# Patient Record
Sex: Female | Born: 1965 | Race: Black or African American | Hispanic: No | Marital: Married | Smoking: Never smoker
Health system: Southern US, Community
[De-identification: ages and names within clinical notes are randomized; demographics above are authoritative.]

## PROBLEM LIST (undated history)

## (undated) DIAGNOSIS — E0590 Thyrotoxicosis, unspecified without thyrotoxic crisis or storm: Secondary | ICD-10-CM

## (undated) DIAGNOSIS — I639 Cerebral infarction, unspecified: Secondary | ICD-10-CM

## (undated) DIAGNOSIS — K638219 Small intestinal bacterial overgrowth, unspecified: Secondary | ICD-10-CM

## (undated) DIAGNOSIS — Z9641 Presence of insulin pump (external) (internal): Secondary | ICD-10-CM

## (undated) DIAGNOSIS — N184 Chronic kidney disease, stage 4 (severe): Secondary | ICD-10-CM

## (undated) DIAGNOSIS — I10 Essential (primary) hypertension: Secondary | ICD-10-CM

## (undated) DIAGNOSIS — N189 Chronic kidney disease, unspecified: Secondary | ICD-10-CM

## (undated) DIAGNOSIS — I5189 Other ill-defined heart diseases: Secondary | ICD-10-CM

## (undated) DIAGNOSIS — R112 Nausea with vomiting, unspecified: Secondary | ICD-10-CM

## (undated) DIAGNOSIS — K219 Gastro-esophageal reflux disease without esophagitis: Secondary | ICD-10-CM

## (undated) DIAGNOSIS — E669 Obesity, unspecified: Secondary | ICD-10-CM

## (undated) DIAGNOSIS — E1140 Type 2 diabetes mellitus with diabetic neuropathy, unspecified: Secondary | ICD-10-CM

## (undated) DIAGNOSIS — E785 Hyperlipidemia, unspecified: Secondary | ICD-10-CM

## (undated) DIAGNOSIS — J329 Chronic sinusitis, unspecified: Secondary | ICD-10-CM

## (undated) DIAGNOSIS — R251 Tremor, unspecified: Secondary | ICD-10-CM

## (undated) DIAGNOSIS — E119 Type 2 diabetes mellitus without complications: Secondary | ICD-10-CM

## (undated) DIAGNOSIS — M810 Age-related osteoporosis without current pathological fracture: Secondary | ICD-10-CM

## (undated) HISTORY — DX: Type 2 diabetes mellitus without complications: E11.9

## (undated) HISTORY — PX: CATARACT EXTRACTION: SUR2

## (undated) HISTORY — DX: Cerebral infarction, unspecified: I63.9

## (undated) HISTORY — DX: Essential (primary) hypertension: I10

## (undated) HISTORY — DX: Small intestinal bacterial overgrowth, unspecified: K63.8219

## (undated) HISTORY — PX: FOOT SURGERY: SHX648

## (undated) HISTORY — DX: Other ill-defined heart diseases: I51.89

## (undated) HISTORY — DX: Chronic kidney disease, stage 4 (severe): N18.4

## (undated) HISTORY — PX: EYE SURGERY: SHX253

---

## 2004-08-06 ENCOUNTER — Ambulatory Visit: Payer: Self-pay | Admitting: Internal Medicine

## 2004-09-06 ENCOUNTER — Ambulatory Visit: Payer: Self-pay | Admitting: Internal Medicine

## 2004-10-06 ENCOUNTER — Ambulatory Visit: Payer: Self-pay | Admitting: Internal Medicine

## 2004-11-06 ENCOUNTER — Ambulatory Visit: Payer: Self-pay | Admitting: Internal Medicine

## 2004-12-07 ENCOUNTER — Ambulatory Visit: Payer: Self-pay | Admitting: Internal Medicine

## 2005-01-04 ENCOUNTER — Ambulatory Visit: Payer: Self-pay | Admitting: Internal Medicine

## 2005-02-04 ENCOUNTER — Ambulatory Visit: Payer: Self-pay | Admitting: Internal Medicine

## 2005-03-06 ENCOUNTER — Ambulatory Visit: Payer: Self-pay | Admitting: Internal Medicine

## 2005-04-06 ENCOUNTER — Ambulatory Visit: Payer: Self-pay | Admitting: Internal Medicine

## 2005-05-06 ENCOUNTER — Ambulatory Visit: Payer: Self-pay | Admitting: Internal Medicine

## 2005-06-06 ENCOUNTER — Ambulatory Visit: Payer: Self-pay | Admitting: Internal Medicine

## 2005-07-07 ENCOUNTER — Ambulatory Visit: Payer: Self-pay | Admitting: Internal Medicine

## 2005-08-06 ENCOUNTER — Ambulatory Visit: Payer: Self-pay | Admitting: Internal Medicine

## 2005-09-06 ENCOUNTER — Ambulatory Visit: Payer: Self-pay | Admitting: Internal Medicine

## 2005-10-06 ENCOUNTER — Ambulatory Visit: Payer: Self-pay | Admitting: Internal Medicine

## 2005-11-06 ENCOUNTER — Ambulatory Visit: Payer: Self-pay | Admitting: Internal Medicine

## 2005-11-06 HISTORY — PX: KIDNEY TRANSPLANT: SHX239

## 2005-11-28 ENCOUNTER — Ambulatory Visit: Payer: Self-pay

## 2005-12-07 ENCOUNTER — Ambulatory Visit: Payer: Self-pay | Admitting: Internal Medicine

## 2006-01-04 ENCOUNTER — Ambulatory Visit: Payer: Self-pay | Admitting: Internal Medicine

## 2006-02-04 ENCOUNTER — Ambulatory Visit: Payer: Self-pay | Admitting: Internal Medicine

## 2006-03-06 ENCOUNTER — Ambulatory Visit: Payer: Self-pay | Admitting: Internal Medicine

## 2006-04-06 ENCOUNTER — Ambulatory Visit: Payer: Self-pay | Admitting: Internal Medicine

## 2006-05-06 ENCOUNTER — Ambulatory Visit: Payer: Self-pay | Admitting: Internal Medicine

## 2006-06-06 ENCOUNTER — Ambulatory Visit: Payer: Self-pay | Admitting: Internal Medicine

## 2006-07-07 ENCOUNTER — Ambulatory Visit: Payer: Self-pay | Admitting: Internal Medicine

## 2006-08-06 ENCOUNTER — Ambulatory Visit: Payer: Self-pay | Admitting: Internal Medicine

## 2006-09-06 ENCOUNTER — Ambulatory Visit: Payer: Self-pay | Admitting: Internal Medicine

## 2006-10-06 ENCOUNTER — Ambulatory Visit: Payer: Self-pay | Admitting: Internal Medicine

## 2006-11-06 ENCOUNTER — Ambulatory Visit: Payer: Self-pay | Admitting: Internal Medicine

## 2007-03-28 ENCOUNTER — Ambulatory Visit: Payer: Self-pay

## 2007-04-29 ENCOUNTER — Emergency Department: Payer: Self-pay | Admitting: Emergency Medicine

## 2008-03-31 ENCOUNTER — Ambulatory Visit: Payer: Self-pay

## 2008-10-30 ENCOUNTER — Emergency Department: Payer: Self-pay

## 2009-04-01 ENCOUNTER — Ambulatory Visit: Payer: Self-pay

## 2009-05-02 ENCOUNTER — Ambulatory Visit: Payer: Self-pay

## 2009-07-22 ENCOUNTER — Ambulatory Visit: Payer: Self-pay | Admitting: Ophthalmology

## 2009-07-28 ENCOUNTER — Ambulatory Visit: Payer: Self-pay | Admitting: Ophthalmology

## 2010-02-03 ENCOUNTER — Other Ambulatory Visit: Payer: Self-pay

## 2010-02-04 ENCOUNTER — Other Ambulatory Visit: Payer: Self-pay

## 2010-07-15 ENCOUNTER — Other Ambulatory Visit: Payer: Self-pay

## 2010-11-12 ENCOUNTER — Other Ambulatory Visit: Payer: Self-pay

## 2010-11-26 ENCOUNTER — Other Ambulatory Visit: Payer: Self-pay

## 2010-12-07 ENCOUNTER — Other Ambulatory Visit: Payer: Self-pay

## 2011-01-05 ENCOUNTER — Other Ambulatory Visit: Payer: Self-pay

## 2011-04-06 ENCOUNTER — Ambulatory Visit: Payer: Self-pay

## 2011-05-13 ENCOUNTER — Other Ambulatory Visit: Payer: Self-pay

## 2011-06-07 ENCOUNTER — Other Ambulatory Visit: Payer: Self-pay

## 2011-08-19 ENCOUNTER — Other Ambulatory Visit: Payer: Self-pay

## 2011-09-07 ENCOUNTER — Other Ambulatory Visit: Payer: Self-pay

## 2011-11-06 ENCOUNTER — Other Ambulatory Visit: Payer: Self-pay

## 2011-11-07 ENCOUNTER — Other Ambulatory Visit: Payer: Self-pay

## 2011-12-30 ENCOUNTER — Other Ambulatory Visit: Payer: Self-pay

## 2011-12-30 LAB — CBC WITH DIFFERENTIAL/PLATELET
Basophil #: 0 10*3/uL (ref 0.0–0.1)
Eosinophil #: 0.2 10*3/uL (ref 0.0–0.7)
Lymphocyte %: 29.4 %
MCHC: 32.7 g/dL (ref 32.0–36.0)
Neutrophil %: 58.9 %
Platelet: 182 10*3/uL (ref 150–440)
RDW: 14.5 % (ref 11.5–14.5)

## 2011-12-30 LAB — BASIC METABOLIC PANEL
Anion Gap: 9 (ref 7–16)
BUN: 33 mg/dL — ABNORMAL HIGH (ref 7–18)
Co2: 25 mmol/L (ref 21–32)
Creatinine: 1.36 mg/dL — ABNORMAL HIGH (ref 0.60–1.30)
EGFR (African American): 54 — ABNORMAL LOW
EGFR (Non-African Amer.): 44 — ABNORMAL LOW
Glucose: 111 mg/dL — ABNORMAL HIGH (ref 65–99)
Osmolality: 285 (ref 275–301)
Sodium: 139 mmol/L (ref 136–145)

## 2011-12-30 LAB — MAGNESIUM: Magnesium: 1.5 mg/dL — ABNORMAL LOW

## 2012-01-05 ENCOUNTER — Other Ambulatory Visit: Payer: Self-pay

## 2012-02-19 ENCOUNTER — Ambulatory Visit: Payer: Self-pay | Admitting: Unknown Physician Specialty

## 2012-02-24 ENCOUNTER — Other Ambulatory Visit: Payer: Self-pay

## 2012-02-24 LAB — CBC WITH DIFFERENTIAL/PLATELET
Basophil %: 0.4 %
Eosinophil #: 0.2 10*3/uL (ref 0.0–0.7)
Eosinophil %: 4 %
Lymphocyte #: 1.2 10*3/uL (ref 1.0–3.6)
MCH: 27.7 pg (ref 26.0–34.0)
MCHC: 31.3 g/dL — ABNORMAL LOW (ref 32.0–36.0)
MCV: 88 fL (ref 80–100)
Monocyte #: 0.3 x10 3/mm (ref 0.2–0.9)
Neutrophil %: 68 %
Platelet: 164 10*3/uL (ref 150–440)
RBC: 3.95 10*6/uL (ref 3.80–5.20)
RDW: 13.6 % (ref 11.5–14.5)
WBC: 5.5 10*3/uL (ref 3.6–11.0)

## 2012-02-24 LAB — BASIC METABOLIC PANEL
Anion Gap: 9 (ref 7–16)
Chloride: 101 mmol/L (ref 98–107)
EGFR (African American): 53 — ABNORMAL LOW
EGFR (Non-African Amer.): 46 — ABNORMAL LOW
Glucose: 158 mg/dL — ABNORMAL HIGH (ref 65–99)
Potassium: 4.6 mmol/L (ref 3.5–5.1)

## 2012-02-24 LAB — PHOSPHORUS: Phosphorus: 3.3 mg/dL (ref 2.5–4.9)

## 2012-02-24 LAB — MAGNESIUM: Magnesium: 1.7 mg/dL — ABNORMAL LOW

## 2012-03-06 ENCOUNTER — Ambulatory Visit: Payer: Self-pay | Admitting: Unknown Physician Specialty

## 2012-03-06 ENCOUNTER — Other Ambulatory Visit: Payer: Self-pay

## 2012-04-06 ENCOUNTER — Ambulatory Visit: Payer: Self-pay | Admitting: Unknown Physician Specialty

## 2012-04-27 LAB — BASIC METABOLIC PANEL
BUN: 27 mg/dL — ABNORMAL HIGH (ref 7–18)
Calcium, Total: 9 mg/dL (ref 8.5–10.1)
Chloride: 105 mmol/L (ref 98–107)
Co2: 24 mmol/L (ref 21–32)
Creatinine: 1.36 mg/dL — ABNORMAL HIGH (ref 0.60–1.30)
EGFR (African American): 54 — ABNORMAL LOW
EGFR (Non-African Amer.): 47 — ABNORMAL LOW
Glucose: 102 mg/dL — ABNORMAL HIGH (ref 65–99)
Osmolality: 285 (ref 275–301)
Potassium: 4.6 mmol/L (ref 3.5–5.1)

## 2012-04-27 LAB — CBC WITH DIFFERENTIAL/PLATELET
Basophil #: 0 10*3/uL (ref 0.0–0.1)
Basophil %: 0.5 %
Eosinophil %: 3.7 %
HGB: 11.4 g/dL — ABNORMAL LOW (ref 12.0–16.0)
Lymphocyte #: 1.5 10*3/uL (ref 1.0–3.6)
Lymphocyte %: 18.4 %
MCH: 28.3 pg (ref 26.0–34.0)
Monocyte #: 0.5 x10 3/mm (ref 0.2–0.9)
Monocyte %: 6.3 %
Neutrophil %: 71.1 %
Platelet: 182 10*3/uL (ref 150–440)
RBC: 4.03 10*6/uL (ref 3.80–5.20)
WBC: 8.2 10*3/uL (ref 3.6–11.0)

## 2012-04-27 LAB — PHOSPHORUS: Phosphorus: 3.3 mg/dL (ref 2.5–4.9)

## 2012-04-27 LAB — MAGNESIUM: Magnesium: 1.4 mg/dL — ABNORMAL LOW

## 2012-05-06 ENCOUNTER — Ambulatory Visit: Payer: Self-pay | Admitting: Unknown Physician Specialty

## 2012-06-05 ENCOUNTER — Ambulatory Visit: Payer: Self-pay

## 2012-06-18 ENCOUNTER — Ambulatory Visit: Payer: Self-pay

## 2012-08-10 ENCOUNTER — Other Ambulatory Visit: Payer: Self-pay

## 2012-08-10 LAB — BASIC METABOLIC PANEL
Co2: 25 mmol/L (ref 21–32)
Creatinine: 1.65 mg/dL — ABNORMAL HIGH (ref 0.60–1.30)
EGFR (African American): 43 — ABNORMAL LOW
EGFR (Non-African Amer.): 37 — ABNORMAL LOW
Glucose: 129 mg/dL — ABNORMAL HIGH (ref 65–99)
Potassium: 4.5 mmol/L (ref 3.5–5.1)
Sodium: 142 mmol/L (ref 136–145)

## 2012-08-10 LAB — CBC WITH DIFFERENTIAL/PLATELET
Basophil #: 0.1 10*3/uL (ref 0.0–0.1)
Basophil %: 1 %
Eosinophil #: 0.2 10*3/uL (ref 0.0–0.7)
HGB: 11.3 g/dL — ABNORMAL LOW (ref 12.0–16.0)
Lymphocyte %: 26.9 %
MCHC: 32.9 g/dL (ref 32.0–36.0)
Neutrophil %: 62 %
RDW: 13.4 % (ref 11.5–14.5)

## 2012-08-10 LAB — PHOSPHORUS: Phosphorus: 3.2 mg/dL (ref 2.5–4.9)

## 2012-08-10 LAB — MAGNESIUM: Magnesium: 1.5 mg/dL — ABNORMAL LOW

## 2012-08-17 ENCOUNTER — Other Ambulatory Visit: Payer: Self-pay

## 2012-08-17 LAB — BASIC METABOLIC PANEL
Calcium, Total: 9.1 mg/dL (ref 8.5–10.1)
Chloride: 103 mmol/L (ref 98–107)
Co2: 25 mmol/L (ref 21–32)
Osmolality: 275 (ref 275–301)
Potassium: 4.5 mmol/L (ref 3.5–5.1)
Sodium: 136 mmol/L (ref 136–145)

## 2012-09-06 ENCOUNTER — Other Ambulatory Visit: Payer: Self-pay

## 2012-10-17 ENCOUNTER — Other Ambulatory Visit: Payer: Self-pay | Admitting: Nephrology

## 2012-10-17 LAB — CBC WITH DIFFERENTIAL/PLATELET
Basophil #: 0 10*3/uL (ref 0.0–0.1)
Eosinophil %: 2.3 %
HCT: 35.7 % (ref 35.0–47.0)
Lymphocyte #: 1.3 10*3/uL (ref 1.0–3.6)
MCH: 28.7 pg (ref 26.0–34.0)
MCHC: 33.1 g/dL (ref 32.0–36.0)
MCV: 87 fL (ref 80–100)
Monocyte #: 0.3 x10 3/mm (ref 0.2–0.9)
Monocyte %: 6.4 %
Neutrophil %: 66.6 %
Platelet: 190 10*3/uL (ref 150–440)
RDW: 14.2 % (ref 11.5–14.5)

## 2012-10-17 LAB — COMPREHENSIVE METABOLIC PANEL
Bilirubin,Total: 0.8 mg/dL (ref 0.2–1.0)
Calcium, Total: 9.2 mg/dL (ref 8.5–10.1)
Chloride: 103 mmol/L (ref 98–107)
Co2: 23 mmol/L (ref 21–32)
Creatinine: 1.51 mg/dL — ABNORMAL HIGH (ref 0.60–1.30)
EGFR (African American): 48 — ABNORMAL LOW
EGFR (Non-African Amer.): 41 — ABNORMAL LOW
Osmolality: 280 (ref 275–301)
SGPT (ALT): 20 U/L (ref 12–78)
Sodium: 134 mmol/L — ABNORMAL LOW (ref 136–145)

## 2012-10-17 LAB — GAMMA GT: GGT: 41 U/L (ref 5–85)

## 2012-10-17 LAB — LIPID PANEL: Triglycerides: 78 mg/dL (ref 0–200)

## 2012-11-06 ENCOUNTER — Other Ambulatory Visit: Payer: Self-pay | Admitting: Nephrology

## 2012-12-03 LAB — CBC WITH DIFFERENTIAL/PLATELET
Basophil #: 0.1 10*3/uL (ref 0.0–0.1)
Eosinophil %: 1.8 %
HGB: 11.9 g/dL — ABNORMAL LOW (ref 12.0–16.0)
Lymphocyte %: 20.6 %
MCV: 87 fL (ref 80–100)
Monocyte #: 0.5 x10 3/mm (ref 0.2–0.9)
Monocyte %: 5.8 %
Neutrophil #: 5.5 10*3/uL (ref 1.4–6.5)
Neutrophil %: 70.7 %
Platelet: 205 10*3/uL (ref 150–440)
RBC: 4.23 10*6/uL (ref 3.80–5.20)

## 2012-12-03 LAB — BASIC METABOLIC PANEL
Anion Gap: 9 (ref 7–16)
Calcium, Total: 8.7 mg/dL (ref 8.5–10.1)
Chloride: 103 mmol/L (ref 98–107)
Creatinine: 1.47 mg/dL — ABNORMAL HIGH (ref 0.60–1.30)
EGFR (African American): 49 — ABNORMAL LOW
EGFR (Non-African Amer.): 42 — ABNORMAL LOW
Potassium: 4.3 mmol/L (ref 3.5–5.1)
Sodium: 135 mmol/L — ABNORMAL LOW (ref 136–145)

## 2012-12-03 LAB — MAGNESIUM: Magnesium: 1.6 mg/dL — ABNORMAL LOW

## 2012-12-07 ENCOUNTER — Other Ambulatory Visit: Payer: Self-pay | Admitting: Nephrology

## 2013-03-01 ENCOUNTER — Other Ambulatory Visit: Payer: Self-pay | Admitting: Nephrology

## 2013-03-01 LAB — CBC WITH DIFFERENTIAL/PLATELET
Basophil #: 0 10*3/uL (ref 0.0–0.1)
Eosinophil #: 0.1 10*3/uL (ref 0.0–0.7)
Eosinophil %: 2.4 %
HCT: 35.8 % (ref 35.0–47.0)
HGB: 11.6 g/dL — ABNORMAL LOW (ref 12.0–16.0)
Lymphocyte #: 1.3 10*3/uL (ref 1.0–3.6)
Lymphocyte %: 21.1 %
MCH: 27.9 pg (ref 26.0–34.0)
MCV: 86 fL (ref 80–100)
Monocyte #: 0.4 x10 3/mm (ref 0.2–0.9)
Monocyte %: 6.1 %
Neutrophil #: 4.3 10*3/uL (ref 1.4–6.5)
Neutrophil %: 69.9 %
RBC: 4.15 10*6/uL (ref 3.80–5.20)
RDW: 14 % (ref 11.5–14.5)
WBC: 6.2 10*3/uL (ref 3.6–11.0)

## 2013-03-01 LAB — COMPREHENSIVE METABOLIC PANEL
Alkaline Phosphatase: 97 U/L (ref 50–136)
Anion Gap: 6 — ABNORMAL LOW (ref 7–16)
BUN: 40 mg/dL — ABNORMAL HIGH (ref 7–18)
Calcium, Total: 9 mg/dL (ref 8.5–10.1)
Co2: 26 mmol/L (ref 21–32)
Creatinine: 1.7 mg/dL — ABNORMAL HIGH (ref 0.60–1.30)
EGFR (Non-African Amer.): 35 — ABNORMAL LOW
Glucose: 107 mg/dL — ABNORMAL HIGH (ref 65–99)
Osmolality: 282 (ref 275–301)
SGPT (ALT): 19 U/L (ref 12–78)

## 2013-03-01 LAB — GAMMA GT: GGT: 41 U/L (ref 5–85)

## 2013-03-01 LAB — LIPID PANEL: Ldl Cholesterol, Calc: 121 mg/dL — ABNORMAL HIGH (ref 0–100)

## 2013-03-22 ENCOUNTER — Other Ambulatory Visit: Payer: Self-pay | Admitting: Nephrology

## 2013-03-22 LAB — BASIC METABOLIC PANEL
Anion Gap: 6 — ABNORMAL LOW (ref 7–16)
BUN: 31 mg/dL — ABNORMAL HIGH (ref 7–18)
Co2: 26 mmol/L (ref 21–32)
EGFR (Non-African Amer.): 36 — ABNORMAL LOW
Glucose: 104 mg/dL — ABNORMAL HIGH (ref 65–99)
Potassium: 4.6 mmol/L (ref 3.5–5.1)
Sodium: 136 mmol/L (ref 136–145)

## 2013-03-22 LAB — CBC WITH DIFFERENTIAL/PLATELET
Basophil #: 0.1 10*3/uL (ref 0.0–0.1)
Basophil %: 1.1 %
Eosinophil #: 0.1 10*3/uL (ref 0.0–0.7)
Eosinophil %: 2.4 %
HCT: 33.2 % — ABNORMAL LOW (ref 35.0–47.0)
HGB: 10.9 g/dL — ABNORMAL LOW (ref 12.0–16.0)
Lymphocyte #: 1.2 10*3/uL (ref 1.0–3.6)
Lymphocyte %: 19.4 %
MCHC: 32.8 g/dL (ref 32.0–36.0)
MCV: 85 fL (ref 80–100)
Neutrophil #: 4.4 10*3/uL (ref 1.4–6.5)
Neutrophil %: 70.3 %
Platelet: 169 10*3/uL (ref 150–440)
RBC: 3.9 10*6/uL (ref 3.80–5.20)
RDW: 13.6 % (ref 11.5–14.5)

## 2013-03-22 LAB — PHOSPHORUS: Phosphorus: 3.1 mg/dL (ref 2.5–4.9)

## 2013-04-06 ENCOUNTER — Other Ambulatory Visit: Payer: Self-pay | Admitting: Nephrology

## 2013-04-12 ENCOUNTER — Other Ambulatory Visit: Payer: Self-pay | Admitting: Nephrology

## 2013-04-12 LAB — CBC WITH DIFFERENTIAL/PLATELET
Basophil %: 1.2 %
Eosinophil %: 2.2 %
Lymphocyte #: 1.3 10*3/uL (ref 1.0–3.6)
MCV: 85 fL (ref 80–100)
Monocyte #: 0.4 x10 3/mm (ref 0.2–0.9)
Neutrophil %: 66.5 %
RBC: 4.19 10*6/uL (ref 3.80–5.20)
RDW: 13.7 % (ref 11.5–14.5)
WBC: 5.4 10*3/uL (ref 3.6–11.0)

## 2013-04-12 LAB — LIPID PANEL
Ldl Cholesterol, Calc: 127 mg/dL — ABNORMAL HIGH (ref 0–100)
Triglycerides: 67 mg/dL (ref 0–200)

## 2013-04-12 LAB — BASIC METABOLIC PANEL
BUN: 34 mg/dL — ABNORMAL HIGH (ref 7–18)
Chloride: 106 mmol/L (ref 98–107)
Co2: 25 mmol/L (ref 21–32)
EGFR (Non-African Amer.): 40 — ABNORMAL LOW
Sodium: 136 mmol/L (ref 136–145)

## 2013-04-12 LAB — MAGNESIUM: Magnesium: 1.5 mg/dL — ABNORMAL LOW

## 2013-04-12 LAB — HEPATIC FUNCTION PANEL A (ARMC)
Bilirubin,Total: 0.7 mg/dL (ref 0.2–1.0)
SGOT(AST): 13 U/L — ABNORMAL LOW (ref 15–37)
Total Protein: 8.1 g/dL (ref 6.4–8.2)

## 2013-05-31 ENCOUNTER — Other Ambulatory Visit: Payer: Self-pay | Admitting: Nephrology

## 2013-05-31 LAB — CBC WITH DIFFERENTIAL/PLATELET
Basophil %: 0.7 %
Eosinophil #: 0.2 10*3/uL (ref 0.0–0.7)
Eosinophil %: 3.3 %
Lymphocyte #: 1.5 10*3/uL (ref 1.0–3.6)
Lymphocyte %: 28.5 %
MCH: 26.1 pg (ref 26.0–34.0)
Monocyte %: 7.9 %
Neutrophil #: 3 10*3/uL (ref 1.4–6.5)
Neutrophil %: 59.6 %
RBC: 3.91 10*6/uL (ref 3.80–5.20)
RDW: 12.8 % (ref 11.5–14.5)
WBC: 5.1 10*3/uL (ref 3.6–11.0)

## 2013-05-31 LAB — BASIC METABOLIC PANEL
Anion Gap: 6 — ABNORMAL LOW (ref 7–16)
Chloride: 102 mmol/L (ref 98–107)
EGFR (African American): 50 — ABNORMAL LOW
Osmolality: 272 (ref 275–301)
Sodium: 134 mmol/L — ABNORMAL LOW (ref 136–145)

## 2013-05-31 LAB — PHOSPHORUS: Phosphorus: 3.5 mg/dL (ref 2.5–4.9)

## 2013-05-31 LAB — MAGNESIUM: Magnesium: 1.5 mg/dL — ABNORMAL LOW

## 2013-06-06 ENCOUNTER — Other Ambulatory Visit: Payer: Self-pay | Admitting: Nephrology

## 2013-07-07 ENCOUNTER — Other Ambulatory Visit: Payer: Self-pay | Admitting: Nephrology

## 2013-07-07 LAB — HEPATIC FUNCTION PANEL A (ARMC)
Alkaline Phosphatase: 120 U/L (ref 50–136)
SGOT(AST): 19 U/L (ref 15–37)
SGPT (ALT): 19 U/L (ref 12–78)
Total Protein: 7.9 g/dL (ref 6.4–8.2)

## 2013-07-07 LAB — CBC WITH DIFFERENTIAL/PLATELET
Basophil #: 0 10*3/uL (ref 0.0–0.1)
Basophil %: 0.6 %
Eosinophil #: 0.1 10*3/uL (ref 0.0–0.7)
Lymphocyte #: 1.4 10*3/uL (ref 1.0–3.6)
Lymphocyte %: 27.5 %
MCV: 85 fL (ref 80–100)
Monocyte #: 0.4 x10 3/mm (ref 0.2–0.9)
Monocyte %: 7.8 %
Platelet: 189 10*3/uL (ref 150–440)
RBC: 3.98 10*6/uL (ref 3.80–5.20)
RDW: 13.9 % (ref 11.5–14.5)

## 2013-07-07 LAB — LIPID PANEL
Cholesterol: 201 mg/dL — ABNORMAL HIGH (ref 0–200)
Ldl Cholesterol, Calc: 116 mg/dL — ABNORMAL HIGH (ref 0–100)
Triglycerides: 103 mg/dL (ref 0–200)

## 2013-07-07 LAB — BASIC METABOLIC PANEL
Anion Gap: 4 — ABNORMAL LOW (ref 7–16)
Calcium, Total: 9 mg/dL (ref 8.5–10.1)
Creatinine: 1.52 mg/dL — ABNORMAL HIGH (ref 0.60–1.30)
EGFR (African American): 47 — ABNORMAL LOW
EGFR (Non-African Amer.): 40 — ABNORMAL LOW
Osmolality: 271 (ref 275–301)
Potassium: 4.5 mmol/L (ref 3.5–5.1)
Sodium: 132 mmol/L — ABNORMAL LOW (ref 136–145)

## 2013-07-07 LAB — MAGNESIUM: Magnesium: 1.5 mg/dL — ABNORMAL LOW

## 2013-07-07 LAB — PHOSPHORUS: Phosphorus: 3.2 mg/dL (ref 2.5–4.9)

## 2013-07-07 LAB — GAMMA GT: GGT: 38 U/L (ref 5–85)

## 2013-07-09 ENCOUNTER — Ambulatory Visit: Payer: Self-pay

## 2013-08-06 ENCOUNTER — Other Ambulatory Visit: Payer: Self-pay | Admitting: Nephrology

## 2013-08-30 ENCOUNTER — Other Ambulatory Visit: Payer: Self-pay | Admitting: Nephrology

## 2013-08-30 LAB — CBC WITH DIFFERENTIAL/PLATELET
Basophil #: 0.1 10*3/uL (ref 0.0–0.1)
Basophil %: 1.2 %
HGB: 11.4 g/dL — ABNORMAL LOW (ref 12.0–16.0)
Lymphocyte %: 25.1 %
MCH: 27.9 pg (ref 26.0–34.0)
Monocyte %: 7.3 %
Platelet: 213 10*3/uL (ref 150–440)
RBC: 4.09 10*6/uL (ref 3.80–5.20)

## 2013-08-30 LAB — BASIC METABOLIC PANEL
Anion Gap: 4 — ABNORMAL LOW (ref 7–16)
Calcium, Total: 9 mg/dL (ref 8.5–10.1)
Chloride: 103 mmol/L (ref 98–107)
Co2: 26 mmol/L (ref 21–32)
Creatinine: 1.63 mg/dL — ABNORMAL HIGH (ref 0.60–1.30)
EGFR (African American): 43 — ABNORMAL LOW
EGFR (Non-African Amer.): 37 — ABNORMAL LOW
Osmolality: 276 (ref 275–301)
Potassium: 4.2 mmol/L (ref 3.5–5.1)
Sodium: 133 mmol/L — ABNORMAL LOW (ref 136–145)

## 2013-08-30 LAB — PHOSPHORUS: Phosphorus: 3.8 mg/dL (ref 2.5–4.9)

## 2013-09-27 ENCOUNTER — Other Ambulatory Visit: Payer: Self-pay | Admitting: Nephrology

## 2013-09-27 LAB — CBC WITH DIFFERENTIAL/PLATELET
Basophil %: 0.6 %
Eosinophil #: 0.2 10*3/uL (ref 0.0–0.7)
Eosinophil %: 2.4 %
HGB: 11.1 g/dL — ABNORMAL LOW (ref 12.0–16.0)
Lymphocyte #: 1.5 10*3/uL (ref 1.0–3.6)
Lymphocyte %: 21.6 %
Monocyte %: 7.4 %
Neutrophil %: 68 %

## 2013-09-27 LAB — BASIC METABOLIC PANEL
Anion Gap: 10 (ref 7–16)
Chloride: 103 mmol/L (ref 98–107)
Co2: 21 mmol/L (ref 21–32)
EGFR (African American): 46 — ABNORMAL LOW
Osmolality: 278 (ref 275–301)
Sodium: 134 mmol/L — ABNORMAL LOW (ref 136–145)

## 2013-10-06 ENCOUNTER — Other Ambulatory Visit: Payer: Self-pay | Admitting: Nephrology

## 2013-10-25 ENCOUNTER — Other Ambulatory Visit: Payer: Self-pay | Admitting: Nephrology

## 2013-10-25 LAB — CBC WITH DIFFERENTIAL/PLATELET
Basophil #: 0 10*3/uL (ref 0.0–0.1)
Eosinophil %: 1.7 %
HCT: 33.7 % — ABNORMAL LOW (ref 35.0–47.0)
HGB: 11.1 g/dL — ABNORMAL LOW (ref 12.0–16.0)
Lymphocyte %: 20 %
MCH: 27.9 pg (ref 26.0–34.0)
MCV: 85 fL (ref 80–100)
Monocyte #: 0.3 x10 3/mm (ref 0.2–0.9)
Monocyte %: 5.9 %
Neutrophil #: 4.3 10*3/uL (ref 1.4–6.5)
Neutrophil %: 71.9 %
Platelet: 166 10*3/uL (ref 150–440)
RDW: 13.7 % (ref 11.5–14.5)
WBC: 6 10*3/uL (ref 3.6–11.0)

## 2013-10-25 LAB — BASIC METABOLIC PANEL
Chloride: 101 mmol/L (ref 98–107)
Creatinine: 1.63 mg/dL — ABNORMAL HIGH (ref 0.60–1.30)
EGFR (African American): 43 — ABNORMAL LOW
EGFR (Non-African Amer.): 37 — ABNORMAL LOW
Glucose: 218 mg/dL — ABNORMAL HIGH (ref 65–99)
Osmolality: 281 (ref 275–301)

## 2013-10-25 LAB — HEPATIC FUNCTION PANEL A (ARMC)
Albumin: 4.1 g/dL (ref 3.4–5.0)
Bilirubin,Total: 0.7 mg/dL (ref 0.2–1.0)
SGOT(AST): 14 U/L — ABNORMAL LOW (ref 15–37)
SGPT (ALT): 17 U/L (ref 12–78)
Total Protein: 7.8 g/dL (ref 6.4–8.2)

## 2013-10-25 LAB — LIPID PANEL: HDL Cholesterol: 65 mg/dL — ABNORMAL HIGH (ref 40–60)

## 2013-10-25 LAB — MAGNESIUM: Magnesium: 1.4 mg/dL — ABNORMAL LOW

## 2013-11-15 ENCOUNTER — Other Ambulatory Visit: Payer: Self-pay | Admitting: Nephrology

## 2013-11-15 LAB — CBC WITH DIFFERENTIAL/PLATELET
BASOS ABS: 0 10*3/uL (ref 0.0–0.1)
Basophil %: 0.6 %
EOS PCT: 2 %
Eosinophil #: 0.1 10*3/uL (ref 0.0–0.7)
HCT: 34.6 % — AB (ref 35.0–47.0)
HGB: 11.5 g/dL — ABNORMAL LOW (ref 12.0–16.0)
LYMPHS ABS: 1.4 10*3/uL (ref 1.0–3.6)
Lymphocyte %: 24.9 %
MCH: 28.3 pg (ref 26.0–34.0)
MCHC: 33.2 g/dL (ref 32.0–36.0)
MCV: 85 fL (ref 80–100)
Monocyte #: 0.5 x10 3/mm (ref 0.2–0.9)
Monocyte %: 8.7 %
Neutrophil #: 3.6 10*3/uL (ref 1.4–6.5)
Neutrophil %: 63.8 %
Platelet: 201 10*3/uL (ref 150–440)
RBC: 4.06 10*6/uL (ref 3.80–5.20)
RDW: 14.1 % (ref 11.5–14.5)
WBC: 5.7 10*3/uL (ref 3.6–11.0)

## 2013-11-15 LAB — BASIC METABOLIC PANEL
Anion Gap: 6 — ABNORMAL LOW (ref 7–16)
BUN: 31 mg/dL — AB (ref 7–18)
CHLORIDE: 105 mmol/L (ref 98–107)
CREATININE: 1.71 mg/dL — AB (ref 0.60–1.30)
Calcium, Total: 9.2 mg/dL (ref 8.5–10.1)
Co2: 24 mmol/L (ref 21–32)
EGFR (African American): 41 — ABNORMAL LOW
EGFR (Non-African Amer.): 35 — ABNORMAL LOW
GLUCOSE: 129 mg/dL — AB (ref 65–99)
Osmolality: 278 (ref 275–301)
POTASSIUM: 4.6 mmol/L (ref 3.5–5.1)
Sodium: 135 mmol/L — ABNORMAL LOW (ref 136–145)

## 2013-11-29 LAB — BASIC METABOLIC PANEL
Anion Gap: 4 — ABNORMAL LOW (ref 7–16)
BUN: 33 mg/dL — ABNORMAL HIGH (ref 7–18)
CHLORIDE: 103 mmol/L (ref 98–107)
CREATININE: 1.67 mg/dL — AB (ref 0.60–1.30)
Calcium, Total: 8.9 mg/dL (ref 8.5–10.1)
Co2: 27 mmol/L (ref 21–32)
EGFR (Non-African Amer.): 36 — ABNORMAL LOW
GFR CALC AF AMER: 42 — AB
Glucose: 77 mg/dL (ref 65–99)
OSMOLALITY: 274 (ref 275–301)
Potassium: 4.4 mmol/L (ref 3.5–5.1)
SODIUM: 134 mmol/L — AB (ref 136–145)

## 2013-11-29 LAB — CBC WITH DIFFERENTIAL/PLATELET
BASOS PCT: 0.9 %
Basophil #: 0.1 10*3/uL (ref 0.0–0.1)
EOS ABS: 0.2 10*3/uL (ref 0.0–0.7)
Eosinophil %: 3.2 %
HCT: 34.1 % — AB (ref 35.0–47.0)
HGB: 11.1 g/dL — AB (ref 12.0–16.0)
LYMPHS PCT: 28.5 %
Lymphocyte #: 1.6 10*3/uL (ref 1.0–3.6)
MCH: 27.7 pg (ref 26.0–34.0)
MCHC: 32.4 g/dL (ref 32.0–36.0)
MCV: 86 fL (ref 80–100)
Monocyte #: 0.4 x10 3/mm (ref 0.2–0.9)
Monocyte %: 8 %
NEUTROS ABS: 3.2 10*3/uL (ref 1.4–6.5)
NEUTROS PCT: 59.4 %
Platelet: 192 10*3/uL (ref 150–440)
RBC: 3.99 10*6/uL (ref 3.80–5.20)
RDW: 14 % (ref 11.5–14.5)
WBC: 5.5 10*3/uL (ref 3.6–11.0)

## 2013-11-29 LAB — PHOSPHORUS: Phosphorus: 3.5 mg/dL (ref 2.5–4.9)

## 2013-11-29 LAB — MAGNESIUM: Magnesium: 1.4 mg/dL — ABNORMAL LOW

## 2013-12-07 ENCOUNTER — Other Ambulatory Visit: Payer: Self-pay | Admitting: Nephrology

## 2013-12-20 LAB — CBC WITH DIFFERENTIAL/PLATELET
BASOS PCT: 0.8 %
Basophil #: 0 10*3/uL (ref 0.0–0.1)
EOS PCT: 2.6 %
Eosinophil #: 0.2 10*3/uL (ref 0.0–0.7)
HCT: 33.4 % — ABNORMAL LOW (ref 35.0–47.0)
HGB: 10.9 g/dL — ABNORMAL LOW (ref 12.0–16.0)
LYMPHS PCT: 23.5 %
Lymphocyte #: 1.5 10*3/uL (ref 1.0–3.6)
MCH: 28.1 pg (ref 26.0–34.0)
MCHC: 32.7 g/dL (ref 32.0–36.0)
MCV: 86 fL (ref 80–100)
Monocyte #: 0.5 x10 3/mm (ref 0.2–0.9)
Monocyte %: 8.7 %
NEUTROS ABS: 4 10*3/uL (ref 1.4–6.5)
Neutrophil %: 64.4 %
Platelet: 172 10*3/uL (ref 150–440)
RBC: 3.9 10*6/uL (ref 3.80–5.20)
RDW: 14.2 % (ref 11.5–14.5)
WBC: 6.2 10*3/uL (ref 3.6–11.0)

## 2013-12-20 LAB — BASIC METABOLIC PANEL
Anion Gap: 5 — ABNORMAL LOW (ref 7–16)
BUN: 37 mg/dL — ABNORMAL HIGH (ref 7–18)
Calcium, Total: 9 mg/dL (ref 8.5–10.1)
Chloride: 103 mmol/L (ref 98–107)
Co2: 25 mmol/L (ref 21–32)
Creatinine: 1.77 mg/dL — ABNORMAL HIGH (ref 0.60–1.30)
EGFR (African American): 39 — ABNORMAL LOW
EGFR (Non-African Amer.): 33 — ABNORMAL LOW
Glucose: 101 mg/dL — ABNORMAL HIGH (ref 65–99)
Osmolality: 275 (ref 275–301)
Potassium: 4.6 mmol/L (ref 3.5–5.1)
Sodium: 133 mmol/L — ABNORMAL LOW (ref 136–145)

## 2013-12-20 LAB — MAGNESIUM: MAGNESIUM: 1.5 mg/dL — AB

## 2013-12-20 LAB — PHOSPHORUS: Phosphorus: 3.8 mg/dL (ref 2.5–4.9)

## 2014-01-04 ENCOUNTER — Other Ambulatory Visit: Payer: Self-pay | Admitting: Nephrology

## 2014-02-21 ENCOUNTER — Other Ambulatory Visit: Payer: Self-pay | Admitting: Nephrology

## 2014-02-21 LAB — CBC WITH DIFFERENTIAL/PLATELET
BASOS ABS: 0 10*3/uL (ref 0.0–0.1)
BASOS PCT: 0.4 %
EOS ABS: 0.1 10*3/uL (ref 0.0–0.7)
Eosinophil %: 1.8 %
HCT: 36.6 % (ref 35.0–47.0)
HGB: 11.4 g/dL — ABNORMAL LOW (ref 12.0–16.0)
Lymphocyte #: 1.2 10*3/uL (ref 1.0–3.6)
Lymphocyte %: 16.7 %
MCH: 26.9 pg (ref 26.0–34.0)
MCHC: 31.1 g/dL — ABNORMAL LOW (ref 32.0–36.0)
MCV: 86 fL (ref 80–100)
MONO ABS: 0.5 x10 3/mm (ref 0.2–0.9)
Monocyte %: 6.3 %
NEUTROS PCT: 74.8 %
Neutrophil #: 5.3 10*3/uL (ref 1.4–6.5)
PLATELETS: 172 10*3/uL (ref 150–440)
RBC: 4.24 10*6/uL (ref 3.80–5.20)
RDW: 13.7 % (ref 11.5–14.5)
WBC: 7.2 10*3/uL (ref 3.6–11.0)

## 2014-02-21 LAB — BASIC METABOLIC PANEL
Anion Gap: 8 (ref 7–16)
BUN: 24 mg/dL — ABNORMAL HIGH (ref 7–18)
CALCIUM: 9.2 mg/dL (ref 8.5–10.1)
CHLORIDE: 103 mmol/L (ref 98–107)
Co2: 26 mmol/L (ref 21–32)
Creatinine: 1.54 mg/dL — ABNORMAL HIGH (ref 0.60–1.30)
EGFR (Non-African Amer.): 39 — ABNORMAL LOW
GFR CALC AF AMER: 46 — AB
Glucose: 110 mg/dL — ABNORMAL HIGH (ref 65–99)
OSMOLALITY: 279 (ref 275–301)
Potassium: 4.2 mmol/L (ref 3.5–5.1)
Sodium: 137 mmol/L (ref 136–145)

## 2014-02-21 LAB — PHOSPHORUS: Phosphorus: 3.7 mg/dL (ref 2.5–4.9)

## 2014-02-21 LAB — MAGNESIUM: Magnesium: 1.3 mg/dL — ABNORMAL LOW

## 2014-03-06 ENCOUNTER — Other Ambulatory Visit: Payer: Self-pay | Admitting: Nephrology

## 2014-03-28 LAB — BASIC METABOLIC PANEL
ANION GAP: 4 — AB (ref 7–16)
BUN: 30 mg/dL — AB (ref 7–18)
CREATININE: 1.55 mg/dL — AB (ref 0.60–1.30)
Calcium, Total: 8.9 mg/dL (ref 8.5–10.1)
Chloride: 104 mmol/L (ref 98–107)
Co2: 27 mmol/L (ref 21–32)
EGFR (African American): 45 — ABNORMAL LOW
EGFR (Non-African Amer.): 39 — ABNORMAL LOW
GLUCOSE: 115 mg/dL — AB (ref 65–99)
OSMOLALITY: 277 (ref 275–301)
Potassium: 4.4 mmol/L (ref 3.5–5.1)
Sodium: 135 mmol/L — ABNORMAL LOW (ref 136–145)

## 2014-03-28 LAB — MAGNESIUM: Magnesium: 1.8 mg/dL

## 2014-03-28 LAB — CBC WITH DIFFERENTIAL/PLATELET
Basophil #: 0 10*3/uL (ref 0.0–0.1)
Basophil %: 0.4 %
EOS PCT: 3.1 %
Eosinophil #: 0.2 10*3/uL (ref 0.0–0.7)
HCT: 32.6 % — ABNORMAL LOW (ref 35.0–47.0)
HGB: 10.4 g/dL — AB (ref 12.0–16.0)
Lymphocyte #: 1.3 10*3/uL (ref 1.0–3.6)
Lymphocyte %: 24.3 %
MCH: 27.6 pg (ref 26.0–34.0)
MCHC: 32 g/dL (ref 32.0–36.0)
MCV: 86 fL (ref 80–100)
MONOS PCT: 8.5 %
Monocyte #: 0.4 x10 3/mm (ref 0.2–0.9)
Neutrophil #: 3.3 10*3/uL (ref 1.4–6.5)
Neutrophil %: 63.7 %
Platelet: 160 10*3/uL (ref 150–440)
RBC: 3.78 10*6/uL — ABNORMAL LOW (ref 3.80–5.20)
RDW: 13.9 % (ref 11.5–14.5)
WBC: 5.2 10*3/uL (ref 3.6–11.0)

## 2014-03-28 LAB — LIPID PANEL
CHOLESTEROL: 127 mg/dL (ref 0–200)
HDL Cholesterol: 63 mg/dL — ABNORMAL HIGH (ref 40–60)
Ldl Cholesterol, Calc: 53 mg/dL (ref 0–100)
Triglycerides: 56 mg/dL (ref 0–200)
VLDL Cholesterol, Calc: 11 mg/dL (ref 5–40)

## 2014-03-28 LAB — GAMMA GT: GGT: 32 U/L (ref 5–85)

## 2014-03-28 LAB — PHOSPHORUS: Phosphorus: 3.4 mg/dL (ref 2.5–4.9)

## 2014-03-28 LAB — BILIRUBIN, DIRECT: BILIRUBIN DIRECT: 0.2 mg/dL (ref 0.00–0.20)

## 2014-04-06 ENCOUNTER — Other Ambulatory Visit: Payer: Self-pay | Admitting: Nephrology

## 2014-04-25 ENCOUNTER — Other Ambulatory Visit: Payer: Self-pay | Admitting: Nephrology

## 2014-04-25 LAB — BASIC METABOLIC PANEL
Anion Gap: 6 — ABNORMAL LOW (ref 7–16)
BUN: 28 mg/dL — ABNORMAL HIGH (ref 7–18)
CHLORIDE: 104 mmol/L (ref 98–107)
CO2: 25 mmol/L (ref 21–32)
Calcium, Total: 9.1 mg/dL (ref 8.5–10.1)
Creatinine: 1.37 mg/dL — ABNORMAL HIGH (ref 0.60–1.30)
EGFR (African American): 53 — ABNORMAL LOW
EGFR (Non-African Amer.): 46 — ABNORMAL LOW
Glucose: 132 mg/dL — ABNORMAL HIGH (ref 65–99)
Osmolality: 277 (ref 275–301)
POTASSIUM: 4.5 mmol/L (ref 3.5–5.1)
Sodium: 135 mmol/L — ABNORMAL LOW (ref 136–145)

## 2014-04-25 LAB — CBC WITH DIFFERENTIAL/PLATELET
BASOS ABS: 0.1 10*3/uL (ref 0.0–0.1)
Basophil %: 1 %
EOS ABS: 0.2 10*3/uL (ref 0.0–0.7)
Eosinophil %: 3.6 %
HCT: 34.2 % — AB (ref 35.0–47.0)
HGB: 11.1 g/dL — ABNORMAL LOW (ref 12.0–16.0)
Lymphocyte #: 1.3 10*3/uL (ref 1.0–3.6)
Lymphocyte %: 23.9 %
MCH: 28 pg (ref 26.0–34.0)
MCHC: 32.4 g/dL (ref 32.0–36.0)
MCV: 86 fL (ref 80–100)
MONO ABS: 0.5 x10 3/mm (ref 0.2–0.9)
Monocyte %: 8.7 %
NEUTROS ABS: 3.4 10*3/uL (ref 1.4–6.5)
Neutrophil %: 62.8 %
Platelet: 173 10*3/uL (ref 150–440)
RBC: 3.96 10*6/uL (ref 3.80–5.20)
RDW: 13.9 % (ref 11.5–14.5)
WBC: 5.5 10*3/uL (ref 3.6–11.0)

## 2014-04-25 LAB — PHOSPHORUS: PHOSPHORUS: 3.1 mg/dL (ref 2.5–4.9)

## 2014-04-25 LAB — MAGNESIUM: Magnesium: 1.7 mg/dL — ABNORMAL LOW

## 2014-05-22 ENCOUNTER — Other Ambulatory Visit: Payer: Self-pay | Admitting: Nephrology

## 2014-05-22 LAB — CBC WITH DIFFERENTIAL/PLATELET
BASOS ABS: 0 10*3/uL (ref 0.0–0.1)
BASOS PCT: 0.7 %
EOS ABS: 0.1 10*3/uL (ref 0.0–0.7)
Eosinophil %: 2.7 %
HCT: 36 % (ref 35.0–47.0)
HGB: 11.2 g/dL — ABNORMAL LOW (ref 12.0–16.0)
Lymphocyte #: 1.2 10*3/uL (ref 1.0–3.6)
Lymphocyte %: 23.4 %
MCH: 27.1 pg (ref 26.0–34.0)
MCHC: 31.2 g/dL — ABNORMAL LOW (ref 32.0–36.0)
MCV: 87 fL (ref 80–100)
MONO ABS: 0.4 x10 3/mm (ref 0.2–0.9)
MONOS PCT: 8.5 %
NEUTROS ABS: 3.4 10*3/uL (ref 1.4–6.5)
NEUTROS PCT: 64.7 %
PLATELETS: 159 10*3/uL (ref 150–440)
RBC: 4.16 10*6/uL (ref 3.80–5.20)
RDW: 13.8 % (ref 11.5–14.5)
WBC: 5.2 10*3/uL (ref 3.6–11.0)

## 2014-05-22 LAB — COMPREHENSIVE METABOLIC PANEL
ALK PHOS: 112 U/L
AST: 30 U/L (ref 15–37)
Albumin: 3.9 g/dL (ref 3.4–5.0)
Anion Gap: 4 — ABNORMAL LOW (ref 7–16)
BUN: 33 mg/dL — ABNORMAL HIGH (ref 7–18)
Bilirubin,Total: 0.9 mg/dL (ref 0.2–1.0)
CHLORIDE: 106 mmol/L (ref 98–107)
CO2: 26 mmol/L (ref 21–32)
Calcium, Total: 8.8 mg/dL (ref 8.5–10.1)
Creatinine: 1.96 mg/dL — ABNORMAL HIGH (ref 0.60–1.30)
GFR CALC AF AMER: 34 — AB
GFR CALC NON AF AMER: 30 — AB
Glucose: 88 mg/dL (ref 65–99)
Osmolality: 279 (ref 275–301)
POTASSIUM: 4.2 mmol/L (ref 3.5–5.1)
SGPT (ALT): 27 U/L (ref 12–78)
Sodium: 136 mmol/L (ref 136–145)
TOTAL PROTEIN: 7.8 g/dL (ref 6.4–8.2)

## 2014-06-06 ENCOUNTER — Other Ambulatory Visit: Payer: Self-pay | Admitting: Nephrology

## 2014-06-06 LAB — BASIC METABOLIC PANEL
Anion Gap: 8 (ref 7–16)
BUN: 32 mg/dL — AB (ref 7–18)
Calcium, Total: 9 mg/dL (ref 8.5–10.1)
Chloride: 101 mmol/L (ref 98–107)
Co2: 24 mmol/L (ref 21–32)
Creatinine: 1.61 mg/dL — ABNORMAL HIGH (ref 0.60–1.30)
EGFR (African American): 43 — ABNORMAL LOW
GFR CALC NON AF AMER: 37 — AB
Glucose: 94 mg/dL (ref 65–99)
Osmolality: 273 (ref 275–301)
Potassium: 4.2 mmol/L (ref 3.5–5.1)
SODIUM: 133 mmol/L — AB (ref 136–145)

## 2014-06-06 LAB — CBC WITH DIFFERENTIAL/PLATELET
Basophil #: 0 10*3/uL (ref 0.0–0.1)
Basophil %: 0.7 %
Eosinophil #: 0.2 10*3/uL (ref 0.0–0.7)
Eosinophil %: 2.9 %
HCT: 36.3 % (ref 35.0–47.0)
HGB: 11.6 g/dL — AB (ref 12.0–16.0)
Lymphocyte #: 1.2 10*3/uL (ref 1.0–3.6)
Lymphocyte %: 21.6 %
MCH: 27.7 pg (ref 26.0–34.0)
MCHC: 31.9 g/dL — ABNORMAL LOW (ref 32.0–36.0)
MCV: 87 fL (ref 80–100)
Monocyte #: 0.4 x10 3/mm (ref 0.2–0.9)
Monocyte %: 7.2 %
NEUTROS PCT: 67.6 %
Neutrophil #: 3.9 10*3/uL (ref 1.4–6.5)
Platelet: 164 10*3/uL (ref 150–440)
RBC: 4.18 10*6/uL (ref 3.80–5.20)
RDW: 14.1 % (ref 11.5–14.5)
WBC: 5.8 10*3/uL (ref 3.6–11.0)

## 2014-06-06 LAB — PHOSPHORUS: Phosphorus: 3.2 mg/dL (ref 2.5–4.9)

## 2014-06-06 LAB — MAGNESIUM: Magnesium: 1.1 mg/dL — ABNORMAL LOW

## 2014-07-07 ENCOUNTER — Other Ambulatory Visit: Payer: Self-pay | Admitting: Nephrology

## 2014-07-18 LAB — CBC WITH DIFFERENTIAL/PLATELET
Basophil #: 0 10*3/uL (ref 0.0–0.1)
Basophil %: 0.8 %
Eosinophil #: 0.2 10*3/uL (ref 0.0–0.7)
Eosinophil %: 3.8 %
HCT: 37 % (ref 35.0–47.0)
HGB: 11.5 g/dL — ABNORMAL LOW (ref 12.0–16.0)
LYMPHS ABS: 1.6 10*3/uL (ref 1.0–3.6)
LYMPHS PCT: 26.8 %
MCH: 27.1 pg (ref 26.0–34.0)
MCHC: 31.2 g/dL — ABNORMAL LOW (ref 32.0–36.0)
MCV: 87 fL (ref 80–100)
MONOS PCT: 8.4 %
Monocyte #: 0.5 x10 3/mm (ref 0.2–0.9)
NEUTROS ABS: 3.5 10*3/uL (ref 1.4–6.5)
Neutrophil %: 60.2 %
PLATELETS: 168 10*3/uL (ref 150–440)
RBC: 4.26 10*6/uL (ref 3.80–5.20)
RDW: 14 % (ref 11.5–14.5)
WBC: 5.9 10*3/uL (ref 3.6–11.0)

## 2014-07-18 LAB — LIPID PANEL
Cholesterol: 129 mg/dL (ref 0–200)
HDL Cholesterol: 67 mg/dL — ABNORMAL HIGH (ref 40–60)
Ldl Cholesterol, Calc: 55 mg/dL (ref 0–100)
TRIGLYCERIDES: 36 mg/dL (ref 0–200)
VLDL Cholesterol, Calc: 7 mg/dL (ref 5–40)

## 2014-07-18 LAB — HEPATIC FUNCTION PANEL A (ARMC)
ALK PHOS: 124 U/L — AB
Albumin: 3.9 g/dL (ref 3.4–5.0)
Bilirubin, Direct: 0.2 mg/dL (ref 0.00–0.20)
Bilirubin,Total: 0.7 mg/dL (ref 0.2–1.0)
SGOT(AST): 25 U/L (ref 15–37)
SGPT (ALT): 44 U/L
Total Protein: 7.7 g/dL (ref 6.4–8.2)

## 2014-07-18 LAB — BASIC METABOLIC PANEL
Anion Gap: 6 — ABNORMAL LOW (ref 7–16)
BUN: 32 mg/dL — AB (ref 7–18)
CALCIUM: 9.2 mg/dL (ref 8.5–10.1)
CO2: 26 mmol/L (ref 21–32)
Chloride: 102 mmol/L (ref 98–107)
Creatinine: 1.57 mg/dL — ABNORMAL HIGH (ref 0.60–1.30)
EGFR (Non-African Amer.): 39 — ABNORMAL LOW
GFR CALC AF AMER: 45 — AB
Glucose: 71 mg/dL (ref 65–99)
OSMOLALITY: 274 (ref 275–301)
Potassium: 4.2 mmol/L (ref 3.5–5.1)
SODIUM: 134 mmol/L — AB (ref 136–145)

## 2014-07-18 LAB — GAMMA GT: GGT: 39 U/L (ref 5–85)

## 2014-07-18 LAB — PHOSPHORUS: Phosphorus: 3.2 mg/dL (ref 2.5–4.9)

## 2014-07-18 LAB — MAGNESIUM: Magnesium: 1.3 mg/dL — ABNORMAL LOW

## 2014-08-06 ENCOUNTER — Encounter: Payer: Self-pay | Admitting: Nephrology

## 2014-08-12 ENCOUNTER — Emergency Department: Payer: Self-pay | Admitting: Emergency Medicine

## 2014-08-12 LAB — CBC WITH DIFFERENTIAL/PLATELET
BASOS ABS: 0 10*3/uL (ref 0.0–0.1)
Basophil %: 0.7 %
EOS PCT: 3.3 %
Eosinophil #: 0.2 10*3/uL (ref 0.0–0.7)
HCT: 39.1 % (ref 35.0–47.0)
HGB: 12.1 g/dL (ref 12.0–16.0)
LYMPHS ABS: 2.8 10*3/uL (ref 1.0–3.6)
LYMPHS PCT: 40.6 %
MCH: 26.6 pg (ref 26.0–34.0)
MCHC: 31 g/dL — ABNORMAL LOW (ref 32.0–36.0)
MCV: 86 fL (ref 80–100)
MONOS PCT: 10.3 %
Monocyte #: 0.7 x10 3/mm (ref 0.2–0.9)
Neutrophil #: 3.1 10*3/uL (ref 1.4–6.5)
Neutrophil %: 45.1 %
Platelet: 186 10*3/uL (ref 150–440)
RBC: 4.54 10*6/uL (ref 3.80–5.20)
RDW: 13.7 % (ref 11.5–14.5)
WBC: 6.9 10*3/uL (ref 3.6–11.0)

## 2014-08-12 LAB — BASIC METABOLIC PANEL
Anion Gap: 7 (ref 7–16)
BUN: 33 mg/dL — AB (ref 7–18)
Calcium, Total: 9.5 mg/dL (ref 8.5–10.1)
Chloride: 109 mmol/L — ABNORMAL HIGH (ref 98–107)
Co2: 24 mmol/L (ref 21–32)
Creatinine: 1.71 mg/dL — ABNORMAL HIGH (ref 0.60–1.30)
Glucose: 21 mg/dL — CL (ref 65–99)
POTASSIUM: 4.1 mmol/L (ref 3.5–5.1)
Sodium: 140 mmol/L (ref 136–145)

## 2014-08-22 LAB — CBC WITH DIFFERENTIAL/PLATELET
BASOS ABS: 0 10*3/uL (ref 0.0–0.1)
Basophil %: 0.6 %
EOS PCT: 3.5 %
Eosinophil #: 0.2 10*3/uL (ref 0.0–0.7)
HCT: 36.9 % (ref 35.0–47.0)
HGB: 11.6 g/dL — AB (ref 12.0–16.0)
Lymphocyte #: 1.2 10*3/uL (ref 1.0–3.6)
Lymphocyte %: 20.1 %
MCH: 26.6 pg (ref 26.0–34.0)
MCHC: 31.5 g/dL — AB (ref 32.0–36.0)
MCV: 85 fL (ref 80–100)
Monocyte #: 0.4 x10 3/mm (ref 0.2–0.9)
Monocyte %: 7.6 %
NEUTROS ABS: 4 10*3/uL (ref 1.4–6.5)
Neutrophil %: 68.2 %
Platelet: 168 10*3/uL (ref 150–440)
RBC: 4.37 10*6/uL (ref 3.80–5.20)
RDW: 13.9 % (ref 11.5–14.5)
WBC: 5.8 10*3/uL (ref 3.6–11.0)

## 2014-08-22 LAB — BASIC METABOLIC PANEL
Anion Gap: 10 (ref 7–16)
BUN: 39 mg/dL — ABNORMAL HIGH (ref 7–18)
CHLORIDE: 103 mmol/L (ref 98–107)
CREATININE: 1.79 mg/dL — AB (ref 0.60–1.30)
Calcium, Total: 8.6 mg/dL (ref 8.5–10.1)
Co2: 24 mmol/L (ref 21–32)
EGFR (Non-African Amer.): 32 — ABNORMAL LOW
GFR CALC AF AMER: 39 — AB
Glucose: 163 mg/dL — ABNORMAL HIGH (ref 65–99)
Osmolality: 287 (ref 275–301)
Potassium: 4.4 mmol/L (ref 3.5–5.1)
SODIUM: 137 mmol/L (ref 136–145)

## 2014-08-22 LAB — PHOSPHORUS: Phosphorus: 3 mg/dL (ref 2.5–4.9)

## 2014-08-22 LAB — MAGNESIUM: Magnesium: 1.4 mg/dL — ABNORMAL LOW

## 2014-08-25 ENCOUNTER — Ambulatory Visit: Payer: Self-pay

## 2014-09-06 ENCOUNTER — Encounter: Payer: Self-pay | Admitting: Nephrology

## 2014-09-26 LAB — PHOSPHORUS: Phosphorus: 3.3 mg/dL (ref 2.5–4.9)

## 2014-09-26 LAB — BASIC METABOLIC PANEL
Anion Gap: 5 — ABNORMAL LOW (ref 7–16)
BUN: 30 mg/dL — ABNORMAL HIGH (ref 7–18)
Calcium, Total: 9.1 mg/dL (ref 8.5–10.1)
Chloride: 102 mmol/L (ref 98–107)
Co2: 28 mmol/L (ref 21–32)
Creatinine: 2 mg/dL — ABNORMAL HIGH (ref 0.60–1.30)
EGFR (Non-African Amer.): 28 — ABNORMAL LOW
GFR CALC AF AMER: 34 — AB
Glucose: 143 mg/dL — ABNORMAL HIGH (ref 65–99)
OSMOLALITY: 279 (ref 275–301)
POTASSIUM: 4.2 mmol/L (ref 3.5–5.1)
Sodium: 135 mmol/L — ABNORMAL LOW (ref 136–145)

## 2014-09-26 LAB — CBC WITH DIFFERENTIAL/PLATELET
Basophil #: 0 10*3/uL (ref 0.0–0.1)
Basophil %: 0.4 %
EOS ABS: 0.1 10*3/uL (ref 0.0–0.7)
EOS PCT: 1.6 %
HCT: 35.4 % (ref 35.0–47.0)
HGB: 11.2 g/dL — ABNORMAL LOW (ref 12.0–16.0)
LYMPHS PCT: 15.2 %
Lymphocyte #: 1.3 10*3/uL (ref 1.0–3.6)
MCH: 27.1 pg (ref 26.0–34.0)
MCHC: 31.7 g/dL — ABNORMAL LOW (ref 32.0–36.0)
MCV: 86 fL (ref 80–100)
MONO ABS: 0.7 x10 3/mm (ref 0.2–0.9)
Monocyte %: 7.8 %
NEUTROS PCT: 75 %
Neutrophil #: 6.3 10*3/uL (ref 1.4–6.5)
PLATELETS: 162 10*3/uL (ref 150–440)
RBC: 4.14 10*6/uL (ref 3.80–5.20)
RDW: 13.5 % (ref 11.5–14.5)
WBC: 8.3 10*3/uL (ref 3.6–11.0)

## 2014-09-26 LAB — MAGNESIUM: MAGNESIUM: 1.4 mg/dL — AB

## 2014-10-06 ENCOUNTER — Encounter: Payer: Self-pay | Admitting: Nephrology

## 2014-10-17 LAB — PHOSPHORUS: Phosphorus: 2.7 mg/dL (ref 2.5–4.9)

## 2014-10-17 LAB — BASIC METABOLIC PANEL
Anion Gap: 5 — ABNORMAL LOW (ref 7–16)
BUN: 30 mg/dL — ABNORMAL HIGH (ref 7–18)
CALCIUM: 8.8 mg/dL (ref 8.5–10.1)
CREATININE: 1.79 mg/dL — AB (ref 0.60–1.30)
Chloride: 106 mmol/L (ref 98–107)
Co2: 25 mmol/L (ref 21–32)
EGFR (African American): 39 — ABNORMAL LOW
EGFR (Non-African Amer.): 32 — ABNORMAL LOW
Glucose: 130 mg/dL — ABNORMAL HIGH (ref 65–99)
Osmolality: 280 (ref 275–301)
Potassium: 4.3 mmol/L (ref 3.5–5.1)
Sodium: 136 mmol/L (ref 136–145)

## 2014-10-17 LAB — CBC WITH DIFFERENTIAL/PLATELET
Basophil #: 0 10*3/uL (ref 0.0–0.1)
Basophil %: 0.8 %
EOS PCT: 3.4 %
Eosinophil #: 0.2 10*3/uL (ref 0.0–0.7)
HCT: 33.4 % — AB (ref 35.0–47.0)
HGB: 10.4 g/dL — ABNORMAL LOW (ref 12.0–16.0)
Lymphocyte #: 1.4 10*3/uL (ref 1.0–3.6)
Lymphocyte %: 27.7 %
MCH: 26.9 pg (ref 26.0–34.0)
MCHC: 31 g/dL — ABNORMAL LOW (ref 32.0–36.0)
MCV: 87 fL (ref 80–100)
MONO ABS: 0.4 x10 3/mm (ref 0.2–0.9)
Monocyte %: 8.5 %
NEUTROS ABS: 3 10*3/uL (ref 1.4–6.5)
Neutrophil %: 59.6 %
PLATELETS: 208 10*3/uL (ref 150–440)
RBC: 3.86 10*6/uL (ref 3.80–5.20)
RDW: 13.9 % (ref 11.5–14.5)
WBC: 5 10*3/uL (ref 3.6–11.0)

## 2014-10-17 LAB — GAMMA GT: GGT: 31 U/L (ref 5–85)

## 2014-10-17 LAB — HEPATIC FUNCTION PANEL A (ARMC)
ALK PHOS: 128 U/L — AB
ALT: 37 U/L
Albumin: 3.7 g/dL (ref 3.4–5.0)
Bilirubin,Total: 0.8 mg/dL (ref 0.2–1.0)
SGOT(AST): 27 U/L (ref 15–37)
Total Protein: 7.4 g/dL (ref 6.4–8.2)

## 2014-10-17 LAB — LIPID PANEL
Cholesterol: 120 mg/dL (ref 0–200)
HDL Cholesterol: 54 mg/dL (ref 40–60)
LDL CHOLESTEROL, CALC: 55 mg/dL (ref 0–100)
Triglycerides: 53 mg/dL (ref 0–200)
VLDL Cholesterol, Calc: 11 mg/dL (ref 5–40)

## 2014-10-17 LAB — BILIRUBIN, DIRECT: BILIRUBIN DIRECT: 0.1 mg/dL (ref 0.0–0.2)

## 2014-10-17 LAB — MAGNESIUM: MAGNESIUM: 1.7 mg/dL — AB

## 2014-11-06 ENCOUNTER — Encounter: Payer: Self-pay | Admitting: Nephrology

## 2014-12-05 ENCOUNTER — Ambulatory Visit: Payer: Self-pay | Admitting: Nephrology

## 2014-12-05 LAB — CBC WITH DIFFERENTIAL/PLATELET
BASOS ABS: 0 10*3/uL (ref 0.0–0.1)
BASOS PCT: 0.5 %
EOS PCT: 5 %
Eosinophil #: 0.3 10*3/uL (ref 0.0–0.7)
HCT: 34 % — AB (ref 35.0–47.0)
HGB: 10.6 g/dL — AB (ref 12.0–16.0)
LYMPHS ABS: 1.5 10*3/uL (ref 1.0–3.6)
Lymphocyte %: 25.7 %
MCH: 26.4 pg (ref 26.0–34.0)
MCHC: 31.1 g/dL — ABNORMAL LOW (ref 32.0–36.0)
MCV: 85 fL (ref 80–100)
MONOS PCT: 8.5 %
Monocyte #: 0.5 x10 3/mm (ref 0.2–0.9)
NEUTROS ABS: 3.5 10*3/uL (ref 1.4–6.5)
Neutrophil %: 60.3 %
Platelet: 163 10*3/uL (ref 150–440)
RBC: 4.01 10*6/uL (ref 3.80–5.20)
RDW: 15.3 % — AB (ref 11.5–14.5)
WBC: 5.8 10*3/uL (ref 3.6–11.0)

## 2014-12-05 LAB — BASIC METABOLIC PANEL
Anion Gap: 4 — ABNORMAL LOW (ref 7–16)
BUN: 37 mg/dL — ABNORMAL HIGH (ref 7–18)
CHLORIDE: 104 mmol/L (ref 98–107)
CO2: 27 mmol/L (ref 21–32)
Calcium, Total: 9 mg/dL (ref 8.5–10.1)
Creatinine: 1.8 mg/dL — ABNORMAL HIGH (ref 0.60–1.30)
EGFR (Non-African Amer.): 32 — ABNORMAL LOW
GFR CALC AF AMER: 39 — AB
GLUCOSE: 104 mg/dL — AB (ref 65–99)
Osmolality: 279 (ref 275–301)
Potassium: 4.3 mmol/L (ref 3.5–5.1)
Sodium: 135 mmol/L — ABNORMAL LOW (ref 136–145)

## 2014-12-05 LAB — PHOSPHORUS: Phosphorus: 3.8 mg/dL (ref 2.5–4.9)

## 2014-12-05 LAB — MAGNESIUM: MAGNESIUM: 1.8 mg/dL

## 2015-01-30 ENCOUNTER — Other Ambulatory Visit: Payer: Self-pay | Admitting: Nephrology

## 2015-01-30 LAB — MAGNESIUM: MAGNESIUM: 1.4 mg/dL — AB

## 2015-01-30 LAB — LIPID PANEL
Cholesterol: 145 mg/dL
HDL Cholesterol: 57 mg/dL
Ldl Cholesterol, Calc: 75 mg/dL
Triglycerides: 65 mg/dL
VLDL CHOLESTEROL, CALC: 13 mg/dL

## 2015-01-30 LAB — HEPATIC FUNCTION PANEL A (ARMC)
ALBUMIN: 4.2 g/dL
ALT: 19 U/L
Alkaline Phosphatase: 90 U/L
Bilirubin,Total: 0.8 mg/dL
Indirect Bilirubin: 0.6
SGOT(AST): 24 U/L
TOTAL PROTEIN: 7.3 g/dL

## 2015-01-30 LAB — CBC WITH DIFFERENTIAL/PLATELET
BASOS ABS: 0 10*3/uL (ref 0.0–0.1)
BASOS PCT: 0.6 %
Eosinophil #: 0.2 10*3/uL (ref 0.0–0.7)
Eosinophil %: 3.4 %
HCT: 31.9 % — AB (ref 35.0–47.0)
HGB: 10.3 g/dL — AB (ref 12.0–16.0)
LYMPHS ABS: 1.6 10*3/uL (ref 1.0–3.6)
Lymphocyte %: 27.6 %
MCH: 27.4 pg (ref 26.0–34.0)
MCHC: 32.1 g/dL (ref 32.0–36.0)
MCV: 85 fL (ref 80–100)
MONOS PCT: 9.4 %
Monocyte #: 0.5 x10 3/mm (ref 0.2–0.9)
Neutrophil #: 3.4 10*3/uL (ref 1.4–6.5)
Neutrophil %: 59 %
Platelet: 183 10*3/uL (ref 150–440)
RBC: 3.74 10*6/uL — ABNORMAL LOW (ref 3.80–5.20)
RDW: 16 % — AB (ref 11.5–14.5)
WBC: 5.8 10*3/uL (ref 3.6–11.0)

## 2015-01-30 LAB — BASIC METABOLIC PANEL
Anion Gap: 7 (ref 7–16)
BUN: 38 mg/dL — ABNORMAL HIGH
CALCIUM: 8.9 mg/dL
CO2: 25 mmol/L
CREATININE: 1.76 mg/dL — AB
Chloride: 100 mmol/L — ABNORMAL LOW
GFR CALC AF AMER: 39 — AB
GFR CALC NON AF AMER: 33 — AB
Glucose: 171 mg/dL — ABNORMAL HIGH
POTASSIUM: 4.3 mmol/L
SODIUM: 132 mmol/L — AB

## 2015-01-30 LAB — BILIRUBIN, DIRECT: BILIRUBIN DIRECT: 0.2 mg/dL

## 2015-01-30 LAB — PHOSPHORUS: Phosphorus: 3.5 mg/dL

## 2015-01-30 LAB — GAMMA GT: GGT: 26 U/L

## 2015-02-19 ENCOUNTER — Ambulatory Visit
Admit: 2015-02-19 | Disposition: A | Discharge status: Home / Self Care | Payer: Self-pay | Attending: Otolaryngology | Admitting: Otolaryngology

## 2015-03-17 ENCOUNTER — Ambulatory Visit: Payer: BLUE CROSS/BLUE SHIELD | Attending: Neurology | Admitting: Physical Therapy

## 2015-03-17 ENCOUNTER — Encounter: Payer: Self-pay | Admitting: Physical Therapy

## 2015-03-17 DIAGNOSIS — R269 Unspecified abnormalities of gait and mobility: Secondary | ICD-10-CM | POA: Diagnosis not present

## 2015-03-17 NOTE — Patient Instructions (Addendum)
SIT TO STAND: No Device   Sit with feet shoulder-width apart, on floor.(Make sure that you are in a chair that won't move like a chair against a wall or couch etc) Lean chest forward, raise hips up from surface. Straighten hips and knees. Weight bear equally on left and right sides. 10___ reps per set, _2__ sets per day, _5__ days per week Place left leg closer to sitting surface.  Copyright  VHI. All rights reserved.  Backward Walking   Walk backward, toes of each foot coming down first. Take long, even strides. Make sure you have a clear pathway with no obstructions when you do this. Stand beside counter and walk backward  And then walk forward doing opposite directions; repeat 10 laps 2x a day at least 5 days a week.  Copyright  VHI. All rights reserved.  Tandem Walking   Stand beside kitchen sink and place one foot in front of the other, lift your hand and try to hold position for 10 sec. Repeat with other foot in front; Repeat 5 reps with each foot in front 5 days a week.Balance: Unilateral   Attempt to balance on left leg, eyes open. Hold _5-10___ seconds.Start with holding onto counter and if you get your balance you can try to let go of counter. Repeat __5__ times per set. Do __1__ sets per session. Do __1__ sessions per day. Keep eyes open:   http://orth.exer.us/29   Copyright  VHI. All rights reserved.

## 2015-03-18 NOTE — Therapy (Signed)
[place] MAIN [facility] SERVICES [address] [place], [place], [address] Phone: [phone]   Fax:  [phone]  Physical Therapy Evaluation  Patient Details  Name: Brandi Carroll MRN: [id number] Date of Birth: 1966/09/22 Referring Provider:  Vladimir Crofts, MD  Encounter Date: 03/17/2015      PT End of Session - 03/17/15 1701    Visit Number 1   Number of Visits 5   Date for PT Re-Evaluation 04/14/15   Authorization Type no Gcodes   PT Start Time 1432   PT Stop Time 1528   PT Time Calculation (min) 56 min   Equipment Utilized During Treatment Gait belt   Activity Tolerance Patient tolerated treatment well   Behavior During Therapy [facility] for tasks assessed/performed      Past Medical History  Diagnosis Date  . Hypertension     reports it goes either too high or too low; taking lisinopril but hasn't been regulated.  . Diabetes mellitus without complication     123456, 7.0 last check; type 1, has insulin pump; subsequently has neuropathy;   . Stroke     Past Surgical History  Procedure Laterality Date  . Kidney transplant  2007  . Eye surgery      bilateral (cataracts)  . Foot surgery      There were no vitals filed for this visit.  Visit Diagnosis:  Abnormality of gait - Plan: PT plan of care cert/re-cert      Subjective Assessment - 03/17/15 1437    Subjective 49 yo Female reports that back in Feb 2016 she had a UTI (went to MD, received levaquin); Patient took a dose Mon/Tues and on Tuesday at work she got a little dizzy. Patient reports that she started walking a little crooked; by Wednesday morning she wasn't able to turn head or eyes without nausea/vomitting; Patient reports that she was very unsteady; She was sent home from [facility] on Friday after being admitted on Wed; Patient reports that she still wasn't able to walk; She called Kidney coordinator; She thought she had vertigo and she tried the BPPV positioning which did not  help; by Feb 29th patient was able to go back to work using rollator; She needed it to help with stability of walking; She used rollator for a few weeks;    Pertinent History Pt is s/p kidney transplant Dec 2007; Patient did call transplant doctor's at [facility] and went to [facility] (she started getting meds for nausea);Went to ENT in March, testing was negative for BPPV;    Limitations Standing;Walking   How long can you walk comfortably? Pt reports that she feels more unsteady rather than dizzy as she feels like she is going to fall.    Diagnostic tests DId get MRI in March which showed Lacunar infarct which resulted in impaired proprioception and instability;    Patient Stated Goals "Not wobble when standing and walking"; Patient wishes she could minimize unsteady feeling;             [facility] PT Assessment - 03/18/15 0800    Assessment   Medical Diagnosis CVA/impaired balance   Onset Date 12/07/14   Next MD Visit June 2016   Prior Therapy none   Precautions   Precautions Fall   Restrictions   Weight Bearing Restrictions No   Balance Screen   Has the patient fallen in the past 6 months No   Has the patient had a decrease in activity level because of a  fear of falling?  Yes   Is the patient reluctant to leave their home because of a fear of falling?  No   Home Environment   Additional Comments Lives in single story home with 3 steps to enter and 1 rail; no steps inside; lives with husband;    Prior Function   Level of Independence Independent with gait;Independent with transfers   Vocation Full time employment  Customer service; sits in cubicle all day;    Vocation Requirements no limitations at work right now; reports that she isn't going out in [employer] as much;    Cognition   Overall Cognitive Status Within Functional Limits for tasks assessed   Sensation   Additional Comments has neuropathy in feet; impaired sharp sensation in feet; intact light touch sensation bilaterally;     Sit to Stand   Comments 9 sec, no HHA, low fall risk;    AROM   Overall AROM Comments BUE and BLE AROM is [facility]   Strength   Overall Strength Comments BUE gross strength is WNL; BLE gross strength is WNL with exception of ankle PF 3-/5   Ambulation/Gait   Gait velocity 10 m/s: slow: (1.12 m/s)9 sec, fast (1.5 m/s) 6.5 sec (community ambulator speed)   Gait Comments ambulates on even surface, supervision with good reciprocal gait pattern, narrow base of support, good foot clearance, occasional sway side/side   6 Minute Walk- Baseline   6 Minute Walk- Baseline yes  1315 feet   BP (mmHg) 128/68 mmHg  post: 136/67   HR (bpm) 79  post 88   Balance   Balance Assessed Yes   Static Standing Balance   Static Standing - Comment/# of Minutes Able to keep balance with feet together eyes open with mod pertubations; mod sway with eyes closed, impaired proprioception   Berg Balance Test   Sit to Stand Able to stand without using hands and stabilize independently   Standing Unsupported Able to stand safely 2 minutes   Sitting with Back Unsupported but Feet Supported on Floor or Stool Able to sit safely and securely 2 minutes   Stand to Sit Sits safely with minimal use of hands   Transfers Able to transfer safely, minor use of hands   Standing Unsupported with Eyes Closed Able to stand 10 seconds with supervision   Standing Ubsupported with Feet Together Able to place feet together independently and stand 1 minute safely   From Standing, Reach Forward with Outstretched Arm Can reach confidently >25 cm (10")   From Standing Position, Pick up Object from Floor Able to pick up shoe safely and easily   From Standing Position, Turn to Look Behind Over each Shoulder Looks behind from both sides and weight shifts well   Turn 360 Degrees Able to turn 360 degrees safely in 4 seconds or less   Standing Unsupported, Alternately Place Feet on Step/Stool Able to stand independently and safely and complete 8 steps in  20 seconds   Standing Unsupported, One Foot in Front Able to take small step independently and hold 30 seconds   Standing on One Leg Tries to lift leg/unable to hold 3 seconds but remains standing independently   Total Score 50                           PT Education - 03/17/15 1700    Education provided Yes   Education Details Please see patient instructions for HEP   Person(s)  Educated Patient   Methods Explanation;Demonstration;Handout   Comprehension Verbalized understanding;Verbal cues required             PT Long Term Goals - 03/17/15 1708    PT LONG TERM GOAL #1   Title Patient will be independent in home exercise program to improve strength/mobility for better functional independence with ADLs, target 04/14/15   Status New   PT LONG TERM GOAL #2   Title Patient will increase Berg Balance score by > 3 points to demonstrate decreased fall risk during functional activities by 04/14/15   Status New   PT LONG TERM GOAL #3   Title Patient will increase six minute walk test distance to >1500 feet for progression towards age norms to improve gait ability by 04/14/15   Status New   PT LONG TERM GOAL #4   Title Patient will tolerate 5 seconds of single leg stance without loss of balance to improve ability to get in and out of shower safely. by 04/14/15   Status New               Plan - 03/17/15 1702    Clinical Impression Statement 49 yo Female reports getting UTI in Feb 2016 and had a bad reaction to levaquin which led to hospitalization. Patient reports being nauseated and dizzy with difficulty walking and imbalance. She had MRI in March 2016 which showed a lacunar infarct in right pons. Patient tested as a moderate fall risk per Merrilee Jansky with increased difficulty with tandem stance and SLS. She also has neuropathy which limits standing balance with decreased visual field such as with eyes closed. She would benefit from additional skilled PT intervention to improve  balance/gait safety and reduce fall risk.    Pt will benefit from skilled therapeutic intervention in order to improve on the following deficits Abnormal gait;Difficulty walking;Decreased safety awareness;Decreased endurance;Decreased activity tolerance;Decreased balance;Decreased mobility   Rehab Potential Good   Clinical Impairments Affecting Rehab Potential positive: family support, motivation; negative: co-morbidities   PT Frequency 1x / week   PT Duration 4 weeks   PT Treatment/Interventions Gait training;Neuromuscular re-education;Visual/perceptual remediation/compensation;Stair training;Patient/family education;Functional mobility training;Therapeutic activities;Therapeutic exercise;Balance training;Moist Heat;Cryotherapy   PT Next Visit Plan advance balance exercise   PT Home Exercise Plan see patient instructions   Consulted and Agree with Plan of Care Patient;Family member/caregiver         Problem List There are no active problems to display for this patient.   Alessandra Grout, PT, DPT, [phone] 03/18/2015 9:10 AM   [place] MAIN [facility] SERVICES [address] [place], [place], [address] Phone: [phone]   Fax:  [phone]

## 2015-03-24 ENCOUNTER — Encounter: Payer: Self-pay | Admitting: Physical Therapy

## 2015-03-24 ENCOUNTER — Ambulatory Visit: Payer: BLUE CROSS/BLUE SHIELD | Admitting: Physical Therapy

## 2015-03-24 DIAGNOSIS — R269 Unspecified abnormalities of gait and mobility: Secondary | ICD-10-CM | POA: Diagnosis not present

## 2015-03-24 NOTE — Therapy (Signed)
[place] MAIN [facility] SERVICES [address] [place], [place], [address] Phone: [phone]   Fax:  [phone]  Physical Therapy Treatment  Patient Details  Name: Brandi Carroll MRN: [id number] Date of Birth: 1966-05-05 Referring Provider:  Vladimir Crofts, MD  Encounter Date: 03/24/2015      PT End of Session - 03/25/15 0849    Visit Number 2   Number of Visits 5   Date for PT Re-Evaluation 04/14/15   Authorization Type no Gcodes   PT Start Time 1645   PT Stop Time 1728   PT Time Calculation (min) 43 min   Equipment Utilized During Treatment Gait belt   Activity Tolerance Patient tolerated treatment well   Behavior During Therapy [facility] for tasks assessed/performed      Past Medical History  Diagnosis Date  . Hypertension     reports it goes either too high or too low; taking lisinopril but hasn't been regulated.  . Diabetes mellitus without complication     123456, 7.0 last check; type 1, has insulin pump; subsequently has neuropathy;   . Stroke     Past Surgical History  Procedure Laterality Date  . Kidney transplant  2007  . Eye surgery      bilateral (cataracts)  . Foot surgery      There were no vitals filed for this visit.  Visit Diagnosis:  Abnormality of gait      Subjective Assessment - 03/24/15 1646    Subjective Patient reports doing pretty well. She reports doing okay with sit<>stand exercise and with backwards walking; patient reports still having some difficulty with tandem stance and SLS; She denies any new falls;    Patient is accompained by: Family member   Patient Stated Goals "Not wobble when standing and walking"; Patient wishes she could minimize unsteady feeling;    Currently in Pain? No/denies          TREATMENT: Warm up on Treadmill 1.5 mph x5 min with HEP instruction:  Advanced HEP with instruction in balance exercise- see patient instructions: -forward lunges without HHA x10  bilaterally; -side lunges with BUE ball chest press x10 bilaterally; -foursquare stepping clockwise/counterclockwise x4 laps each direction, multi-direction x3-4 min; -Marching with ball up/down x2 min with cues; -marching with ball pass left/right x2 min with cues;  Standing in corner with chair in front: -feet together eyes closed 10 sec hold x3 reps; -feet together eyes closed with head turns up/down, side/side x5 reps each; -feet together eyes closed with BUE overhead reach x5 reps;  SLS on firm surface 2-3 sec hold x3 reps each LE; Tandem stance on firm surface 5-10 sec hold x2 reps each LE in front;  Patient required min-moderate verbal/tactile cues for correct exercise technique. Patient required min VCs for balance stability, including to increase trunk control for less loss of balance with smaller base of support                        PT Education - 03/25/15 0848    Education provided Yes   Education Details Advanced HEP- see patient instructions   Person(s) Educated Patient   Methods Explanation;Demonstration;Verbal cues;Tactile cues   Comprehension Verbalized understanding;Returned demonstration;Verbal cues required;Tactile cues required             PT Long Term Goals - 03/17/15 1708    PT LONG TERM GOAL #1   Title Patient will be independent in home exercise program to  improve strength/mobility for better functional independence with ADLs, target 04/14/15   Status New   PT LONG TERM GOAL #2   Title Patient will increase Berg Balance score by > 3 points to demonstrate decreased fall risk during functional activities by 04/14/15   Status New   PT LONG TERM GOAL #3   Title Patient will increase six minute walk test distance to >1500 feet for progression towards age norms to improve gait ability by 04/14/15   Status New   PT LONG TERM GOAL #4   Title Patient will tolerate 5 seconds of single leg stance without loss of balance to improve ability to get  in and out of shower safely. by 04/14/15   Status New               Plan - 03/25/15 0850    Clinical Impression Statement Instructed patient in advanced balance exercise to improve standing safety with static and dynamic tasks. patient responded well to cues. She continues to have difficulty with SLS and with tandem stance tasks but was able to demonstrate better weight shift with cues. She would benefit from additional skilled PT intervention to improve gait safety.   Pt will benefit from skilled therapeutic intervention in order to improve on the following deficits Abnormal gait;Difficulty walking;Decreased safety awareness;Decreased endurance;Decreased activity tolerance;Decreased balance;Decreased mobility   Rehab Potential Good   Clinical Impairments Affecting Rehab Potential positive: family support, motivation; negative: co-morbidities   PT Frequency 1x / week   PT Duration 4 weeks   PT Treatment/Interventions Gait training;Neuromuscular re-education;Visual/perceptual remediation/compensation;Stair training;Patient/family education;Functional mobility training;Therapeutic activities;Therapeutic exercise;Balance training;Moist Heat;Cryotherapy   PT Next Visit Plan advance balance exercise and also initiate LE strengthening with tband   PT Home Exercise Plan see patient instructions   Consulted and Agree with Plan of Care Patient;Family member/caregiver        Problem List There are no active problems to display for this patient.   Alessandra Grout, PT, DPT, [phone] 03/25/2015 9:00 AM   [place] MAIN [facility] SERVICES [address] [place], [place], [address] Phone: [phone]   Fax:  [phone]

## 2015-03-24 NOTE — Patient Instructions (Signed)
Copyright  VHI. All rights reserved.  Forward Lunge   Standing with feet shoulder width apart, step forward into lunge with left leg. Hold ____ seconds. Repeat _10___ times per session. Do __1__ sessions per day. Repeat with opposite leg.  Copyright  VHI. All rights reserved.  Four Square Stepping   Stand in right front square of four square pattern. Move clockwise, stepping from one square to next. Keep facing forward. Repeat _5-6___ times per session. Do _2-3___ sessions per day.  Copyright  VHI. All rights reserved.  Lower Extremity: Lunge / Reach (Lateral)   Hold _0__ pound ball in front of chest. Lunging to side, push ball forward over knee. Repeat _10__ times. Repeat with other leg for set. Do 1___ sets per session.  http://plyo.exer.us/14   Copyright  VHI. All rights reserved.  Marching in Place: [place] in place, throw ball down to floor. Track ball movement with head and eyes. Repeat __10__ times per session. Do _1-2___ sessions per day.   Copyright  VHI. All rights reserved.  Marching in Place: Ball Throw Hand to [employer] in place, throw ball from hand to hand. Track ball movement with head and eyes. Repeat __10__ times per session. Do _1-2___ sessions per day.   Copyright  VHI. All rights reserved.    Feet Apart, Varied Arm Positions - Eyes Closed   Stand in corner (try to not lean on wall) with chair out in front of you for safety. Stand with feet shoulder width apart and arms by side. Close eyes and visualize upright position. Hold __10__ seconds. The open eyes and hold for 10 sec. Repeat alternating between eyes open/closed; As this gets easier, try with feet close together.  Repeat __5__ times per session. Do _2-3___ sessions per day.  Copyright  VHI. All rights reserved.  Feet Apart, Head Motion - Eyes Closed  Stand in corner (try to not lean on wall) with chair out in front of you for safety. With eyes closed and  feet shoulder width apart, move head slowly, up and down 5 times, then open eyes and try to find your balance. Then try with eyes closed and move head side/side x5 times. Repeat _2___ times each direction per session. Do __2__ sessions per day.  Copyright  VHI. All rights reserved.  Feet Apart, Arm Motion - Eyes Closed  Stand in corner (try to not lean on wall) with chair out in front of you for safety. With eyes closed and feet shoulder width apart, move arms up and down: to front. Try to keep feet still and keep a good posture as you move your arms.  Repeat __10__ times per session. Do __2__ sessions per day.  Copyright  VHI. All rights reserved.    To increase difficulty of any exercise, try standing on couch cushion or pillow for uneven surface.  Make sure that you practice good safety with having objects nearby to grab ahold of if you start to loose your balance.  If you start to get dizzy, please stop and take a rest break.

## 2015-03-27 ENCOUNTER — Other Ambulatory Visit
Admission: RE | Admit: 2015-03-27 | Discharge: 2015-03-27 | Disposition: A | Discharge status: Home / Self Care | Payer: BLUE CROSS/BLUE SHIELD | Source: Ambulatory Visit | Attending: Nephrology | Admitting: Nephrology

## 2015-03-27 DIAGNOSIS — Z940 Kidney transplant status: Secondary | ICD-10-CM | POA: Diagnosis present

## 2015-03-27 LAB — CBC WITH DIFFERENTIAL/PLATELET
BASOS ABS: 0 10*3/uL (ref 0–0.1)
Basophils Relative: 1 %
EOS PCT: 3 %
Eosinophils Absolute: 0.1 10*3/uL (ref 0–0.7)
HEMATOCRIT: 36.3 % (ref 35.0–47.0)
Hemoglobin: 11.6 g/dL — ABNORMAL LOW (ref 12.0–16.0)
LYMPHS ABS: 1.4 10*3/uL (ref 1.0–3.6)
Lymphocytes Relative: 28 %
MCH: 27.4 pg (ref 26.0–34.0)
MCHC: 31.9 g/dL — ABNORMAL LOW (ref 32.0–36.0)
MCV: 85.7 fL (ref 80.0–100.0)
MONO ABS: 0.4 10*3/uL (ref 0.2–0.9)
Monocytes Relative: 7 %
NEUTROS ABS: 3.1 10*3/uL (ref 1.4–6.5)
NEUTROS PCT: 61 %
Platelets: 149 10*3/uL — ABNORMAL LOW (ref 150–440)
RBC: 4.24 MIL/uL (ref 3.80–5.20)
RDW: 13.9 % (ref 11.5–14.5)
WBC: 5.1 10*3/uL (ref 3.6–11.0)

## 2015-03-27 LAB — COMPREHENSIVE METABOLIC PANEL
ALK PHOS: 89 U/L (ref 38–126)
ALT: 18 U/L (ref 14–54)
AST: 22 U/L (ref 15–41)
Albumin: 4.4 g/dL (ref 3.5–5.0)
Anion gap: 9 (ref 5–15)
BILIRUBIN TOTAL: 1.3 mg/dL — AB (ref 0.3–1.2)
BUN: 42 mg/dL — AB (ref 6–20)
CHLORIDE: 102 mmol/L (ref 101–111)
CO2: 25 mmol/L (ref 22–32)
Calcium: 9.3 mg/dL (ref 8.9–10.3)
Creatinine, Ser: 1.81 mg/dL — ABNORMAL HIGH (ref 0.44–1.00)
GFR calc Af Amer: 37 mL/min — ABNORMAL LOW (ref >60)
GFR calc non Af Amer: 32 mL/min — ABNORMAL LOW (ref >60)
Glucose, Bld: 205 mg/dL — ABNORMAL HIGH (ref 65–99)
Potassium: 4.4 mmol/L (ref 3.5–5.1)
SODIUM: 136 mmol/L (ref 135–145)
Total Protein: 7.5 g/dL (ref 6.5–8.1)

## 2015-03-27 LAB — LIPID PANEL
CHOL/HDL RATIO: 2.5 ratio
CHOLESTEROL: 151 mg/dL (ref 0–200)
HDL: 60 mg/dL (ref >40)
LDL Cholesterol: 78 mg/dL (ref 0–99)
Triglycerides: 67 mg/dL (ref <150)
VLDL: 13 mg/dL (ref 0–40)

## 2015-03-27 LAB — MAGNESIUM: Magnesium: 1.7 mg/dL (ref 1.7–2.4)

## 2015-03-27 LAB — PHOSPHORUS: Phosphorus: 3.5 mg/dL (ref 2.5–4.6)

## 2015-03-27 LAB — GAMMA GT: GGT: 26 U/L (ref 7–50)

## 2015-03-30 LAB — TACROLIMUS LEVEL: TACROLIMUS (FK506) - LABCORP: 6.3 ng/mL (ref 2.0–20.0)

## 2015-03-31 ENCOUNTER — Ambulatory Visit: Payer: BLUE CROSS/BLUE SHIELD | Admitting: Physical Therapy

## 2015-03-31 ENCOUNTER — Encounter: Payer: Self-pay | Admitting: Physical Therapy

## 2015-03-31 DIAGNOSIS — R269 Unspecified abnormalities of gait and mobility: Secondary | ICD-10-CM

## 2015-03-31 NOTE — Therapy (Signed)
[place] MAIN [facility] SERVICES [address] [place], [place], [address] Phone: [phone]   Fax:  [phone]  Physical Therapy Treatment  Patient Details  Name: Brandi Carroll MRN: [id number] Date of Birth: 1966-06-04 Referring Provider:  Vladimir Crofts, MD  Encounter Date: 03/31/2015      PT End of Session - 03/31/15 1735    Visit Number 3   Number of Visits 5   Date for PT Re-Evaluation 04/14/15   Authorization Type no Gcodes   PT Start Time 1645   PT Stop Time 1727   PT Time Calculation (min) 42 min   Equipment Utilized During Treatment Gait belt   Activity Tolerance Patient tolerated treatment well   Behavior During Therapy [facility] for tasks assessed/performed      Past Medical History  Diagnosis Date  . Hypertension     reports it goes either too high or too low; taking lisinopril but hasn't been regulated.  . Diabetes mellitus without complication     123456, 7.0 last check; type 1, has insulin pump; subsequently has neuropathy;   . Stroke     Past Surgical History  Procedure Laterality Date  . Kidney transplant  2007  . Eye surgery      bilateral (cataracts)  . Foot surgery      There were no vitals filed for this visit.  Visit Diagnosis:  Abnormality of gait      Subjective Assessment - 03/31/15 1649    Subjective Patient reports doing well. she has been busy over the weekend and reports having trouble getting exercises in 2nd time during day because of fatigue/schedule; Denies any new falls; She does report that she still has trouble with quick turns.   Patient is accompained by: Family member   Patient Stated Goals "Not wobble when standing and walking"; Patient wishes she could minimize unsteady feeling;          TREATMENT: Warm up on Treadmill 2.0 mph x3 min with min VCs to increase step length and increase foot clearance;  Resisted weighted gait with cable column, plate #5, red mat, 4 way  (forward/backward, side/side) x3 laps each direction;  Standing in parallel bars with airex beam: -tandem stance without rail assist 10 sec hold x4 each LE; -side stepping without rail assist x2 laps each direction; -forward standing with ball bounce x15;  Forward standing on dyna disc without rail assist, feet slightly apart, eyes open/closed 10 sec hold x3;  SLS with toe taps on cones (#3) x3 reps each LE; Golfer's pick-up of cones x3 pick up and place down, unsupported;   Patient required min-moderate verbal/tactile cues for correct exercise technique. Patient required min VCs for balance stability, including to increase trunk control for less loss of balance with smaller base of support                         PT Education - 03/31/15 1734    Education provided Yes   Education Details educated patient in advanced balance exercise with cues for weight shift especially with LLE SLS   Person(s) Educated Patient   Methods Explanation;Demonstration;Tactile cues;Verbal cues   Comprehension Verbalized understanding;Returned demonstration;Verbal cues required;Tactile cues required             PT Long Term Goals - 03/17/15 1708    PT LONG TERM GOAL #1   Title Patient will be independent in home exercise program to improve strength/mobility for better  functional independence with ADLs, target 04/14/15   Status New   PT LONG TERM GOAL #2   Title Patient will increase Berg Balance score by > 3 points to demonstrate decreased fall risk during functional activities by 04/14/15   Status New   PT LONG TERM GOAL #3   Title Patient will increase six minute walk test distance to >1500 feet for progression towards age norms to improve gait ability by 04/14/15   Status New   PT LONG TERM GOAL #4   Title Patient will tolerate 5 seconds of single leg stance without loss of balance to improve ability to get in and out of shower safely. by 04/14/15   Status New                Plan - 03/31/15 1735    Clinical Impression Statement Instructed patient in advanced balance exercise, utilizing uneven surface for increased challenge. Patient responded fairly to cues; Patient does demonstrate increased LLE hip weakness with difficulty maintaining good positioning with LLE SLS exhibiting slight trendelenburg. Patient would benefit from additional skilled PT intervention to improve balance/gait safety.   Pt will benefit from skilled therapeutic intervention in order to improve on the following deficits Abnormal gait;Difficulty walking;Decreased safety awareness;Decreased endurance;Decreased activity tolerance;Decreased balance;Decreased mobility   Rehab Potential Good   Clinical Impairments Affecting Rehab Potential positive: family support, motivation; negative: co-morbidities   PT Frequency 1x / week   PT Duration 4 weeks   PT Treatment/Interventions Gait training;Neuromuscular re-education;Visual/perceptual remediation/compensation;Stair training;Patient/family education;Functional mobility training;Therapeutic activities;Therapeutic exercise;Balance training;Moist Heat;Cryotherapy   PT Next Visit Plan advance balance exercise and also initiate LE strengthening with tband   PT Home Exercise Plan see patient instructions   Consulted and Agree with Plan of Care Patient;Family member/caregiver        Problem List There are no active problems to display for this patient.   Hopkins,Kathlen Sakurai, PT, DPT 03/31/2015, 5:38 PM  [place] MAIN [facility] SERVICES [address] [place], [place], [address] Phone: [phone]   Fax:  [phone]

## 2015-04-07 ENCOUNTER — Ambulatory Visit: Payer: BLUE CROSS/BLUE SHIELD | Attending: Neurology | Admitting: Physical Therapy

## 2015-04-07 DIAGNOSIS — R269 Unspecified abnormalities of gait and mobility: Secondary | ICD-10-CM | POA: Diagnosis not present

## 2015-04-07 DIAGNOSIS — I69398 Other sequelae of cerebral infarction: Secondary | ICD-10-CM | POA: Diagnosis present

## 2015-04-07 NOTE — Therapy (Signed)
[place] MAIN [facility] SERVICES [address] [place], [place], [address] Phone: [phone]   Fax:  [phone]  Physical Therapy Treatment  Patient Details  Name: Brandi Carroll MRN: [id number] Date of Birth: 1966/09/29 Referring Provider:  Vladimir Crofts, MD  Encounter Date: 04/07/2015      PT End of Session - 04/07/15 1739    Visit Number 4   Number of Visits 5   Date for PT Re-Evaluation 04/14/15   Authorization Type no Gcodes   PT Start Time 1647   PT Stop Time 1729   PT Time Calculation (min) 42 min   Equipment Utilized During Treatment Gait belt   Activity Tolerance Patient tolerated treatment well   Behavior During Therapy [facility] for tasks assessed/performed      Past Medical History  Diagnosis Date  . Hypertension     reports it goes either too high or too low; taking lisinopril but hasn't been regulated.  . Diabetes mellitus without complication     123456, 7.0 last check; type 1, has insulin pump; subsequently has neuropathy;   . Stroke     Past Surgical History  Procedure Laterality Date  . Kidney transplant  2007  . Eye surgery      bilateral (cataracts)  . Foot surgery      There were no vitals filed for this visit.  Visit Diagnosis:  Abnormality of gait      Subjective Assessment - 04/07/15 1733    Subjective Patient reports taking a large step yesterday with forward lunges and hurting her right knee. She denies any pain currently but reports that there was discomfort last night. PT recommended ice; Patient denies any falls; She reports compliance with HEP; She reports walking around [place] with crowds and not having imbalance.   Patient is accompained by: Family member   Patient Stated Goals "Not wobble when standing and walking"; Patient wishes she could minimize unsteady feeling;     TREATMENT: Warm up on Treadmill 2.5 mph with 2 HHA x4 min with HEP instruction;   Advanced HEP: Standing with  green tband around both legs: -hip abduction 2x10 bilaterally; -hip extension 2x10 bilaterally; -hip flexion 2x10 bilaterally; -side stepping 10 feet x2 laps each; -diagonal step outs with band just above knees 10 feet x2 each direction;  Patient required min-moderate verbal/tactile cues for correct exercise technique including cues for stance control, postural control for better strengthening. Instructed patient to slow down steps with side stepping and diagonals for better strengthening.   Balance: SLS on firm surface: 5 sec x1 bilaterally; SLS with toe taps on cones (#3) x4 bilaterally; SLS golfers pick-up x3 each LE;  Patient required min VCs for balance stability, including to increase trunk control for less loss of balance with smaller base of support                             PT Education - 04/07/15 1734    Education provided Yes   Education Details advanced HEP- see patient instructions   Person(s) Educated Patient   Methods Explanation;Demonstration;Tactile cues;Verbal cues   Comprehension Verbalized understanding;Returned demonstration;Verbal cues required;Tactile cues required             PT Long Term Goals - 03/17/15 1708    PT LONG TERM GOAL #1   Title Patient will be independent in home exercise program to improve strength/mobility for better functional independence with ADLs, target  04/14/15   Status New   PT LONG TERM GOAL #2   Title Patient will increase Berg Balance score by > 3 points to demonstrate decreased fall risk during functional activities by 04/14/15   Status New   PT LONG TERM GOAL #3   Title Patient will increase six minute walk test distance to >1500 feet for progression towards age norms to improve gait ability by 04/14/15   Status New   PT LONG TERM GOAL #4   Title Patient will tolerate 5 seconds of single leg stance without loss of balance to improve ability to get in and out of shower safely. by 04/14/15   Status New                Plan - 04/07/15 1739    Clinical Impression Statement Advanced HEP with instruction in standing tband exercise. Patient reports increased fatigue in both legs but especially had difficulty with LLE SLS during RLE hip exercise. She required increased cues for better positioning/posture. Patient would benefit from additional skilled PT intervention to improve balance/gait safety and reduce fall risk.   Pt will benefit from skilled therapeutic intervention in order to improve on the following deficits Abnormal gait;Difficulty walking;Decreased safety awareness;Decreased endurance;Decreased activity tolerance;Decreased balance;Decreased mobility   Rehab Potential Good   Clinical Impairments Affecting Rehab Potential positive: family support, motivation; negative: co-morbidities   PT Frequency 1x / week   PT Duration 4 weeks   PT Treatment/Interventions Gait training;Neuromuscular re-education;Visual/perceptual remediation/compensation;Stair training;Patient/family education;Functional mobility training;Therapeutic activities;Therapeutic exercise;Balance training;Moist Heat;Cryotherapy   PT Next Visit Plan assess goals   PT Home Exercise Plan see patient instructions   Consulted and Agree with Plan of Care Patient;Family member/caregiver        Problem List There are no active problems to display for this patient.   Hopkins,Alaa Mullally, PT, DPT 04/07/2015, 5:46 PM  [place] MAIN [facility] SERVICES [address] [place], [place], [address] Phone: [phone]   Fax:  [phone]

## 2015-04-07 NOTE — Patient Instructions (Addendum)
Balance, Proprioception: Hip Abduction With Tubing   With tubing attached to both ankles, Standing holding onto counter, kick one leg out to side and then Return.  Repeat _10___ times  On each side.  Do ___2_ sessions per day.  http://cc.exer.us/20   Copyright  VHI. All rights reserved.   Balance, Proprioception: Hip Extension With Tubing   With tubing tied around both legs, holding onto kitchen counter, swing leg back. Return. Repeat _10___ times . Do __2__ sessions per day.  http://cc.exer.us/19   Copyright  VHI. All rights reserved.  Balance, Proprioception: Hip Flexion With Tubing   With tubing attached to both ankles, swing leg forward. Return. Repeat _10___ times. Do __2__ sessions per day.  http://cc.exer.us/18   Copyright  VHI. All rights reserved.  Band Walk: Side Stepping   Tie band around legs, just above knees. Step _10__ feet to one side, then step back to start. Repeat _2-3__ feet per session. Note: Small towel between band and skin eases rubbing.  http://plyo.exer.us/76   Copyright  VHI. All rights reserved.  Band Walk: Zig Zag   Tie green band around legs, just above knees. Walk forward _both__ feet in a zig zag pattern. Without turning walk backward to start for one zig zag. Repeat _2-3__ zig zags per session.   http://plyo.exer.us/80   Copyright  VHI. All rights reserved.

## 2015-04-14 ENCOUNTER — Encounter: Payer: Self-pay | Admitting: Physical Therapy

## 2015-04-14 ENCOUNTER — Ambulatory Visit: Payer: BLUE CROSS/BLUE SHIELD | Admitting: Physical Therapy

## 2015-04-14 DIAGNOSIS — I69398 Other sequelae of cerebral infarction: Secondary | ICD-10-CM | POA: Diagnosis not present

## 2015-04-14 DIAGNOSIS — R269 Unspecified abnormalities of gait and mobility: Secondary | ICD-10-CM

## 2015-04-15 NOTE — Therapy (Signed)
[place] MAIN [facility] SERVICES [address] [place], [place], [address] Phone: [phone]   Fax:  [phone]  Physical Therapy Treatment/Discharge Summary  Patient Details  Name: Brandi Carroll MRN: [id number] Date of Birth: 1966-11-05 Referring Provider:  Vladimir Crofts, MD  Encounter Date: 04/14/2015      PT End of Session - 04/15/15 0829    Visit Number 5   Number of Visits 5   Date for PT Re-Evaluation 04/14/15   Authorization Type no Gcodes   PT Start Time 1648   PT Stop Time 1728   PT Time Calculation (min) 40 min   Activity Tolerance Patient tolerated treatment well   Behavior During Therapy [facility] for tasks assessed/performed      Past Medical History  Diagnosis Date  . Hypertension     reports it goes either too high or too low; taking lisinopril but hasn't been regulated.  . Diabetes mellitus without complication     W4O, 7.0 last check; type 1, has insulin pump; subsequently has neuropathy;   . Stroke     Past Surgical History  Procedure Laterality Date  . Kidney transplant  2007  . Eye surgery      bilateral (cataracts)  . Foot surgery      There were no vitals filed for this visit.  Visit Diagnosis:  Abnormality of gait      Subjective Assessment - 04/14/15 1655    Subjective Patient reports having a hard day at work; Patient reports that her walking has been "so-so"; She reports feeling dizzy this morning; She reports doing well with SLS over the weekend, but had difficulty today;    Patient is accompained by: Family member   Patient Stated Goals "Not wobble when standing and walking"; Patient wishes she could minimize unsteady feeling;             [facility] PT Assessment - 04/14/15 1657    6 minute walk test results    Aerobic Endurance Distance Walked 1430   Endurance additional comments improved from initial eval on 03/17/15 which was 44 feet; community ambulator; slightly less than age group norms  of 1800+ feet;   Berg Balance Test   Sit to Stand Able to stand without using hands and stabilize independently   Standing Unsupported Able to stand safely 2 minutes   Sitting with Back Unsupported but Feet Supported on Floor or Stool Able to sit safely and securely 2 minutes   Stand to Sit Sits safely with minimal use of hands   Transfers Able to transfer safely, minor use of hands   Standing Unsupported with Eyes Closed Able to stand 10 seconds safely   Standing Ubsupported with Feet Together Able to place feet together independently and stand 1 minute safely   From Standing, Reach Forward with Outstretched Arm Can reach confidently >25 cm (10")   From Standing Position, Pick up Object from Floor Able to pick up shoe safely and easily   From Standing Position, Turn to Look Behind Over each Shoulder Looks behind from both sides and weight shifts well   Turn 360 Degrees Able to turn 360 degrees safely in 4 seconds or less   Standing Unsupported, Alternately Place Feet on Step/Stool Able to stand independently and safely and complete 8 steps in 20 seconds   Standing Unsupported, One Foot in Front Able to place foot tandem independently and hold 30 seconds   Standing on One Leg Able to lift leg independently  and hold equal to or more than 3 seconds   Total Score 54           Instructed patient in Berg Balance Assessment, 6 min walk, and other outcome measures to assess goals. Patient inconsistent with LLE SLS but was able to hold SLS for 5 sec one time. Patient requires increased cues for better weight shift to improve balance control.  Advanced HEP with Single leg, squat on LLE x5 reps with 1 HHA  Patient has met most goals and is agreeable to DC at this time.                  PT Education - 04/15/15 0828    Education provided Yes   Education Details Advanced HEP with single leg squat; also educated patient in progress towards goals and how to continue with HEP for  progress.   Person(s) Educated Patient   Methods Explanation;Demonstration;Verbal cues   Comprehension Verbalized understanding;Returned demonstration;Verbal cues required             PT Long Term Goals - 04/14/15 1657    PT LONG TERM GOAL #1   Title Patient will be independent in home exercise program to improve strength/mobility for better functional independence with ADLs, target 04/14/15   Status Achieved   PT LONG TERM GOAL #2   Title Patient will increase Berg Balance score by > 3 points to demonstrate decreased fall risk during functional activities by 04/14/15   Status Achieved   PT LONG TERM GOAL #3   Title Patient will increase six minute walk test distance to >1500 feet for progression towards age norms to improve gait ability by 04/14/15   Status Partially Met   PT LONG TERM GOAL #4   Title Patient will tolerate 5 seconds of single leg stance without loss of balance to improve ability to get in and out of shower safely. by 04/14/15   Baseline --   Status Achieved               Plan - 04/15/15 0829    Clinical Impression Statement PT assessed patient's progress towards goals including Berg Balance assessment, six min walk etc to note progress. Patient demonstrates improved balance and gait ability. She still struggles with SLS on left LE however demosntrates better safety awareness. Patient has met most goals. Recommend discharge from PT at this time.   Pt will benefit from skilled therapeutic intervention in order to improve on the following deficits Abnormal gait;Difficulty walking;Decreased safety awareness;Decreased endurance;Decreased activity tolerance;Decreased balance;Decreased mobility   Rehab Potential Good   Clinical Impairments Affecting Rehab Potential positive: family support, motivation; negative: co-morbidities   PT Frequency 1x / week   PT Duration 4 weeks   PT Treatment/Interventions Gait training;Neuromuscular re-education;Visual/perceptual  remediation/compensation;Stair training;Patient/family education;Functional mobility training;Therapeutic activities;Therapeutic exercise;Balance training;Moist Heat;Cryotherapy   PT Next Visit Plan --   PT Home Exercise Plan Advanced HEP with single leg squat   Consulted and Agree with Plan of Care Patient;Family member/caregiver     PHYSICAL THERAPY DISCHARGE SUMMARY  Visits from Start of Care: 5  Current functional level related to goals / functional outcomes: See above   Remaining deficits: Continues to have difficulty with SLS on LLE   Education / Equipment: See above  Plan: Patient agrees to discharge.  Patient goals were met. Patient is being discharged due to meeting the stated rehab goals.  ?????        Problem List There are no active problems to display  for this patient.  Thank you for this referral.  Hopkins,Anntionette Madkins , PT, DPT  04/15/2015, 8:58 AM  [place] MAIN [facility] SERVICES [place], [place], [address] Phone: [phone]   Fax:  [phone]

## 2015-05-07 ENCOUNTER — Other Ambulatory Visit
Admission: RE | Admit: 2015-05-07 | Discharge: 2015-05-07 | Disposition: A | Discharge status: Home / Self Care | Payer: BLUE CROSS/BLUE SHIELD | Source: Ambulatory Visit | Attending: Nephrology | Admitting: Nephrology

## 2015-05-07 DIAGNOSIS — Z79899 Other long term (current) drug therapy: Secondary | ICD-10-CM | POA: Insufficient documentation

## 2015-05-07 DIAGNOSIS — T8610 Unspecified complication of kidney transplant: Secondary | ICD-10-CM | POA: Insufficient documentation

## 2015-05-07 DIAGNOSIS — N390 Urinary tract infection, site not specified: Secondary | ICD-10-CM | POA: Insufficient documentation

## 2015-05-07 DIAGNOSIS — D899 Disorder involving the immune mechanism, unspecified: Secondary | ICD-10-CM | POA: Insufficient documentation

## 2015-05-07 DIAGNOSIS — Z9483 Pancreas transplant status: Secondary | ICD-10-CM | POA: Insufficient documentation

## 2015-05-07 DIAGNOSIS — E559 Vitamin D deficiency, unspecified: Secondary | ICD-10-CM | POA: Diagnosis not present

## 2015-05-07 DIAGNOSIS — B259 Cytomegaloviral disease, unspecified: Secondary | ICD-10-CM | POA: Insufficient documentation

## 2015-05-07 DIAGNOSIS — Z09 Encounter for follow-up examination after completed treatment for conditions other than malignant neoplasm: Secondary | ICD-10-CM | POA: Insufficient documentation

## 2015-05-07 DIAGNOSIS — D631 Anemia in chronic kidney disease: Secondary | ICD-10-CM | POA: Insufficient documentation

## 2015-05-07 DIAGNOSIS — Z940 Kidney transplant status: Secondary | ICD-10-CM | POA: Diagnosis present

## 2015-05-07 DIAGNOSIS — Z789 Other specified health status: Secondary | ICD-10-CM | POA: Diagnosis not present

## 2015-05-07 DIAGNOSIS — Z114 Encounter for screening for human immunodeficiency virus [HIV]: Secondary | ICD-10-CM | POA: Insufficient documentation

## 2015-05-07 LAB — CBC
HCT: 35 % (ref 35.0–47.0)
HEMOGLOBIN: 11 g/dL — AB (ref 12.0–16.0)
MCH: 27 pg (ref 26.0–34.0)
MCHC: 31.5 g/dL — AB (ref 32.0–36.0)
MCV: 85.7 fL (ref 80.0–100.0)
Platelets: 162 10*3/uL (ref 150–440)
RBC: 4.08 MIL/uL (ref 3.80–5.20)
RDW: 13.7 % (ref 11.5–14.5)
WBC: 4.9 10*3/uL (ref 3.6–11.0)

## 2015-05-07 LAB — BASIC METABOLIC PANEL
Anion gap: 10 (ref 5–15)
BUN: 35 mg/dL — ABNORMAL HIGH (ref 6–20)
CO2: 24 mmol/L (ref 22–32)
Calcium: 9.2 mg/dL (ref 8.9–10.3)
Chloride: 104 mmol/L (ref 101–111)
Creatinine, Ser: 1.88 mg/dL — ABNORMAL HIGH (ref 0.44–1.00)
GFR, EST AFRICAN AMERICAN: 35 mL/min — AB (ref >60)
GFR, EST NON AFRICAN AMERICAN: 30 mL/min — AB (ref >60)
Glucose, Bld: 84 mg/dL (ref 65–99)
Potassium: 4.4 mmol/L (ref 3.5–5.1)
Sodium: 138 mmol/L (ref 135–145)

## 2015-05-07 LAB — PHOSPHORUS: Phosphorus: 4.2 mg/dL (ref 2.5–4.6)

## 2015-05-07 LAB — MAGNESIUM: Magnesium: 1.8 mg/dL (ref 1.7–2.4)

## 2015-05-09 LAB — TACROLIMUS LEVEL: Tacrolimus (FK506) - LabCorp: 5.7 ng/mL (ref 2.0–20.0)

## 2015-06-26 ENCOUNTER — Encounter
Admission: RE | Admit: 2015-06-26 | Discharge: 2015-06-26 | Disposition: A | Discharge status: Home / Self Care | Payer: BLUE CROSS/BLUE SHIELD | Source: Ambulatory Visit | Attending: Nephrology | Admitting: Nephrology

## 2015-06-26 DIAGNOSIS — Z79899 Other long term (current) drug therapy: Secondary | ICD-10-CM | POA: Insufficient documentation

## 2015-06-26 DIAGNOSIS — E1129 Type 2 diabetes mellitus with other diabetic kidney complication: Secondary | ICD-10-CM | POA: Diagnosis not present

## 2015-06-26 DIAGNOSIS — N390 Urinary tract infection, site not specified: Secondary | ICD-10-CM | POA: Diagnosis not present

## 2015-06-26 DIAGNOSIS — E559 Vitamin D deficiency, unspecified: Secondary | ICD-10-CM | POA: Insufficient documentation

## 2015-06-26 DIAGNOSIS — Z114 Encounter for screening for human immunodeficiency virus [HIV]: Secondary | ICD-10-CM | POA: Diagnosis not present

## 2015-06-26 DIAGNOSIS — Z940 Kidney transplant status: Secondary | ICD-10-CM | POA: Insufficient documentation

## 2015-06-26 DIAGNOSIS — Z789 Other specified health status: Secondary | ICD-10-CM | POA: Diagnosis not present

## 2015-06-26 DIAGNOSIS — Z09 Encounter for follow-up examination after completed treatment for conditions other than malignant neoplasm: Secondary | ICD-10-CM | POA: Insufficient documentation

## 2015-06-26 DIAGNOSIS — D631 Anemia in chronic kidney disease: Secondary | ICD-10-CM | POA: Diagnosis not present

## 2015-06-26 LAB — COMPREHENSIVE METABOLIC PANEL
ALT: 24 U/L (ref 14–54)
AST: 29 U/L (ref 15–41)
Albumin: 4.8 g/dL (ref 3.5–5.0)
Alkaline Phosphatase: 120 U/L (ref 38–126)
Anion gap: 8 (ref 5–15)
BUN: 29 mg/dL — AB (ref 6–20)
CHLORIDE: 106 mmol/L (ref 101–111)
CO2: 27 mmol/L (ref 22–32)
CREATININE: 1.81 mg/dL — AB (ref 0.44–1.00)
Calcium: 9.6 mg/dL (ref 8.9–10.3)
GFR calc Af Amer: 37 mL/min — ABNORMAL LOW (ref >60)
GFR calc non Af Amer: 32 mL/min — ABNORMAL LOW (ref >60)
GLUCOSE: 110 mg/dL — AB (ref 65–99)
Potassium: 4.4 mmol/L (ref 3.5–5.1)
SODIUM: 141 mmol/L (ref 135–145)
Total Bilirubin: 1.1 mg/dL (ref 0.3–1.2)
Total Protein: 8.1 g/dL (ref 6.5–8.1)

## 2015-06-26 LAB — CBC WITH DIFFERENTIAL/PLATELET
Basophils Absolute: 0 10*3/uL (ref 0–0.1)
Basophils Relative: 1 %
EOS PCT: 4 %
Eosinophils Absolute: 0.2 10*3/uL (ref 0–0.7)
HCT: 38.1 % (ref 35.0–47.0)
HEMOGLOBIN: 12.2 g/dL (ref 12.0–16.0)
LYMPHS ABS: 1.5 10*3/uL (ref 1.0–3.6)
Lymphocytes Relative: 32 %
MCH: 27 pg (ref 26.0–34.0)
MCHC: 31.9 g/dL — AB (ref 32.0–36.0)
MCV: 84.6 fL (ref 80.0–100.0)
MONOS PCT: 8 %
Monocytes Absolute: 0.4 10*3/uL (ref 0.2–0.9)
Neutro Abs: 2.7 10*3/uL (ref 1.4–6.5)
Neutrophils Relative %: 57 %
PLATELETS: 160 10*3/uL (ref 150–440)
RBC: 4.5 MIL/uL (ref 3.80–5.20)
RDW: 14.3 % (ref 11.5–14.5)
WBC: 4.7 10*3/uL (ref 3.6–11.0)

## 2015-06-26 LAB — GAMMA GT: GGT: 27 U/L (ref 7–50)

## 2015-06-26 LAB — LIPID PANEL
CHOL/HDL RATIO: 2.5 ratio
Cholesterol: 154 mg/dL (ref 0–200)
HDL: 62 mg/dL (ref >40)
LDL Cholesterol: 77 mg/dL (ref 0–99)
Triglycerides: 77 mg/dL (ref <150)
VLDL: 15 mg/dL (ref 0–40)

## 2015-06-29 LAB — TACROLIMUS LEVEL: Tacrolimus (FK506) - LabCorp: 6.3 ng/mL (ref 2.0–20.0)

## 2015-07-24 ENCOUNTER — Other Ambulatory Visit
Admission: RE | Admit: 2015-07-24 | Discharge: 2015-07-24 | Disposition: A | Discharge status: Home / Self Care | Payer: BLUE CROSS/BLUE SHIELD | Source: Ambulatory Visit | Attending: Nephrology | Admitting: Nephrology

## 2015-07-24 DIAGNOSIS — Z940 Kidney transplant status: Secondary | ICD-10-CM | POA: Insufficient documentation

## 2015-07-24 LAB — BASIC METABOLIC PANEL
ANION GAP: 8 (ref 5–15)
BUN: 38 mg/dL — ABNORMAL HIGH (ref 6–20)
CALCIUM: 9.5 mg/dL (ref 8.9–10.3)
CO2: 26 mmol/L (ref 22–32)
Chloride: 104 mmol/L (ref 101–111)
Creatinine, Ser: 1.69 mg/dL — ABNORMAL HIGH (ref 0.44–1.00)
GFR calc Af Amer: 40 mL/min — ABNORMAL LOW (ref >60)
GFR, EST NON AFRICAN AMERICAN: 34 mL/min — AB (ref >60)
GLUCOSE: 118 mg/dL — AB (ref 65–99)
Potassium: 4.1 mmol/L (ref 3.5–5.1)
SODIUM: 138 mmol/L (ref 135–145)

## 2015-07-24 LAB — CBC WITH DIFFERENTIAL/PLATELET
Basophils Absolute: 0 10*3/uL (ref 0–0.1)
Basophils Relative: 1 %
EOS PCT: 3 %
Eosinophils Absolute: 0.2 10*3/uL (ref 0–0.7)
HCT: 35.6 % (ref 35.0–47.0)
Hemoglobin: 11.4 g/dL — ABNORMAL LOW (ref 12.0–16.0)
LYMPHS ABS: 1.3 10*3/uL (ref 1.0–3.6)
Lymphocytes Relative: 26 %
MCH: 27.1 pg (ref 26.0–34.0)
MCHC: 32 g/dL (ref 32.0–36.0)
MCV: 84.5 fL (ref 80.0–100.0)
Monocytes Absolute: 0.4 10*3/uL (ref 0.2–0.9)
Monocytes Relative: 8 %
Neutro Abs: 3.2 10*3/uL (ref 1.4–6.5)
Neutrophils Relative %: 62 %
Platelets: 159 10*3/uL (ref 150–440)
RBC: 4.22 MIL/uL (ref 3.80–5.20)
RDW: 13.9 % (ref 11.5–14.5)
WBC: 5.2 10*3/uL (ref 3.6–11.0)

## 2015-07-24 LAB — PHOSPHORUS: Phosphorus: 3.6 mg/dL (ref 2.5–4.6)

## 2015-07-24 LAB — MAGNESIUM: Magnesium: 1.6 mg/dL — ABNORMAL LOW (ref 1.7–2.4)

## 2015-07-27 LAB — TACROLIMUS LEVEL: Tacrolimus (FK506) - LabCorp: 5.6 ng/mL (ref 2.0–20.0)

## 2015-08-17 ENCOUNTER — Other Ambulatory Visit: Payer: Self-pay | Admitting: Obstetrics and Gynecology

## 2015-08-17 DIAGNOSIS — Z1231 Encounter for screening mammogram for malignant neoplasm of breast: Secondary | ICD-10-CM

## 2015-08-28 ENCOUNTER — Other Ambulatory Visit
Admission: RE | Admit: 2015-08-28 | Discharge: 2015-08-28 | Disposition: A | Discharge status: Home / Self Care | Payer: BLUE CROSS/BLUE SHIELD | Source: Ambulatory Visit | Attending: Nephrology | Admitting: Nephrology

## 2015-08-28 DIAGNOSIS — Z09 Encounter for follow-up examination after completed treatment for conditions other than malignant neoplasm: Secondary | ICD-10-CM | POA: Diagnosis present

## 2015-08-28 DIAGNOSIS — E559 Vitamin D deficiency, unspecified: Secondary | ICD-10-CM | POA: Diagnosis not present

## 2015-08-28 DIAGNOSIS — D631 Anemia in chronic kidney disease: Secondary | ICD-10-CM | POA: Diagnosis not present

## 2015-08-28 DIAGNOSIS — Z79899 Other long term (current) drug therapy: Secondary | ICD-10-CM | POA: Diagnosis present

## 2015-08-28 DIAGNOSIS — Z9483 Pancreas transplant status: Secondary | ICD-10-CM | POA: Diagnosis not present

## 2015-08-28 DIAGNOSIS — Z940 Kidney transplant status: Secondary | ICD-10-CM | POA: Insufficient documentation

## 2015-08-28 DIAGNOSIS — Z1159 Encounter for screening for other viral diseases: Secondary | ICD-10-CM | POA: Insufficient documentation

## 2015-08-28 LAB — MAGNESIUM: MAGNESIUM: 1.6 mg/dL — AB (ref 1.7–2.4)

## 2015-08-28 LAB — BASIC METABOLIC PANEL
ANION GAP: 8 (ref 5–15)
BUN: 41 mg/dL — ABNORMAL HIGH (ref 6–20)
CALCIUM: 9.3 mg/dL (ref 8.9–10.3)
CO2: 24 mmol/L (ref 22–32)
Chloride: 103 mmol/L (ref 101–111)
Creatinine, Ser: 1.8 mg/dL — ABNORMAL HIGH (ref 0.44–1.00)
GFR calc non Af Amer: 32 mL/min — ABNORMAL LOW (ref >60)
GFR, EST AFRICAN AMERICAN: 37 mL/min — AB (ref >60)
Glucose, Bld: 72 mg/dL (ref 65–99)
Potassium: 4 mmol/L (ref 3.5–5.1)
Sodium: 135 mmol/L (ref 135–145)

## 2015-08-28 LAB — CBC WITH DIFFERENTIAL/PLATELET
Basophils Absolute: 0 10*3/uL (ref 0–0.1)
Basophils Relative: 1 %
EOS PCT: 3 %
Eosinophils Absolute: 0.2 10*3/uL (ref 0–0.7)
HCT: 33.5 % — ABNORMAL LOW (ref 35.0–47.0)
Hemoglobin: 10.8 g/dL — ABNORMAL LOW (ref 12.0–16.0)
LYMPHS ABS: 1.3 10*3/uL (ref 1.0–3.6)
LYMPHS PCT: 23 %
MCH: 27.2 pg (ref 26.0–34.0)
MCHC: 32.1 g/dL (ref 32.0–36.0)
MCV: 84.7 fL (ref 80.0–100.0)
Monocytes Absolute: 0.4 10*3/uL (ref 0.2–0.9)
Monocytes Relative: 8 %
Neutro Abs: 3.6 10*3/uL (ref 1.4–6.5)
Neutrophils Relative %: 65 %
Platelets: 173 10*3/uL (ref 150–440)
RBC: 3.96 MIL/uL (ref 3.80–5.20)
RDW: 14 % (ref 11.5–14.5)
WBC: 5.6 10*3/uL (ref 3.6–11.0)

## 2015-08-28 LAB — PHOSPHORUS: PHOSPHORUS: 4.3 mg/dL (ref 2.5–4.6)

## 2015-08-30 LAB — TACROLIMUS LEVEL: TACROLIMUS (FK506) - LABCORP: 3.2 ng/mL (ref 2.0–20.0)

## 2015-09-01 ENCOUNTER — Ambulatory Visit: Payer: BLUE CROSS/BLUE SHIELD

## 2015-09-01 ENCOUNTER — Ambulatory Visit
Admission: RE | Admit: 2015-09-01 | Discharge: 2015-09-01 | Disposition: A | Discharge status: Home / Self Care | Payer: BLUE CROSS/BLUE SHIELD | Source: Ambulatory Visit | Attending: Obstetrics and Gynecology | Admitting: Obstetrics and Gynecology

## 2015-09-01 DIAGNOSIS — Z1231 Encounter for screening mammogram for malignant neoplasm of breast: Secondary | ICD-10-CM

## 2015-09-25 ENCOUNTER — Other Ambulatory Visit: Admission: RE | Admit: 2015-09-25 | Payer: BLUE CROSS/BLUE SHIELD | Source: Ambulatory Visit | Admitting: *Deleted

## 2015-10-05 ENCOUNTER — Encounter
Admission: RE | Admit: 2015-10-05 | Discharge: 2015-10-05 | Disposition: A | Discharge status: Home / Self Care | Payer: BLUE CROSS/BLUE SHIELD | Source: Ambulatory Visit | Attending: Nephrology | Admitting: Nephrology

## 2015-10-05 DIAGNOSIS — E559 Vitamin D deficiency, unspecified: Secondary | ICD-10-CM | POA: Insufficient documentation

## 2015-10-05 DIAGNOSIS — Z940 Kidney transplant status: Secondary | ICD-10-CM | POA: Insufficient documentation

## 2015-10-05 DIAGNOSIS — Z79899 Other long term (current) drug therapy: Secondary | ICD-10-CM | POA: Diagnosis present

## 2015-10-05 DIAGNOSIS — D631 Anemia in chronic kidney disease: Secondary | ICD-10-CM | POA: Diagnosis not present

## 2015-10-05 DIAGNOSIS — Z9483 Pancreas transplant status: Secondary | ICD-10-CM | POA: Diagnosis not present

## 2015-10-05 DIAGNOSIS — Z1159 Encounter for screening for other viral diseases: Secondary | ICD-10-CM | POA: Diagnosis not present

## 2015-10-05 DIAGNOSIS — Z09 Encounter for follow-up examination after completed treatment for conditions other than malignant neoplasm: Secondary | ICD-10-CM | POA: Diagnosis not present

## 2015-10-05 LAB — CBC WITH DIFFERENTIAL/PLATELET
BASOS ABS: 0 10*3/uL (ref 0–0.1)
Basophils Relative: 1 %
Eosinophils Absolute: 0.2 10*3/uL (ref 0–0.7)
Eosinophils Relative: 4 %
HCT: 34.7 % — ABNORMAL LOW (ref 35.0–47.0)
HEMOGLOBIN: 10.9 g/dL — AB (ref 12.0–16.0)
LYMPHS ABS: 1.1 10*3/uL (ref 1.0–3.6)
LYMPHS PCT: 23 %
MCH: 26.7 pg (ref 26.0–34.0)
MCHC: 31.4 g/dL — ABNORMAL LOW (ref 32.0–36.0)
MCV: 84.9 fL (ref 80.0–100.0)
Monocytes Absolute: 0.4 10*3/uL (ref 0.2–0.9)
Monocytes Relative: 8 %
NEUTROS ABS: 3.2 10*3/uL (ref 1.4–6.5)
NEUTROS PCT: 64 %
Platelets: 180 10*3/uL (ref 150–440)
RBC: 4.09 MIL/uL (ref 3.80–5.20)
RDW: 14.3 % (ref 11.5–14.5)
WBC: 4.9 10*3/uL (ref 3.6–11.0)

## 2015-10-05 LAB — LIPID PANEL
CHOL/HDL RATIO: 2.3 ratio
Cholesterol: 167 mg/dL (ref 0–200)
HDL: 72 mg/dL (ref >40)
LDL CALC: 80 mg/dL (ref 0–99)
TRIGLYCERIDES: 75 mg/dL (ref <150)
VLDL: 15 mg/dL (ref 0–40)

## 2015-10-05 LAB — COMPREHENSIVE METABOLIC PANEL
ALBUMIN: 4.4 g/dL (ref 3.5–5.0)
ALK PHOS: 104 U/L (ref 38–126)
ALT: 28 U/L (ref 14–54)
ANION GAP: 7 (ref 5–15)
AST: 30 U/L (ref 15–41)
BILIRUBIN TOTAL: 1.2 mg/dL (ref 0.3–1.2)
BUN: 40 mg/dL — ABNORMAL HIGH (ref 6–20)
CALCIUM: 9.2 mg/dL (ref 8.9–10.3)
CO2: 23 mmol/L (ref 22–32)
CREATININE: 1.7 mg/dL — AB (ref 0.44–1.00)
Chloride: 105 mmol/L (ref 101–111)
GFR calc non Af Amer: 34 mL/min — ABNORMAL LOW (ref >60)
GFR, EST AFRICAN AMERICAN: 40 mL/min — AB (ref >60)
GLUCOSE: 157 mg/dL — AB (ref 65–99)
Potassium: 4.8 mmol/L (ref 3.5–5.1)
Sodium: 135 mmol/L (ref 135–145)
TOTAL PROTEIN: 7.8 g/dL (ref 6.5–8.1)

## 2015-10-05 LAB — PHOSPHORUS: PHOSPHORUS: 4.1 mg/dL (ref 2.5–4.6)

## 2015-10-05 LAB — GLUCOSE 6 PHOSPHATE DEHYDROGENASE

## 2015-10-05 LAB — GAMMA GT: GGT: 31 U/L (ref 7–50)

## 2015-10-05 LAB — MAGNESIUM: Magnesium: 1.7 mg/dL (ref 1.7–2.4)

## 2015-10-07 LAB — TACROLIMUS LEVEL: Tacrolimus (FK506) - LabCorp: 3.5 ng/mL (ref 2.0–20.0)

## 2015-11-06 ENCOUNTER — Other Ambulatory Visit
Admission: RE | Admit: 2015-11-06 | Discharge: 2015-11-06 | Disposition: A | Discharge status: Home / Self Care | Payer: BLUE CROSS/BLUE SHIELD | Source: Ambulatory Visit | Attending: Nephrology | Admitting: Nephrology

## 2015-11-06 DIAGNOSIS — Z114 Encounter for screening for human immunodeficiency virus [HIV]: Secondary | ICD-10-CM | POA: Insufficient documentation

## 2015-11-06 DIAGNOSIS — Z79899 Other long term (current) drug therapy: Secondary | ICD-10-CM | POA: Diagnosis present

## 2015-11-06 DIAGNOSIS — Z789 Other specified health status: Secondary | ICD-10-CM | POA: Diagnosis not present

## 2015-11-06 DIAGNOSIS — E559 Vitamin D deficiency, unspecified: Secondary | ICD-10-CM | POA: Diagnosis not present

## 2015-11-06 DIAGNOSIS — E1129 Type 2 diabetes mellitus with other diabetic kidney complication: Secondary | ICD-10-CM | POA: Diagnosis not present

## 2015-11-06 DIAGNOSIS — N390 Urinary tract infection, site not specified: Secondary | ICD-10-CM | POA: Insufficient documentation

## 2015-11-06 DIAGNOSIS — Z940 Kidney transplant status: Secondary | ICD-10-CM | POA: Diagnosis not present

## 2015-11-06 DIAGNOSIS — D631 Anemia in chronic kidney disease: Secondary | ICD-10-CM | POA: Insufficient documentation

## 2015-11-06 LAB — BASIC METABOLIC PANEL
ANION GAP: 5 (ref 5–15)
BUN: 42 mg/dL — ABNORMAL HIGH (ref 6–20)
CALCIUM: 9.4 mg/dL (ref 8.9–10.3)
CO2: 26 mmol/L (ref 22–32)
CREATININE: 1.7 mg/dL — AB (ref 0.44–1.00)
Chloride: 103 mmol/L (ref 101–111)
GFR, EST AFRICAN AMERICAN: 40 mL/min — AB (ref >60)
GFR, EST NON AFRICAN AMERICAN: 34 mL/min — AB (ref >60)
Glucose, Bld: 87 mg/dL (ref 65–99)
Potassium: 4.6 mmol/L (ref 3.5–5.1)
SODIUM: 134 mmol/L — AB (ref 135–145)

## 2015-11-06 LAB — CBC WITH DIFFERENTIAL/PLATELET
BASOS ABS: 0 10*3/uL (ref 0–0.1)
BASOS PCT: 1 %
EOS ABS: 0.2 10*3/uL (ref 0–0.7)
Eosinophils Relative: 5 %
HEMATOCRIT: 34.2 % — AB (ref 35.0–47.0)
HEMOGLOBIN: 11.1 g/dL — AB (ref 12.0–16.0)
Lymphocytes Relative: 31 %
Lymphs Abs: 1.5 10*3/uL (ref 1.0–3.6)
MCH: 26.7 pg (ref 26.0–34.0)
MCHC: 32.6 g/dL (ref 32.0–36.0)
MCV: 81.9 fL (ref 80.0–100.0)
Monocytes Absolute: 0.5 10*3/uL (ref 0.2–0.9)
Monocytes Relative: 10 %
NEUTROS ABS: 2.6 10*3/uL (ref 1.4–6.5)
NEUTROS PCT: 53 %
Platelets: 164 10*3/uL (ref 150–440)
RBC: 4.18 MIL/uL (ref 3.80–5.20)
RDW: 13.8 % (ref 11.5–14.5)
WBC: 4.8 10*3/uL (ref 3.6–11.0)

## 2015-11-06 LAB — PHOSPHORUS: PHOSPHORUS: 4.1 mg/dL (ref 2.5–4.6)

## 2015-11-06 LAB — MAGNESIUM: MAGNESIUM: 1.7 mg/dL (ref 1.7–2.4)

## 2015-11-08 LAB — TACROLIMUS LEVEL: TACROLIMUS (FK506) - LABCORP: 7 ng/mL (ref 2.0–20.0)

## 2015-12-04 ENCOUNTER — Other Ambulatory Visit
Admission: RE | Admit: 2015-12-04 | Discharge: 2015-12-04 | Disposition: A | Discharge status: Home / Self Care | Payer: BLUE CROSS/BLUE SHIELD | Source: Ambulatory Visit | Attending: Nephrology | Admitting: Nephrology

## 2015-12-04 DIAGNOSIS — Z79899 Other long term (current) drug therapy: Secondary | ICD-10-CM | POA: Diagnosis not present

## 2015-12-04 DIAGNOSIS — Z940 Kidney transplant status: Secondary | ICD-10-CM | POA: Diagnosis not present

## 2015-12-04 DIAGNOSIS — Z789 Other specified health status: Secondary | ICD-10-CM | POA: Insufficient documentation

## 2015-12-04 DIAGNOSIS — D631 Anemia in chronic kidney disease: Secondary | ICD-10-CM | POA: Insufficient documentation

## 2015-12-04 DIAGNOSIS — E559 Vitamin D deficiency, unspecified: Secondary | ICD-10-CM | POA: Diagnosis not present

## 2015-12-04 DIAGNOSIS — E1129 Type 2 diabetes mellitus with other diabetic kidney complication: Secondary | ICD-10-CM | POA: Diagnosis not present

## 2015-12-04 DIAGNOSIS — Z09 Encounter for follow-up examination after completed treatment for conditions other than malignant neoplasm: Secondary | ICD-10-CM | POA: Insufficient documentation

## 2015-12-04 DIAGNOSIS — Z114 Encounter for screening for human immunodeficiency virus [HIV]: Secondary | ICD-10-CM | POA: Diagnosis not present

## 2015-12-04 DIAGNOSIS — N390 Urinary tract infection, site not specified: Secondary | ICD-10-CM | POA: Diagnosis not present

## 2015-12-04 LAB — CBC WITH DIFFERENTIAL/PLATELET
Basophils Absolute: 0 10*3/uL (ref 0–0.1)
Basophils Relative: 1 %
EOS PCT: 4 %
Eosinophils Absolute: 0.2 10*3/uL (ref 0–0.7)
HCT: 34.9 % — ABNORMAL LOW (ref 35.0–47.0)
Hemoglobin: 11.3 g/dL — ABNORMAL LOW (ref 12.0–16.0)
LYMPHS PCT: 33 %
Lymphs Abs: 1.3 10*3/uL (ref 1.0–3.6)
MCH: 26.7 pg (ref 26.0–34.0)
MCHC: 32.3 g/dL (ref 32.0–36.0)
MCV: 82.4 fL (ref 80.0–100.0)
MONO ABS: 0.4 10*3/uL (ref 0.2–0.9)
MONOS PCT: 9 %
Neutro Abs: 2.2 10*3/uL (ref 1.4–6.5)
Neutrophils Relative %: 53 %
PLATELETS: 174 10*3/uL (ref 150–440)
RBC: 4.23 MIL/uL (ref 3.80–5.20)
RDW: 14.1 % (ref 11.5–14.5)
WBC: 4.1 10*3/uL (ref 3.6–11.0)

## 2015-12-04 LAB — BASIC METABOLIC PANEL
Anion gap: 10 (ref 5–15)
BUN: 43 mg/dL — AB (ref 6–20)
CALCIUM: 9.5 mg/dL (ref 8.9–10.3)
CO2: 22 mmol/L (ref 22–32)
CREATININE: 1.82 mg/dL — AB (ref 0.44–1.00)
Chloride: 102 mmol/L (ref 101–111)
GFR calc non Af Amer: 31 mL/min — ABNORMAL LOW (ref >60)
GFR, EST AFRICAN AMERICAN: 36 mL/min — AB (ref >60)
Glucose, Bld: 122 mg/dL — ABNORMAL HIGH (ref 65–99)
Potassium: 4.6 mmol/L (ref 3.5–5.1)
SODIUM: 134 mmol/L — AB (ref 135–145)

## 2015-12-04 LAB — PHOSPHORUS: PHOSPHORUS: 4.1 mg/dL (ref 2.5–4.6)

## 2015-12-04 LAB — MAGNESIUM: MAGNESIUM: 1.7 mg/dL (ref 1.7–2.4)

## 2015-12-06 LAB — TACROLIMUS LEVEL: Tacrolimus (FK506) - LabCorp: 6.3 ng/mL (ref 2.0–20.0)

## 2015-12-30 ENCOUNTER — Other Ambulatory Visit: Payer: Self-pay | Admitting: Ophthalmology

## 2015-12-30 ENCOUNTER — Ambulatory Visit
Admission: RE | Admit: 2015-12-30 | Discharge: 2015-12-30 | Disposition: A | Discharge status: Home / Self Care | Payer: BLUE CROSS/BLUE SHIELD | Source: Ambulatory Visit | Attending: Ophthalmology | Admitting: Ophthalmology

## 2015-12-30 DIAGNOSIS — I672 Cerebral atherosclerosis: Secondary | ICD-10-CM | POA: Insufficient documentation

## 2015-12-30 DIAGNOSIS — H4902 Third [oculomotor] nerve palsy, left eye: Secondary | ICD-10-CM | POA: Diagnosis not present

## 2015-12-30 DIAGNOSIS — I639 Cerebral infarction, unspecified: Secondary | ICD-10-CM | POA: Insufficient documentation

## 2015-12-30 DIAGNOSIS — I6782 Cerebral ischemia: Secondary | ICD-10-CM | POA: Insufficient documentation

## 2015-12-30 MED ORDER — IOHEXOL 350 MG/ML SOLN: 80.0000 mL | Freq: Once | INTRAVENOUS | Status: DC | PRN

## 2015-12-30 MED ORDER — IOHEXOL 350 MG/ML SOLN
65.0000 mL | Freq: Once | INTRAVENOUS | Status: AC | PRN
  Administered 2015-12-30: 65 mL via INTRAVENOUS

## 2016-01-03 ENCOUNTER — Other Ambulatory Visit: Payer: Self-pay | Admitting: Ophthalmology

## 2016-01-03 DIAGNOSIS — G510 Bell's palsy: Secondary | ICD-10-CM

## 2016-01-04 ENCOUNTER — Ambulatory Visit
Admission: RE | Admit: 2016-01-04 | Discharge: 2016-01-04 | Disposition: A | Discharge status: Home / Self Care | Payer: BLUE CROSS/BLUE SHIELD | Source: Ambulatory Visit | Attending: Ophthalmology | Admitting: Ophthalmology

## 2016-01-04 DIAGNOSIS — Z8673 Personal history of transient ischemic attack (TIA), and cerebral infarction without residual deficits: Secondary | ICD-10-CM | POA: Insufficient documentation

## 2016-01-04 DIAGNOSIS — G519 Disorder of facial nerve, unspecified: Secondary | ICD-10-CM | POA: Diagnosis not present

## 2016-01-04 DIAGNOSIS — G319 Degenerative disease of nervous system, unspecified: Secondary | ICD-10-CM | POA: Diagnosis not present

## 2016-01-04 DIAGNOSIS — G510 Bell's palsy: Secondary | ICD-10-CM

## 2016-01-04 DIAGNOSIS — R9082 White matter disease, unspecified: Secondary | ICD-10-CM | POA: Insufficient documentation

## 2016-01-04 MED ORDER — GADOBENATE DIMEGLUMINE 529 MG/ML IV SOLN
10.0000 mL | Freq: Once | INTRAVENOUS | Status: AC | PRN
  Administered 2016-01-04: 7 mL via INTRAVENOUS

## 2016-01-21 ENCOUNTER — Ambulatory Visit: Payer: BLUE CROSS/BLUE SHIELD

## 2016-01-29 ENCOUNTER — Encounter
Admission: RE | Admit: 2016-01-29 | Discharge: 2016-01-29 | Disposition: A | Discharge status: Home / Self Care | Payer: 59 | Source: Ambulatory Visit | Attending: Nephrology | Admitting: Nephrology

## 2016-01-29 DIAGNOSIS — Z789 Other specified health status: Secondary | ICD-10-CM | POA: Insufficient documentation

## 2016-01-29 DIAGNOSIS — Z114 Encounter for screening for human immunodeficiency virus [HIV]: Secondary | ICD-10-CM | POA: Insufficient documentation

## 2016-01-29 DIAGNOSIS — B259 Cytomegaloviral disease, unspecified: Secondary | ICD-10-CM | POA: Diagnosis not present

## 2016-01-29 DIAGNOSIS — E559 Vitamin D deficiency, unspecified: Secondary | ICD-10-CM | POA: Diagnosis not present

## 2016-01-29 DIAGNOSIS — Z9483 Pancreas transplant status: Secondary | ICD-10-CM | POA: Insufficient documentation

## 2016-01-29 DIAGNOSIS — E1129 Type 2 diabetes mellitus with other diabetic kidney complication: Secondary | ICD-10-CM | POA: Diagnosis not present

## 2016-01-29 DIAGNOSIS — Z79899 Other long term (current) drug therapy: Secondary | ICD-10-CM | POA: Diagnosis not present

## 2016-01-29 DIAGNOSIS — Z09 Encounter for follow-up examination after completed treatment for conditions other than malignant neoplasm: Secondary | ICD-10-CM | POA: Diagnosis not present

## 2016-01-29 DIAGNOSIS — D631 Anemia in chronic kidney disease: Secondary | ICD-10-CM | POA: Diagnosis not present

## 2016-01-29 DIAGNOSIS — N390 Urinary tract infection, site not specified: Secondary | ICD-10-CM | POA: Diagnosis not present

## 2016-01-29 DIAGNOSIS — Z940 Kidney transplant status: Secondary | ICD-10-CM | POA: Insufficient documentation

## 2016-01-29 DIAGNOSIS — D899 Disorder involving the immune mechanism, unspecified: Secondary | ICD-10-CM | POA: Diagnosis present

## 2016-01-29 LAB — CBC WITH DIFFERENTIAL/PLATELET
BASOS ABS: 0 10*3/uL (ref 0–0.1)
Basophils Relative: 0 %
EOS PCT: 2 %
Eosinophils Absolute: 0.1 10*3/uL (ref 0–0.7)
HEMATOCRIT: 31.3 % — AB (ref 35.0–47.0)
Hemoglobin: 10.4 g/dL — ABNORMAL LOW (ref 12.0–16.0)
LYMPHS ABS: 1.2 10*3/uL (ref 1.0–3.6)
LYMPHS PCT: 21 %
MCH: 27.4 pg (ref 26.0–34.0)
MCHC: 33 g/dL (ref 32.0–36.0)
MCV: 83 fL (ref 80.0–100.0)
MONO ABS: 0.4 10*3/uL (ref 0.2–0.9)
MONOS PCT: 6 %
NEUTROS ABS: 4.2 10*3/uL (ref 1.4–6.5)
Neutrophils Relative %: 71 %
PLATELETS: 204 10*3/uL (ref 150–440)
RBC: 3.78 MIL/uL — ABNORMAL LOW (ref 3.80–5.20)
RDW: 15.1 % — AB (ref 11.5–14.5)
WBC: 5.9 10*3/uL (ref 3.6–11.0)

## 2016-01-29 LAB — CHOLESTEROL, TOTAL: CHOLESTEROL: 147 mg/dL (ref 0–200)

## 2016-01-29 LAB — GAMMA GT: GGT: 27 U/L (ref 7–50)

## 2016-01-29 LAB — BILIRUBIN, TOTAL: Total Bilirubin: 0.8 mg/dL (ref 0.3–1.2)

## 2016-01-29 LAB — BASIC METABOLIC PANEL
Anion gap: 5 (ref 5–15)
BUN: 40 mg/dL — AB (ref 6–20)
CALCIUM: 9.3 mg/dL (ref 8.9–10.3)
CHLORIDE: 104 mmol/L (ref 101–111)
CO2: 27 mmol/L (ref 22–32)
CREATININE: 1.93 mg/dL — AB (ref 0.44–1.00)
GFR calc non Af Amer: 29 mL/min — ABNORMAL LOW (ref >60)
GFR, EST AFRICAN AMERICAN: 34 mL/min — AB (ref >60)
Glucose, Bld: 151 mg/dL — ABNORMAL HIGH (ref 65–99)
Potassium: 4.2 mmol/L (ref 3.5–5.1)
Sodium: 136 mmol/L (ref 135–145)

## 2016-01-29 LAB — ALBUMIN: Albumin: 4.5 g/dL (ref 3.5–5.0)

## 2016-01-29 LAB — AST: AST: 26 U/L (ref 15–41)

## 2016-01-29 LAB — PHOSPHORUS: Phosphorus: 4.3 mg/dL (ref 2.5–4.6)

## 2016-01-29 LAB — MAGNESIUM: Magnesium: 1.8 mg/dL (ref 1.7–2.4)

## 2016-01-29 LAB — TRIGLYCERIDES: TRIGLYCERIDES: 72 mg/dL (ref <150)

## 2016-01-29 LAB — ALT: ALT: 23 U/L (ref 14–54)

## 2016-01-29 LAB — HDL CHOLESTEROL: HDL: 62 mg/dL (ref >40)

## 2016-01-31 LAB — LDL CHOLESTEROL, DIRECT: LDL DIRECT: 70 mg/dL (ref 0–99)

## 2016-01-31 LAB — TACROLIMUS LEVEL: Tacrolimus (FK506) - LabCorp: 6.7 ng/mL (ref 2.0–20.0)

## 2016-01-31 LAB — ALKALINE PHOSPHATASE: Alkaline Phosphatase: 101 U/L (ref 38–126)

## 2016-03-11 ENCOUNTER — Other Ambulatory Visit
Admission: RE | Admit: 2016-03-11 | Discharge: 2016-03-11 | Disposition: A | Discharge status: Home / Self Care | Payer: 59 | Source: Ambulatory Visit | Attending: Nephrology | Admitting: Nephrology

## 2016-03-11 DIAGNOSIS — Z114 Encounter for screening for human immunodeficiency virus [HIV]: Secondary | ICD-10-CM | POA: Diagnosis not present

## 2016-03-11 DIAGNOSIS — D899 Disorder involving the immune mechanism, unspecified: Secondary | ICD-10-CM | POA: Diagnosis not present

## 2016-03-11 DIAGNOSIS — E1129 Type 2 diabetes mellitus with other diabetic kidney complication: Secondary | ICD-10-CM | POA: Insufficient documentation

## 2016-03-11 DIAGNOSIS — Z79899 Other long term (current) drug therapy: Secondary | ICD-10-CM | POA: Insufficient documentation

## 2016-03-11 DIAGNOSIS — N390 Urinary tract infection, site not specified: Secondary | ICD-10-CM | POA: Insufficient documentation

## 2016-03-11 DIAGNOSIS — Z940 Kidney transplant status: Secondary | ICD-10-CM | POA: Diagnosis not present

## 2016-03-11 DIAGNOSIS — E559 Vitamin D deficiency, unspecified: Secondary | ICD-10-CM | POA: Insufficient documentation

## 2016-03-11 LAB — CBC WITH DIFFERENTIAL/PLATELET
BASOS ABS: 0 10*3/uL (ref 0–0.1)
Basophils Relative: 1 %
EOS ABS: 0.3 10*3/uL (ref 0–0.7)
EOS PCT: 6 %
HCT: 34.7 % — ABNORMAL LOW (ref 35.0–47.0)
HEMOGLOBIN: 11 g/dL — AB (ref 12.0–16.0)
LYMPHS ABS: 1.3 10*3/uL (ref 1.0–3.6)
LYMPHS PCT: 27 %
MCH: 27.4 pg (ref 26.0–34.0)
MCHC: 31.7 g/dL — ABNORMAL LOW (ref 32.0–36.0)
MCV: 86.2 fL (ref 80.0–100.0)
Monocytes Absolute: 0.4 10*3/uL (ref 0.2–0.9)
Monocytes Relative: 7 %
NEUTROS PCT: 59 %
Neutro Abs: 2.8 10*3/uL (ref 1.4–6.5)
PLATELETS: 180 10*3/uL (ref 150–440)
RBC: 4.03 MIL/uL (ref 3.80–5.20)
RDW: 15.8 % — ABNORMAL HIGH (ref 11.5–14.5)
WBC: 4.8 10*3/uL (ref 3.6–11.0)

## 2016-03-11 LAB — BASIC METABOLIC PANEL
Anion gap: 10 (ref 5–15)
BUN: 45 mg/dL — AB (ref 6–20)
CHLORIDE: 103 mmol/L (ref 101–111)
CO2: 24 mmol/L (ref 22–32)
Calcium: 9.5 mg/dL (ref 8.9–10.3)
Creatinine, Ser: 1.64 mg/dL — ABNORMAL HIGH (ref 0.44–1.00)
GFR, EST AFRICAN AMERICAN: 41 mL/min — AB (ref >60)
GFR, EST NON AFRICAN AMERICAN: 36 mL/min — AB (ref >60)
Glucose, Bld: 96 mg/dL (ref 65–99)
POTASSIUM: 4.3 mmol/L (ref 3.5–5.1)
SODIUM: 137 mmol/L (ref 135–145)

## 2016-03-11 LAB — MAGNESIUM: MAGNESIUM: 1.9 mg/dL (ref 1.7–2.4)

## 2016-03-11 LAB — PHOSPHORUS: Phosphorus: 3.8 mg/dL (ref 2.5–4.6)

## 2016-03-14 LAB — TACROLIMUS LEVEL: Tacrolimus (FK506) - LabCorp: 6.6 ng/mL (ref 2.0–20.0)

## 2016-04-08 ENCOUNTER — Other Ambulatory Visit
Admission: RE | Admit: 2016-04-08 | Discharge: 2016-04-08 | Disposition: A | Discharge status: Home / Self Care | Payer: 59 | Source: Ambulatory Visit | Attending: Nephrology | Admitting: Nephrology

## 2016-04-08 DIAGNOSIS — D899 Disorder involving the immune mechanism, unspecified: Secondary | ICD-10-CM | POA: Diagnosis present

## 2016-04-08 DIAGNOSIS — Z940 Kidney transplant status: Secondary | ICD-10-CM | POA: Diagnosis present

## 2016-04-08 LAB — CBC WITH DIFFERENTIAL/PLATELET
Basophils Absolute: 0 10*3/uL (ref 0–0.1)
Basophils Relative: 1 %
EOS PCT: 5 %
Eosinophils Absolute: 0.3 10*3/uL (ref 0–0.7)
HEMATOCRIT: 34.7 % — AB (ref 35.0–47.0)
HEMOGLOBIN: 11.2 g/dL — AB (ref 12.0–16.0)
LYMPHS ABS: 1 10*3/uL (ref 1.0–3.6)
LYMPHS PCT: 22 %
MCH: 27.3 pg (ref 26.0–34.0)
MCHC: 32.1 g/dL (ref 32.0–36.0)
MCV: 85 fL (ref 80.0–100.0)
Monocytes Absolute: 0.4 10*3/uL (ref 0.2–0.9)
Monocytes Relative: 8 %
Neutro Abs: 3 10*3/uL (ref 1.4–6.5)
Neutrophils Relative %: 64 %
PLATELETS: 146 10*3/uL — AB (ref 150–440)
RBC: 4.08 MIL/uL (ref 3.80–5.20)
RDW: 14.6 % — ABNORMAL HIGH (ref 11.5–14.5)
WBC: 4.7 10*3/uL (ref 3.6–11.0)

## 2016-04-08 LAB — BASIC METABOLIC PANEL
ANION GAP: 10 (ref 5–15)
BUN: 42 mg/dL — ABNORMAL HIGH (ref 6–20)
CALCIUM: 9.3 mg/dL (ref 8.9–10.3)
CO2: 21 mmol/L — AB (ref 22–32)
Chloride: 103 mmol/L (ref 101–111)
Creatinine, Ser: 1.63 mg/dL — ABNORMAL HIGH (ref 0.44–1.00)
GFR calc Af Amer: 41 mL/min — ABNORMAL LOW (ref >60)
GFR calc non Af Amer: 36 mL/min — ABNORMAL LOW (ref >60)
GLUCOSE: 100 mg/dL — AB (ref 65–99)
Potassium: 4.5 mmol/L (ref 3.5–5.1)
Sodium: 134 mmol/L — ABNORMAL LOW (ref 135–145)

## 2016-04-08 LAB — MAGNESIUM: Magnesium: 1.7 mg/dL (ref 1.7–2.4)

## 2016-04-08 LAB — PHOSPHORUS: Phosphorus: 4.1 mg/dL (ref 2.5–4.6)

## 2016-04-11 LAB — TACROLIMUS LEVEL: TACROLIMUS (FK506) - LABCORP: 5.8 ng/mL (ref 2.0–20.0)

## 2016-05-20 ENCOUNTER — Encounter
Admission: RE | Admit: 2016-05-20 | Discharge: 2016-05-20 | Disposition: A | Discharge status: Home / Self Care | Payer: 59 | Source: Ambulatory Visit | Attending: Nephrology | Admitting: Nephrology

## 2016-05-20 DIAGNOSIS — E1129 Type 2 diabetes mellitus with other diabetic kidney complication: Secondary | ICD-10-CM | POA: Diagnosis not present

## 2016-05-20 DIAGNOSIS — D899 Disorder involving the immune mechanism, unspecified: Secondary | ICD-10-CM | POA: Diagnosis not present

## 2016-05-20 DIAGNOSIS — Z114 Encounter for screening for human immunodeficiency virus [HIV]: Secondary | ICD-10-CM | POA: Insufficient documentation

## 2016-05-20 DIAGNOSIS — B259 Cytomegaloviral disease, unspecified: Secondary | ICD-10-CM | POA: Diagnosis not present

## 2016-05-20 DIAGNOSIS — D631 Anemia in chronic kidney disease: Secondary | ICD-10-CM | POA: Insufficient documentation

## 2016-05-20 DIAGNOSIS — E559 Vitamin D deficiency, unspecified: Secondary | ICD-10-CM | POA: Diagnosis not present

## 2016-05-20 DIAGNOSIS — Z940 Kidney transplant status: Secondary | ICD-10-CM | POA: Insufficient documentation

## 2016-05-20 DIAGNOSIS — Z79899 Other long term (current) drug therapy: Secondary | ICD-10-CM | POA: Diagnosis present

## 2016-05-20 DIAGNOSIS — Z9483 Pancreas transplant status: Secondary | ICD-10-CM | POA: Diagnosis not present

## 2016-05-20 DIAGNOSIS — N390 Urinary tract infection, site not specified: Secondary | ICD-10-CM | POA: Diagnosis not present

## 2016-05-20 DIAGNOSIS — Z789 Other specified health status: Secondary | ICD-10-CM | POA: Insufficient documentation

## 2016-05-20 LAB — CBC WITH DIFFERENTIAL/PLATELET
BASOS PCT: 1 %
Basophils Absolute: 0 10*3/uL (ref 0–0.1)
EOS PCT: 5 %
Eosinophils Absolute: 0.2 10*3/uL (ref 0–0.7)
HCT: 37.6 % (ref 35.0–47.0)
HEMOGLOBIN: 12.2 g/dL (ref 12.0–16.0)
Lymphocytes Relative: 26 %
Lymphs Abs: 1.2 10*3/uL (ref 1.0–3.6)
MCH: 27.5 pg (ref 26.0–34.0)
MCHC: 32.5 g/dL (ref 32.0–36.0)
MCV: 84.6 fL (ref 80.0–100.0)
MONO ABS: 0.4 10*3/uL (ref 0.2–0.9)
MONOS PCT: 9 %
NEUTROS ABS: 2.8 10*3/uL (ref 1.4–6.5)
Neutrophils Relative %: 59 %
PLATELETS: 156 10*3/uL (ref 150–440)
RBC: 4.44 MIL/uL (ref 3.80–5.20)
RDW: 13.9 % (ref 11.5–14.5)
WBC: 4.6 10*3/uL (ref 3.6–11.0)

## 2016-05-20 LAB — BASIC METABOLIC PANEL
Anion gap: 9 (ref 5–15)
BUN: 41 mg/dL — AB (ref 6–20)
CALCIUM: 9.1 mg/dL (ref 8.9–10.3)
CHLORIDE: 101 mmol/L (ref 101–111)
CO2: 25 mmol/L (ref 22–32)
CREATININE: 1.65 mg/dL — AB (ref 0.44–1.00)
GFR, EST AFRICAN AMERICAN: 41 mL/min — AB (ref >60)
GFR, EST NON AFRICAN AMERICAN: 35 mL/min — AB (ref >60)
Glucose, Bld: 80 mg/dL (ref 65–99)
Potassium: 4.2 mmol/L (ref 3.5–5.1)
SODIUM: 135 mmol/L (ref 135–145)

## 2016-05-20 LAB — MAGNESIUM: MAGNESIUM: 1.8 mg/dL (ref 1.7–2.4)

## 2016-05-20 LAB — PHOSPHORUS: Phosphorus: 4.2 mg/dL (ref 2.5–4.6)

## 2016-05-23 LAB — TACROLIMUS LEVEL: TACROLIMUS (FK506) - LABCORP: NOT DETECTED ng/mL (ref 2.0–20.0)

## 2016-06-17 ENCOUNTER — Encounter
Admission: RE | Admit: 2016-06-17 | Discharge: 2016-06-17 | Disposition: A | Discharge status: Home / Self Care | Payer: 59 | Source: Ambulatory Visit | Attending: Nephrology | Admitting: Nephrology

## 2016-06-17 DIAGNOSIS — D899 Disorder involving the immune mechanism, unspecified: Secondary | ICD-10-CM | POA: Diagnosis present

## 2016-06-17 DIAGNOSIS — Z09 Encounter for follow-up examination after completed treatment for conditions other than malignant neoplasm: Secondary | ICD-10-CM | POA: Insufficient documentation

## 2016-06-17 DIAGNOSIS — E559 Vitamin D deficiency, unspecified: Secondary | ICD-10-CM | POA: Diagnosis not present

## 2016-06-17 DIAGNOSIS — B259 Cytomegaloviral disease, unspecified: Secondary | ICD-10-CM | POA: Insufficient documentation

## 2016-06-17 DIAGNOSIS — Z114 Encounter for screening for human immunodeficiency virus [HIV]: Secondary | ICD-10-CM | POA: Insufficient documentation

## 2016-06-17 DIAGNOSIS — Z940 Kidney transplant status: Secondary | ICD-10-CM | POA: Diagnosis not present

## 2016-06-17 DIAGNOSIS — N390 Urinary tract infection, site not specified: Secondary | ICD-10-CM | POA: Insufficient documentation

## 2016-06-17 DIAGNOSIS — Z789 Other specified health status: Secondary | ICD-10-CM | POA: Insufficient documentation

## 2016-06-17 DIAGNOSIS — D631 Anemia in chronic kidney disease: Secondary | ICD-10-CM | POA: Diagnosis not present

## 2016-06-17 DIAGNOSIS — Z9483 Pancreas transplant status: Secondary | ICD-10-CM | POA: Diagnosis not present

## 2016-06-17 DIAGNOSIS — E1129 Type 2 diabetes mellitus with other diabetic kidney complication: Secondary | ICD-10-CM | POA: Insufficient documentation

## 2016-06-17 DIAGNOSIS — Z79899 Other long term (current) drug therapy: Secondary | ICD-10-CM | POA: Insufficient documentation

## 2016-06-17 LAB — MAGNESIUM: MAGNESIUM: 1.7 mg/dL (ref 1.7–2.4)

## 2016-06-17 LAB — CBC WITH DIFFERENTIAL/PLATELET
BASOS ABS: 0 10*3/uL (ref 0–0.1)
BASOS PCT: 0 %
EOS ABS: 0.2 10*3/uL (ref 0–0.7)
Eosinophils Relative: 4 %
HCT: 35.3 % (ref 35.0–47.0)
HEMOGLOBIN: 12 g/dL (ref 12.0–16.0)
Lymphocytes Relative: 29 %
Lymphs Abs: 1.7 10*3/uL (ref 1.0–3.6)
MCH: 28 pg (ref 26.0–34.0)
MCHC: 34.1 g/dL (ref 32.0–36.0)
MCV: 82.2 fL (ref 80.0–100.0)
MONO ABS: 0.3 10*3/uL (ref 0.2–0.9)
MONOS PCT: 6 %
NEUTROS PCT: 61 %
Neutro Abs: 3.5 10*3/uL (ref 1.4–6.5)
Platelets: 173 10*3/uL (ref 150–440)
RBC: 4.29 MIL/uL (ref 3.80–5.20)
RDW: 14 % (ref 11.5–14.5)
WBC: 5.8 10*3/uL (ref 3.6–11.0)

## 2016-06-17 LAB — BASIC METABOLIC PANEL
Anion gap: 9 (ref 5–15)
BUN: 52 mg/dL — AB (ref 6–20)
CO2: 24 mmol/L (ref 22–32)
Calcium: 9.4 mg/dL (ref 8.9–10.3)
Chloride: 102 mmol/L (ref 101–111)
Creatinine, Ser: 1.9 mg/dL — ABNORMAL HIGH (ref 0.44–1.00)
GFR, EST AFRICAN AMERICAN: 34 mL/min — AB (ref >60)
GFR, EST NON AFRICAN AMERICAN: 30 mL/min — AB (ref >60)
Glucose, Bld: 173 mg/dL — ABNORMAL HIGH (ref 65–99)
POTASSIUM: 4.6 mmol/L (ref 3.5–5.1)
SODIUM: 135 mmol/L (ref 135–145)

## 2016-06-17 LAB — BILIRUBIN, TOTAL: Total Bilirubin: 0.9 mg/dL (ref 0.3–1.2)

## 2016-06-17 LAB — ALKALINE PHOSPHATASE: ALK PHOS: 132 U/L — AB (ref 38–126)

## 2016-06-17 LAB — AST: AST: 31 U/L (ref 15–41)

## 2016-06-17 LAB — GAMMA GT: GGT: 32 U/L (ref 7–50)

## 2016-06-17 LAB — ALBUMIN: Albumin: 4.4 g/dL (ref 3.5–5.0)

## 2016-06-17 LAB — ALT: ALT: 31 U/L (ref 14–54)

## 2016-06-17 LAB — PHOSPHORUS: PHOSPHORUS: 4.1 mg/dL (ref 2.5–4.6)

## 2016-06-17 LAB — TRIGLYCERIDES: Triglycerides: 63 mg/dL (ref <150)

## 2016-06-20 LAB — TACROLIMUS LEVEL: TACROLIMUS (FK506) - LABCORP: 7.8 ng/mL (ref 2.0–20.0)

## 2016-07-01 ENCOUNTER — Other Ambulatory Visit
Admission: RE | Admit: 2016-07-01 | Discharge: 2016-07-01 | Disposition: A | Discharge status: Home / Self Care | Payer: 59 | Source: Ambulatory Visit | Attending: Nephrology | Admitting: Nephrology

## 2016-07-01 DIAGNOSIS — Z79899 Other long term (current) drug therapy: Secondary | ICD-10-CM | POA: Insufficient documentation

## 2016-07-01 DIAGNOSIS — Z940 Kidney transplant status: Secondary | ICD-10-CM | POA: Insufficient documentation

## 2016-07-01 DIAGNOSIS — Z789 Other specified health status: Secondary | ICD-10-CM | POA: Diagnosis not present

## 2016-07-01 DIAGNOSIS — Z114 Encounter for screening for human immunodeficiency virus [HIV]: Secondary | ICD-10-CM | POA: Diagnosis not present

## 2016-07-01 DIAGNOSIS — N390 Urinary tract infection, site not specified: Secondary | ICD-10-CM | POA: Diagnosis not present

## 2016-07-01 DIAGNOSIS — D899 Disorder involving the immune mechanism, unspecified: Secondary | ICD-10-CM | POA: Insufficient documentation

## 2016-07-01 DIAGNOSIS — E559 Vitamin D deficiency, unspecified: Secondary | ICD-10-CM | POA: Diagnosis not present

## 2016-07-01 DIAGNOSIS — E1129 Type 2 diabetes mellitus with other diabetic kidney complication: Secondary | ICD-10-CM | POA: Insufficient documentation

## 2016-07-01 LAB — CBC WITH DIFFERENTIAL/PLATELET
Basophils Absolute: 0 10*3/uL (ref 0–0.1)
Basophils Relative: 0 %
Eosinophils Absolute: 0.3 10*3/uL (ref 0–0.7)
Eosinophils Relative: 5 %
HEMATOCRIT: 35.9 % (ref 35.0–47.0)
HEMOGLOBIN: 11.9 g/dL — AB (ref 12.0–16.0)
LYMPHS ABS: 1.6 10*3/uL (ref 1.0–3.6)
LYMPHS PCT: 32 %
MCH: 27.4 pg (ref 26.0–34.0)
MCHC: 33 g/dL (ref 32.0–36.0)
MCV: 83.2 fL (ref 80.0–100.0)
MONOS PCT: 8 %
Monocytes Absolute: 0.4 10*3/uL (ref 0.2–0.9)
Neutro Abs: 2.8 10*3/uL (ref 1.4–6.5)
Neutrophils Relative %: 55 %
Platelets: 143 10*3/uL — ABNORMAL LOW (ref 150–440)
RBC: 4.32 MIL/uL (ref 3.80–5.20)
RDW: 14.1 % (ref 11.5–14.5)
WBC: 5.2 10*3/uL (ref 3.6–11.0)

## 2016-07-01 LAB — BASIC METABOLIC PANEL
Anion gap: 8 (ref 5–15)
BUN: 47 mg/dL — AB (ref 6–20)
CALCIUM: 9.2 mg/dL (ref 8.9–10.3)
CO2: 24 mmol/L (ref 22–32)
CREATININE: 1.7 mg/dL — AB (ref 0.44–1.00)
Chloride: 104 mmol/L (ref 101–111)
GFR calc Af Amer: 39 mL/min — ABNORMAL LOW (ref >60)
GFR, EST NON AFRICAN AMERICAN: 34 mL/min — AB (ref >60)
GLUCOSE: 66 mg/dL (ref 65–99)
Potassium: 4.3 mmol/L (ref 3.5–5.1)
Sodium: 136 mmol/L (ref 135–145)

## 2016-07-01 LAB — ALBUMIN: ALBUMIN: 4.4 g/dL (ref 3.5–5.0)

## 2016-07-01 LAB — TRIGLYCERIDES: TRIGLYCERIDES: 70 mg/dL (ref <150)

## 2016-07-01 LAB — GAMMA GT: GGT: 30 U/L (ref 7–50)

## 2016-07-01 LAB — PHOSPHORUS: Phosphorus: 3.6 mg/dL (ref 2.5–4.6)

## 2016-07-01 LAB — ALT: ALT: 23 U/L (ref 14–54)

## 2016-07-01 LAB — HDL CHOLESTEROL: HDL: 60 mg/dL (ref >40)

## 2016-07-01 LAB — CHOLESTEROL, TOTAL: CHOLESTEROL: 144 mg/dL (ref 0–200)

## 2016-07-01 LAB — MAGNESIUM: Magnesium: 1.9 mg/dL (ref 1.7–2.4)

## 2016-07-01 LAB — ALKALINE PHOSPHATASE: Alkaline Phosphatase: 130 U/L — ABNORMAL HIGH (ref 38–126)

## 2016-07-01 LAB — AST: AST: 26 U/L (ref 15–41)

## 2016-07-01 LAB — BILIRUBIN, TOTAL: Total Bilirubin: 0.6 mg/dL (ref 0.3–1.2)

## 2016-07-02 LAB — LDL CHOLESTEROL, DIRECT: LDL DIRECT: 73 mg/dL (ref 0–99)

## 2016-07-03 LAB — TACROLIMUS LEVEL: TACROLIMUS (FK506) - LABCORP: 7.3 ng/mL (ref 2.0–20.0)

## 2016-08-19 ENCOUNTER — Other Ambulatory Visit
Admission: RE | Admit: 2016-08-19 | Discharge: 2016-08-19 | Disposition: A | Discharge status: Home / Self Care | Payer: 59 | Source: Ambulatory Visit | Attending: Nephrology | Admitting: Nephrology

## 2016-08-19 DIAGNOSIS — D631 Anemia in chronic kidney disease: Secondary | ICD-10-CM | POA: Insufficient documentation

## 2016-08-19 DIAGNOSIS — Z09 Encounter for follow-up examination after completed treatment for conditions other than malignant neoplasm: Secondary | ICD-10-CM | POA: Diagnosis not present

## 2016-08-19 DIAGNOSIS — E1129 Type 2 diabetes mellitus with other diabetic kidney complication: Secondary | ICD-10-CM | POA: Diagnosis not present

## 2016-08-19 DIAGNOSIS — Z789 Other specified health status: Secondary | ICD-10-CM | POA: Diagnosis not present

## 2016-08-19 DIAGNOSIS — Z79899 Other long term (current) drug therapy: Secondary | ICD-10-CM | POA: Insufficient documentation

## 2016-08-19 DIAGNOSIS — Z114 Encounter for screening for human immunodeficiency virus [HIV]: Secondary | ICD-10-CM | POA: Diagnosis not present

## 2016-08-19 DIAGNOSIS — B259 Cytomegaloviral disease, unspecified: Secondary | ICD-10-CM | POA: Insufficient documentation

## 2016-08-19 DIAGNOSIS — D899 Disorder involving the immune mechanism, unspecified: Secondary | ICD-10-CM | POA: Insufficient documentation

## 2016-08-19 DIAGNOSIS — N390 Urinary tract infection, site not specified: Secondary | ICD-10-CM | POA: Insufficient documentation

## 2016-08-19 DIAGNOSIS — E559 Vitamin D deficiency, unspecified: Secondary | ICD-10-CM | POA: Insufficient documentation

## 2016-08-19 DIAGNOSIS — Z940 Kidney transplant status: Secondary | ICD-10-CM | POA: Insufficient documentation

## 2016-08-19 LAB — MAGNESIUM: Magnesium: 1.7 mg/dL (ref 1.7–2.4)

## 2016-08-19 LAB — CBC
HEMATOCRIT: 34 % — AB (ref 35.0–47.0)
HEMOGLOBIN: 11.2 g/dL — AB (ref 12.0–16.0)
MCH: 27 pg (ref 26.0–34.0)
MCHC: 33 g/dL (ref 32.0–36.0)
MCV: 81.8 fL (ref 80.0–100.0)
Platelets: 170 10*3/uL (ref 150–440)
RBC: 4.16 MIL/uL (ref 3.80–5.20)
RDW: 14.1 % (ref 11.5–14.5)
WBC: 4.8 10*3/uL (ref 3.6–11.0)

## 2016-08-19 LAB — BASIC METABOLIC PANEL
Anion gap: 6 (ref 5–15)
BUN: 40 mg/dL — AB (ref 6–20)
CALCIUM: 9.4 mg/dL (ref 8.9–10.3)
CHLORIDE: 105 mmol/L (ref 101–111)
CO2: 27 mmol/L (ref 22–32)
CREATININE: 1.94 mg/dL — AB (ref 0.44–1.00)
GFR calc non Af Amer: 29 mL/min — ABNORMAL LOW (ref >60)
GFR, EST AFRICAN AMERICAN: 34 mL/min — AB (ref >60)
Glucose, Bld: 125 mg/dL — ABNORMAL HIGH (ref 65–99)
Potassium: 4.7 mmol/L (ref 3.5–5.1)
SODIUM: 138 mmol/L (ref 135–145)

## 2016-08-19 LAB — PHOSPHORUS: Phosphorus: 3.7 mg/dL (ref 2.5–4.6)

## 2016-08-21 LAB — TACROLIMUS LEVEL: Tacrolimus (FK506) - LabCorp: 7.7 ng/mL (ref 2.0–20.0)

## 2016-09-23 ENCOUNTER — Other Ambulatory Visit
Admission: RE | Admit: 2016-09-23 | Discharge: 2016-09-23 | Disposition: A | Discharge status: Home / Self Care | Payer: 59 | Source: Ambulatory Visit | Attending: Nephrology | Admitting: Nephrology

## 2016-09-23 DIAGNOSIS — N390 Urinary tract infection, site not specified: Secondary | ICD-10-CM | POA: Diagnosis not present

## 2016-09-23 DIAGNOSIS — E1129 Type 2 diabetes mellitus with other diabetic kidney complication: Secondary | ICD-10-CM | POA: Insufficient documentation

## 2016-09-23 DIAGNOSIS — T8610 Unspecified complication of kidney transplant: Secondary | ICD-10-CM | POA: Diagnosis not present

## 2016-09-23 DIAGNOSIS — Z09 Encounter for follow-up examination after completed treatment for conditions other than malignant neoplasm: Secondary | ICD-10-CM | POA: Insufficient documentation

## 2016-09-23 DIAGNOSIS — D899 Disorder involving the immune mechanism, unspecified: Secondary | ICD-10-CM | POA: Insufficient documentation

## 2016-09-23 DIAGNOSIS — Z789 Other specified health status: Secondary | ICD-10-CM | POA: Diagnosis not present

## 2016-09-23 DIAGNOSIS — Z940 Kidney transplant status: Secondary | ICD-10-CM | POA: Insufficient documentation

## 2016-09-23 DIAGNOSIS — Z79899 Other long term (current) drug therapy: Secondary | ICD-10-CM | POA: Insufficient documentation

## 2016-09-23 DIAGNOSIS — N189 Chronic kidney disease, unspecified: Secondary | ICD-10-CM | POA: Insufficient documentation

## 2016-09-23 DIAGNOSIS — Z114 Encounter for screening for human immunodeficiency virus [HIV]: Secondary | ICD-10-CM | POA: Diagnosis not present

## 2016-09-23 DIAGNOSIS — B259 Cytomegaloviral disease, unspecified: Secondary | ICD-10-CM | POA: Diagnosis not present

## 2016-09-23 DIAGNOSIS — D631 Anemia in chronic kidney disease: Secondary | ICD-10-CM | POA: Diagnosis not present

## 2016-09-23 DIAGNOSIS — E559 Vitamin D deficiency, unspecified: Secondary | ICD-10-CM | POA: Insufficient documentation

## 2016-09-23 DIAGNOSIS — Z9483 Pancreas transplant status: Secondary | ICD-10-CM | POA: Insufficient documentation

## 2016-09-23 LAB — BILIRUBIN, TOTAL: Total Bilirubin: 1.4 mg/dL — ABNORMAL HIGH (ref 0.3–1.2)

## 2016-09-23 LAB — BASIC METABOLIC PANEL
ANION GAP: 8 (ref 5–15)
BUN: 33 mg/dL — ABNORMAL HIGH (ref 6–20)
CHLORIDE: 103 mmol/L (ref 101–111)
CO2: 26 mmol/L (ref 22–32)
Calcium: 9.5 mg/dL (ref 8.9–10.3)
Creatinine, Ser: 1.74 mg/dL — ABNORMAL HIGH (ref 0.44–1.00)
GFR calc Af Amer: 38 mL/min — ABNORMAL LOW (ref >60)
GFR calc non Af Amer: 33 mL/min — ABNORMAL LOW (ref >60)
GLUCOSE: 95 mg/dL (ref 65–99)
POTASSIUM: 4.1 mmol/L (ref 3.5–5.1)
Sodium: 137 mmol/L (ref 135–145)

## 2016-09-23 LAB — CBC WITH DIFFERENTIAL/PLATELET
BASOS ABS: 0 10*3/uL (ref 0–0.1)
Basophils Relative: 1 %
EOS PCT: 3 %
Eosinophils Absolute: 0.2 10*3/uL (ref 0–0.7)
HEMATOCRIT: 34.3 % — AB (ref 35.0–47.0)
Hemoglobin: 11.3 g/dL — ABNORMAL LOW (ref 12.0–16.0)
LYMPHS PCT: 25 %
Lymphs Abs: 1.4 10*3/uL (ref 1.0–3.6)
MCH: 27.1 pg (ref 26.0–34.0)
MCHC: 33 g/dL (ref 32.0–36.0)
MCV: 82 fL (ref 80.0–100.0)
MONO ABS: 0.4 10*3/uL (ref 0.2–0.9)
MONOS PCT: 6 %
Neutro Abs: 3.8 10*3/uL (ref 1.4–6.5)
Neutrophils Relative %: 65 %
PLATELETS: 169 10*3/uL (ref 150–440)
RBC: 4.18 MIL/uL (ref 3.80–5.20)
RDW: 14.1 % (ref 11.5–14.5)
WBC: 5.8 10*3/uL (ref 3.6–11.0)

## 2016-09-23 LAB — PHOSPHORUS: Phosphorus: 3.8 mg/dL (ref 2.5–4.6)

## 2016-09-23 LAB — ALT: ALT: 19 U/L (ref 14–54)

## 2016-09-23 LAB — AST: AST: 27 U/L (ref 15–41)

## 2016-09-23 LAB — MAGNESIUM: MAGNESIUM: 1.5 mg/dL — AB (ref 1.7–2.4)

## 2016-09-23 LAB — ALBUMIN: Albumin: 4.4 g/dL (ref 3.5–5.0)

## 2016-09-23 LAB — CHOLESTEROL, TOTAL: Cholesterol: 144 mg/dL (ref 0–200)

## 2016-09-23 LAB — GAMMA GT: GGT: 23 U/L (ref 7–50)

## 2016-09-23 LAB — TRIGLYCERIDES: TRIGLYCERIDES: 58 mg/dL (ref <150)

## 2016-09-23 LAB — ALKALINE PHOSPHATASE: Alkaline Phosphatase: 100 U/L (ref 38–126)

## 2016-09-25 LAB — TACROLIMUS LEVEL: TACROLIMUS (FK506) - LABCORP: 5.8 ng/mL (ref 2.0–20.0)

## 2016-10-09 ENCOUNTER — Other Ambulatory Visit: Payer: Self-pay | Admitting: Obstetrics and Gynecology

## 2016-10-09 DIAGNOSIS — Z1231 Encounter for screening mammogram for malignant neoplasm of breast: Secondary | ICD-10-CM

## 2016-10-11 ENCOUNTER — Ambulatory Visit
Admission: RE | Admit: 2016-10-11 | Discharge: 2016-10-11 | Disposition: A | Discharge status: Home / Self Care | Payer: 59 | Source: Ambulatory Visit | Attending: Obstetrics and Gynecology | Admitting: Obstetrics and Gynecology

## 2016-10-11 ENCOUNTER — Encounter: Payer: Self-pay | Admitting: Radiology

## 2016-10-11 DIAGNOSIS — Z1231 Encounter for screening mammogram for malignant neoplasm of breast: Secondary | ICD-10-CM

## 2016-10-17 ENCOUNTER — Encounter: Payer: Self-pay | Admitting: *Deleted

## 2016-10-17 ENCOUNTER — Telehealth: Payer: Self-pay | Admitting: *Deleted

## 2016-10-19 ENCOUNTER — Encounter
Admission: RE | Disposition: A | Discharge status: Home / Self Care | Payer: Self-pay | Source: Ambulatory Visit | Attending: Gastroenterology

## 2016-10-19 ENCOUNTER — Ambulatory Visit
Admission: RE | Admit: 2016-10-19 | Discharge: 2016-10-19 | Disposition: A | Discharge status: Home / Self Care | Payer: 59 | Source: Ambulatory Visit | Attending: Gastroenterology | Admitting: Gastroenterology

## 2016-10-19 ENCOUNTER — Encounter: Payer: Self-pay | Admitting: *Deleted

## 2016-10-19 ENCOUNTER — Ambulatory Visit: Payer: 59 | Admitting: Anesthesiology

## 2016-10-19 DIAGNOSIS — E669 Obesity, unspecified: Secondary | ICD-10-CM | POA: Insufficient documentation

## 2016-10-19 DIAGNOSIS — M810 Age-related osteoporosis without current pathological fracture: Secondary | ICD-10-CM | POA: Insufficient documentation

## 2016-10-19 DIAGNOSIS — K219 Gastro-esophageal reflux disease without esophagitis: Secondary | ICD-10-CM | POA: Diagnosis not present

## 2016-10-19 DIAGNOSIS — N189 Chronic kidney disease, unspecified: Secondary | ICD-10-CM | POA: Diagnosis not present

## 2016-10-19 DIAGNOSIS — Z1211 Encounter for screening for malignant neoplasm of colon: Secondary | ICD-10-CM | POA: Diagnosis not present

## 2016-10-19 DIAGNOSIS — E785 Hyperlipidemia, unspecified: Secondary | ICD-10-CM | POA: Diagnosis not present

## 2016-10-19 DIAGNOSIS — E0590 Thyrotoxicosis, unspecified without thyrotoxic crisis or storm: Secondary | ICD-10-CM | POA: Insufficient documentation

## 2016-10-19 DIAGNOSIS — I129 Hypertensive chronic kidney disease with stage 1 through stage 4 chronic kidney disease, or unspecified chronic kidney disease: Secondary | ICD-10-CM | POA: Diagnosis not present

## 2016-10-19 DIAGNOSIS — Z79899 Other long term (current) drug therapy: Secondary | ICD-10-CM | POA: Diagnosis not present

## 2016-10-19 DIAGNOSIS — Z794 Long term (current) use of insulin: Secondary | ICD-10-CM | POA: Diagnosis not present

## 2016-10-19 DIAGNOSIS — E1140 Type 2 diabetes mellitus with diabetic neuropathy, unspecified: Secondary | ICD-10-CM | POA: Insufficient documentation

## 2016-10-19 DIAGNOSIS — Z7982 Long term (current) use of aspirin: Secondary | ICD-10-CM | POA: Diagnosis not present

## 2016-10-19 DIAGNOSIS — Z8673 Personal history of transient ischemic attack (TIA), and cerebral infarction without residual deficits: Secondary | ICD-10-CM | POA: Insufficient documentation

## 2016-10-19 DIAGNOSIS — E1122 Type 2 diabetes mellitus with diabetic chronic kidney disease: Secondary | ICD-10-CM | POA: Insufficient documentation

## 2016-10-19 DIAGNOSIS — Z6829 Body mass index (BMI) 29.0-29.9, adult: Secondary | ICD-10-CM | POA: Insufficient documentation

## 2016-10-19 HISTORY — DX: Type 2 diabetes mellitus with diabetic neuropathy, unspecified: E11.40

## 2016-10-19 HISTORY — DX: Obesity, unspecified: E66.9

## 2016-10-19 HISTORY — DX: Gastro-esophageal reflux disease without esophagitis: K21.9

## 2016-10-19 HISTORY — DX: Age-related osteoporosis without current pathological fracture: M81.0

## 2016-10-19 HISTORY — DX: Hyperlipidemia, unspecified: E78.5

## 2016-10-19 HISTORY — DX: Tremor, unspecified: R25.1

## 2016-10-19 HISTORY — DX: Chronic sinusitis, unspecified: J32.9

## 2016-10-19 HISTORY — DX: Presence of insulin pump (external) (internal): Z96.41

## 2016-10-19 HISTORY — PX: COLONOSCOPY WITH PROPOFOL: SHX5780

## 2016-10-19 HISTORY — DX: Nausea with vomiting, unspecified: R11.2

## 2016-10-19 HISTORY — DX: Chronic kidney disease, unspecified: N18.9

## 2016-10-19 HISTORY — DX: Thyrotoxicosis, unspecified without thyrotoxic crisis or storm: E05.90

## 2016-10-19 LAB — GLUCOSE, CAPILLARY: GLUCOSE-CAPILLARY: 212 mg/dL — AB (ref 65–99)

## 2016-10-19 SURGERY — COLONOSCOPY WITH PROPOFOL
Anesthesia: General

## 2016-10-19 MED ORDER — LIDOCAINE 2% (20 MG/ML) 5 ML SYRINGE
INTRAMUSCULAR | Status: DC | PRN
  Administered 2016-10-19: 40 mg via INTRAVENOUS

## 2016-10-19 MED ORDER — SODIUM CHLORIDE 0.9 % IV SOLN
INTRAVENOUS | Status: DC | PRN
  Administered 2016-10-19: 08:00:00 via INTRAVENOUS

## 2016-10-19 MED ORDER — FENTANYL CITRATE (PF) 100 MCG/2ML IJ SOLN
INTRAMUSCULAR | Status: DC | PRN
  Administered 2016-10-19: 50 ug via INTRAVENOUS

## 2016-10-19 MED ORDER — GLYCOPYRROLATE 0.2 MG/ML IJ SOLN
INTRAMUSCULAR | Status: DC | PRN
  Administered 2016-10-19: 0.1 mg via INTRAVENOUS

## 2016-10-19 MED ORDER — PROPOFOL 10 MG/ML IV BOLUS
INTRAVENOUS | Status: DC | PRN
  Administered 2016-10-19: 100 mg via INTRAVENOUS

## 2016-10-19 MED ORDER — SODIUM CHLORIDE 0.9 % IV SOLN: INTRAVENOUS | Status: DC

## 2016-10-19 MED ORDER — MIDAZOLAM HCL 5 MG/5ML IJ SOLN
INTRAMUSCULAR | Status: DC | PRN
  Administered 2016-10-19: 1 mg via INTRAVENOUS

## 2016-10-19 MED ORDER — PROPOFOL 500 MG/50ML IV EMUL
INTRAVENOUS | Status: DC | PRN
  Administered 2016-10-19: 140 ug/kg/min via INTRAVENOUS

## 2016-10-19 NOTE — H&P (Signed)
Outpatient short stay form Pre-procedure 10/19/2016 7:44 AM Lollie Sails MD  Primary Physician: Dr Derinda Late  Reason for visit:  Colonoscopy  History of present illness:  Patient is a 50 year old female presenting as above. She tolerated her prep well. She does take a 325 mg aspirin daily but is held that for about 5 days. She takes no other blood thinning medications or aspirin products.    Current Facility-Administered Medications:  .  0.9 %  sodium chloride infusion, , Intravenous, Continuous, Lollie Sails, MD  Facility-Administered Medications Ordered in Other Encounters:  .  0.9 %  sodium chloride infusion, , , Continuous PRN, Courtney Paris, CRNA  Prescriptions Prior to Admission  Medication Sig Dispense Refill Last Dose  . aspirin 325 MG EC tablet Take 325 mg by mouth daily.   10/13/2016  . atorvastatin (LIPITOR) 40 MG tablet Take 40 mg by mouth daily.   10/18/2016 at Unknown time  . Azelastine & Fluticasone 137 & 50 MCG/ACT THPK Place into the nose.   10/18/2016 at Unknown time  . Biotin 1 MG CAPS Take by mouth daily.   Past Month at Unknown time  . fexofenadine (ALLEGRA) 180 MG tablet Take 180 mg by mouth daily.   10/18/2016 at Unknown time  . fluticasone (FLONASE) 50 MCG/ACT nasal spray Place into both nostrils daily.   10/18/2016 at Unknown time  . insulin aspart (NOVOLOG) 100 UNIT/ML injection Inject into the vein 3 (three) times daily before meals.   10/19/2016 at Unknown time  . lisinopril (PRINIVIL,ZESTRIL) 5 MG tablet Take 5 mg by mouth daily.   10/18/2016 at Unknown time  . mycophenolate (CELLCEPT) 250 MG capsule Take by mouth 2 (two) times daily.   10/18/2016 at Unknown time  . tacrolimus (PROGRAF) 1 MG capsule Take 1 mg by mouth 2 (two) times daily.   10/18/2016 at Unknown time  . aspirin 81 MG tablet Take 81 mg by mouth daily.   Not Taking at Unknown time  . Biotin 5 MG CAPS Take by mouth.   Not Taking at Unknown time  . Cholecalciferol (VITAMIN D3)  2000 UNITS CHEW Chew by mouth.   10/13/2016  . Cholecalciferol 4000 UNITS CAPS Take by mouth.     . clopidogrel (PLAVIX) 75 MG tablet Take 75 mg by mouth daily.   Not Taking at Unknown time  . docusate sodium (COLACE) 100 MG capsule Take 100 mg by mouth 2 (two) times daily.   Not Taking at Unknown time  . glucagon 1 MG injection Inject 1 mg into the vein once as needed.     . magnesium oxide (MAG-OX) 400 MG tablet Take 400 mg by mouth daily.   Not Taking at Unknown time     Allergies  Allergen Reactions  . Erythromycin   . Floxin [Ofloxacin]   . Latex   . Levaquin [Levofloxacin In D5w]   . Moxifloxacin   . Sulfa Antibiotics   . Tape     ADHESIVE      Past Medical History:  Diagnosis Date  . Chronic kidney disease   . Chronic sinusitis   . Diabetes mellitus without complication (HCC)    J6B, 7.0 last check; type 1, has insulin pump; subsequently has neuropathy;   . Diabetic neuropathy ([place])   . GERD (gastroesophageal reflux disease)   . Hyperlipemia   . Hypertension    reports it goes either too high or too low; taking lisinopril but hasn't been regulated.  . Hyperthyroidism   .  Insulin pump in place   . Nausea and vomiting   . Obesity   . Osteoporosis   . Stroke ([place])   . Tremor     Review of systems:      Physical Exam    Heart and lungs: Regular rate and rhythm without rub or gallop, lungs are bilaterally clear.    HEENT: Normocephalic atraumatic eyes are anicteric    Other:     Pertinant exam for procedure: Soft nontender nondistended bowel sounds positive normoactive.    Planned proceedures: Colonoscopy and indicated procedures. I have discussed the risks benefits and complications of procedures to include not limited to bleeding, infection, perforation and the risk of sedation and the patient wishes to proceed.    Lollie Sails, MD Gastroenterology 10/19/2016  7:44 AM

## 2016-10-19 NOTE — Anesthesia Preprocedure Evaluation (Signed)
Anesthesia Evaluation  Patient identified by MRN, date of birth, ID band Patient awake    Reviewed: Allergy & Precautions, H&P , NPO status , Patient's Chart, lab work & pertinent test results  History of Anesthesia Complications (+) PONV and history of anesthetic complications  Airway Mallampati: I  TM Distance: >3 FB Neck ROM: full    Dental  (+) Chipped   Pulmonary neg pulmonary ROS, neg shortness of breath,    Pulmonary exam normal breath sounds clear to auscultation       Cardiovascular Exercise Tolerance: Good hypertension, (-) angina(-) Past MI and (-) DOE Normal cardiovascular exam Rhythm:regular Rate:Normal     Neuro/Psych CVA, Residual Symptoms negative psych ROS   GI/Hepatic Neg liver ROS, GERD  Controlled,  Endo/Other  diabetes (on pump), Type 2, Insulin DependentHyperthyroidism   Renal/GU Renal disease  negative genitourinary   Musculoskeletal   Abdominal   Peds  Hematology negative hematology ROS (+)   Anesthesia Other Findings Past Medical History: No date: Chronic kidney disease No date: Chronic sinusitis No date: Diabetes mellitus without complication (HCC)     Comment: A1c, 7.0 last check; type 1, has insulin pump;              subsequently has neuropathy;  No date: Diabetic neuropathy (HCC) No date: GERD (gastroesophageal reflux disease) No date: Hyperlipemia No date: Hypertension     Comment: reports it goes either too high or too low;               taking lisinopril but hasn't been regulated. No date: Hyperthyroidism No date: Insulin pump in place No date: Nausea and vomiting No date: Obesity No date: Osteoporosis No date: Stroke [facility]) No date: Tremor  Past Surgical History: No date: CATARACT EXTRACTION No date: EYE SURGERY     Comment: bilateral (cataracts) No date: FOOT SURGERY No date: FOOT SURGERY 2007: KIDNEY TRANSPLANT  BMI    Body Mass Index:  29.58 kg/m      Reproductive/Obstetrics negative OB ROS                             Anesthesia Physical Anesthesia Plan  ASA: III  Anesthesia Plan: General   Post-op Pain Management:    Induction:   Airway Management Planned:   Additional Equipment:   Intra-op Plan:   Post-operative Plan:   Informed Consent: I have reviewed the patients History and Physical, chart, labs and discussed the procedure including the risks, benefits and alternatives for the proposed anesthesia with the patient or authorized representative who has indicated his/her understanding and acceptance.   Dental Advisory Given  Plan Discussed with: Anesthesiologist, CRNA and Surgeon  Anesthesia Plan Comments:         Anesthesia Quick Evaluation

## 2016-10-19 NOTE — Anesthesia Postprocedure Evaluation (Signed)
Anesthesia Post Note  Patient: Brandi Carroll  Procedure(s) Performed: Procedure(s) (LRB): COLONOSCOPY WITH PROPOFOL (N/A)  Patient location during evaluation: Endoscopy Anesthesia Type: General Level of consciousness: awake and alert Pain management: pain level controlled Vital Signs Assessment: post-procedure vital signs reviewed and stable Respiratory status: spontaneous breathing, nonlabored ventilation, respiratory function stable and patient connected to nasal cannula oxygen Cardiovascular status: blood pressure returned to baseline and stable Postop Assessment: no signs of nausea or vomiting Anesthetic complications: no    Last Vitals:  Vitals:   10/19/16 0901 10/19/16 0930  BP: (!) 159/84 (!) 173/83  Pulse:    Resp:    Temp:      Last Pain:  Vitals:   10/19/16 0853  TempSrc: Tympanic                 Precious Haws Piscitello

## 2016-10-19 NOTE — Op Note (Signed)
[facility] Gastroenterology Patient Name: Brandi Carroll Procedure Date: 10/19/2016 7:30 AM MRN: [id number] Account #: [id number] Date of Birth: [birth year] Admit Type: Outpatient Age: 50 Room: [facility] ENDO ROOM 1 Gender: Female Note Status: Finalized Procedure:            Colonoscopy Indications:          Screening for colorectal malignant neoplasm Providers:            Lollie Sails, MD Referring MD:         Caprice Renshaw MD (Referring MD) Medicines:            Monitored Anesthesia Care Complications:        No immediate complications. Procedure:            Pre-Anesthesia Assessment:                       - ASA Grade Assessment: III - A patient with severe                        systemic disease.                       After obtaining informed consent, the colonoscope was                        passed under direct vision. Throughout the procedure,                        the patient's blood pressure, pulse, and oxygen                        saturations were monitored continuously. The                        Colonoscope was introduced through the anus and                        advanced to the the cecum, identified by appendiceal                        orifice and ileocecal valve. The colonoscopy was                        performed with moderate difficulty due to a tortuous                        colon. Successful completion of the procedure was aided                        by changing the patient to a supine positionwith                        minimal abdominal support. Findings:      The mucosa of the right colon appeared occasionally mildly nodular,       compared to a more normal appearance of the left colon. Biopsies were       taken of the right and left colon mucosa.      The exam was otherwise without abnormality.      The digital rectal exam was normal. Impression:           -  The examination was otherwise normal. Recommendation:       -  Discharge patient to home.                       - Await pathology results.                       - Telephone GI clinic for pathology results in 1 week. Procedure Code(s):    --- Professional ---                       [phone], Colonoscopy, flexible; diagnostic, including                        collection of specimen(s) by brushing or washing, when                        performed (separate procedure) Diagnosis Code(s):    --- Professional ---                       Z12.11, Encounter for screening for malignant neoplasm                        of colon CPT copyright 2016 American Medical Association. All rights reserved. The codes documented in this report are preliminary and upon coder review may  be revised to meet current compliance requirements. Lollie Sails, MD 10/19/2016 8:49:25 AM This report has been signed electronically. Number of Addenda: 0 Note Initiated On: 10/19/2016 7:30 AM Scope Withdrawal Time: 0 hours 7 minutes 39 seconds  Total Procedure Duration: 0 hours 43 minutes 26 seconds       [facility]

## 2016-10-19 NOTE — Transfer of Care (Signed)
Immediate Anesthesia Transfer of Care Note  Patient: Brandi Carroll  Procedure(s) Performed: Procedure(s): COLONOSCOPY WITH PROPOFOL (N/A)  Patient Location: PACU and Endoscopy Unit  Anesthesia Type:General  Level of Consciousness: awake, oriented and patient cooperative  Airway & Oxygen Therapy: Patient Spontanous Breathing and Patient connected to nasal cannula oxygen  Post-op Assessment: Report given to RN  Post vital signs: Reviewed and stable  Last Vitals:  Vitals:   10/19/16 0720  BP: (!) 166/67  Pulse: 95  Resp: 18  Temp: (!) 35.8 C    Last Pain:  Vitals:   10/19/16 0720  TempSrc: Tympanic         Complications: No apparent anesthesia complications

## 2016-10-20 ENCOUNTER — Encounter: Payer: Self-pay | Admitting: Gastroenterology

## 2016-10-22 LAB — SURGICAL PATHOLOGY

## 2016-11-18 ENCOUNTER — Other Ambulatory Visit
Admission: RE | Admit: 2016-11-18 | Discharge: 2016-11-18 | Disposition: A | Discharge status: Home / Self Care | Payer: 59 | Source: Ambulatory Visit | Attending: Nephrology | Admitting: Nephrology

## 2016-11-18 DIAGNOSIS — E559 Vitamin D deficiency, unspecified: Secondary | ICD-10-CM | POA: Diagnosis not present

## 2016-11-18 DIAGNOSIS — B259 Cytomegaloviral disease, unspecified: Secondary | ICD-10-CM | POA: Insufficient documentation

## 2016-11-18 DIAGNOSIS — Z09 Encounter for follow-up examination after completed treatment for conditions other than malignant neoplasm: Secondary | ICD-10-CM | POA: Insufficient documentation

## 2016-11-18 DIAGNOSIS — N390 Urinary tract infection, site not specified: Secondary | ICD-10-CM | POA: Diagnosis not present

## 2016-11-18 DIAGNOSIS — N189 Chronic kidney disease, unspecified: Secondary | ICD-10-CM | POA: Insufficient documentation

## 2016-11-18 DIAGNOSIS — D631 Anemia in chronic kidney disease: Secondary | ICD-10-CM | POA: Insufficient documentation

## 2016-11-18 DIAGNOSIS — Z789 Other specified health status: Secondary | ICD-10-CM | POA: Insufficient documentation

## 2016-11-18 DIAGNOSIS — Z9483 Pancreas transplant status: Secondary | ICD-10-CM | POA: Insufficient documentation

## 2016-11-18 DIAGNOSIS — E1129 Type 2 diabetes mellitus with other diabetic kidney complication: Secondary | ICD-10-CM | POA: Diagnosis not present

## 2016-11-18 DIAGNOSIS — Z114 Encounter for screening for human immunodeficiency virus [HIV]: Secondary | ICD-10-CM | POA: Diagnosis not present

## 2016-11-18 DIAGNOSIS — Z940 Kidney transplant status: Secondary | ICD-10-CM | POA: Insufficient documentation

## 2016-11-18 DIAGNOSIS — D899 Disorder involving the immune mechanism, unspecified: Secondary | ICD-10-CM | POA: Insufficient documentation

## 2016-11-18 DIAGNOSIS — Z79899 Other long term (current) drug therapy: Secondary | ICD-10-CM | POA: Insufficient documentation

## 2016-11-18 LAB — BASIC METABOLIC PANEL
ANION GAP: 8 (ref 5–15)
BUN: 41 mg/dL — ABNORMAL HIGH (ref 6–20)
CHLORIDE: 105 mmol/L (ref 101–111)
CO2: 22 mmol/L (ref 22–32)
Calcium: 9.7 mg/dL (ref 8.9–10.3)
Creatinine, Ser: 1.74 mg/dL — ABNORMAL HIGH (ref 0.44–1.00)
GFR calc Af Amer: 38 mL/min — ABNORMAL LOW (ref >60)
GFR, EST NON AFRICAN AMERICAN: 33 mL/min — AB (ref >60)
Glucose, Bld: 127 mg/dL — ABNORMAL HIGH (ref 65–99)
POTASSIUM: 4.9 mmol/L (ref 3.5–5.1)
SODIUM: 135 mmol/L (ref 135–145)

## 2016-11-18 LAB — CBC WITH DIFFERENTIAL/PLATELET
BASOS ABS: 0 10*3/uL (ref 0–0.1)
Basophils Relative: 1 %
Eosinophils Absolute: 0.1 10*3/uL (ref 0–0.7)
Eosinophils Relative: 4 %
HEMATOCRIT: 32.9 % — AB (ref 35.0–47.0)
HEMOGLOBIN: 10.8 g/dL — AB (ref 12.0–16.0)
LYMPHS PCT: 44 %
Lymphs Abs: 1.6 10*3/uL (ref 1.0–3.6)
MCH: 27.2 pg (ref 26.0–34.0)
MCHC: 32.7 g/dL (ref 32.0–36.0)
MCV: 83.1 fL (ref 80.0–100.0)
MONO ABS: 0.5 10*3/uL (ref 0.2–0.9)
Monocytes Relative: 13 %
NEUTROS ABS: 1.4 10*3/uL (ref 1.4–6.5)
NEUTROS PCT: 38 %
Platelets: 207 10*3/uL (ref 150–440)
RBC: 3.96 MIL/uL (ref 3.80–5.20)
RDW: 14.1 % (ref 11.5–14.5)
WBC: 3.6 10*3/uL (ref 3.6–11.0)

## 2016-11-18 LAB — PHOSPHORUS: PHOSPHORUS: 4 mg/dL (ref 2.5–4.6)

## 2016-11-18 LAB — MAGNESIUM: Magnesium: 1.7 mg/dL (ref 1.7–2.4)

## 2016-11-20 LAB — TACROLIMUS LEVEL: Tacrolimus (FK506) - LabCorp: 4.2 ng/mL (ref 2.0–20.0)

## 2016-12-30 ENCOUNTER — Other Ambulatory Visit
Admission: RE | Admit: 2016-12-30 | Discharge: 2016-12-30 | Disposition: A | Discharge status: Home / Self Care | Payer: 59 | Source: Ambulatory Visit | Attending: Nephrology | Admitting: Nephrology

## 2016-12-30 DIAGNOSIS — Z789 Other specified health status: Secondary | ICD-10-CM | POA: Insufficient documentation

## 2016-12-30 DIAGNOSIS — Z114 Encounter for screening for human immunodeficiency virus [HIV]: Secondary | ICD-10-CM | POA: Diagnosis not present

## 2016-12-30 DIAGNOSIS — D899 Disorder involving the immune mechanism, unspecified: Secondary | ICD-10-CM | POA: Diagnosis not present

## 2016-12-30 DIAGNOSIS — E559 Vitamin D deficiency, unspecified: Secondary | ICD-10-CM | POA: Diagnosis not present

## 2016-12-30 DIAGNOSIS — Z940 Kidney transplant status: Secondary | ICD-10-CM | POA: Diagnosis not present

## 2016-12-30 DIAGNOSIS — N390 Urinary tract infection, site not specified: Secondary | ICD-10-CM | POA: Insufficient documentation

## 2016-12-30 DIAGNOSIS — Z9483 Pancreas transplant status: Secondary | ICD-10-CM | POA: Diagnosis not present

## 2016-12-30 DIAGNOSIS — D631 Anemia in chronic kidney disease: Secondary | ICD-10-CM | POA: Diagnosis not present

## 2016-12-30 DIAGNOSIS — Z79899 Other long term (current) drug therapy: Secondary | ICD-10-CM | POA: Diagnosis not present

## 2016-12-30 LAB — CBC WITH DIFFERENTIAL/PLATELET
BASOS ABS: 0 10*3/uL (ref 0–0.1)
BASOS PCT: 1 %
EOS ABS: 0.2 10*3/uL (ref 0–0.7)
EOS PCT: 4 %
HCT: 34.9 % — ABNORMAL LOW (ref 35.0–47.0)
Hemoglobin: 11.3 g/dL — ABNORMAL LOW (ref 12.0–16.0)
Lymphocytes Relative: 33 %
Lymphs Abs: 1.9 10*3/uL (ref 1.0–3.6)
MCH: 26.8 pg (ref 26.0–34.0)
MCHC: 32.2 g/dL (ref 32.0–36.0)
MCV: 83.1 fL (ref 80.0–100.0)
MONO ABS: 0.3 10*3/uL (ref 0.2–0.9)
Monocytes Relative: 6 %
Neutro Abs: 3.2 10*3/uL (ref 1.4–6.5)
Neutrophils Relative %: 56 %
PLATELETS: 188 10*3/uL (ref 150–440)
RBC: 4.2 MIL/uL (ref 3.80–5.20)
RDW: 13.8 % (ref 11.5–14.5)
WBC: 5.6 10*3/uL (ref 3.6–11.0)

## 2016-12-30 LAB — BASIC METABOLIC PANEL
ANION GAP: 8 (ref 5–15)
BUN: 34 mg/dL — ABNORMAL HIGH (ref 6–20)
CALCIUM: 9.5 mg/dL (ref 8.9–10.3)
CO2: 23 mmol/L (ref 22–32)
CREATININE: 1.69 mg/dL — AB (ref 0.44–1.00)
Chloride: 104 mmol/L (ref 101–111)
GFR, EST AFRICAN AMERICAN: 39 mL/min — AB (ref >60)
GFR, EST NON AFRICAN AMERICAN: 34 mL/min — AB (ref >60)
Glucose, Bld: 126 mg/dL — ABNORMAL HIGH (ref 65–99)
Potassium: 4.5 mmol/L (ref 3.5–5.1)
SODIUM: 135 mmol/L (ref 135–145)

## 2016-12-30 LAB — PHOSPHORUS: PHOSPHORUS: 4.1 mg/dL (ref 2.5–4.6)

## 2016-12-30 LAB — MAGNESIUM: MAGNESIUM: 1.5 mg/dL — AB (ref 1.7–2.4)

## 2016-12-31 LAB — TACROLIMUS LEVEL: Tacrolimus (FK506) - LabCorp: 8.9 ng/mL (ref 2.0–20.0)

## 2017-01-27 ENCOUNTER — Encounter
Admission: RE | Admit: 2017-01-27 | Discharge: 2017-01-27 | Disposition: A | Discharge status: Home / Self Care | Payer: BLUE CROSS/BLUE SHIELD | Source: Ambulatory Visit | Attending: Nephrology | Admitting: Nephrology

## 2017-01-27 DIAGNOSIS — Z114 Encounter for screening for human immunodeficiency virus [HIV]: Secondary | ICD-10-CM | POA: Diagnosis not present

## 2017-01-27 DIAGNOSIS — T8610 Unspecified complication of kidney transplant: Secondary | ICD-10-CM | POA: Diagnosis not present

## 2017-01-27 DIAGNOSIS — Z79899 Other long term (current) drug therapy: Secondary | ICD-10-CM | POA: Diagnosis not present

## 2017-01-27 DIAGNOSIS — Z09 Encounter for follow-up examination after completed treatment for conditions other than malignant neoplasm: Secondary | ICD-10-CM | POA: Diagnosis not present

## 2017-01-27 DIAGNOSIS — N189 Chronic kidney disease, unspecified: Secondary | ICD-10-CM | POA: Insufficient documentation

## 2017-01-27 DIAGNOSIS — Z789 Other specified health status: Secondary | ICD-10-CM | POA: Insufficient documentation

## 2017-01-27 DIAGNOSIS — E1129 Type 2 diabetes mellitus with other diabetic kidney complication: Secondary | ICD-10-CM | POA: Insufficient documentation

## 2017-01-27 DIAGNOSIS — D890 Polyclonal hypergammaglobulinemia: Secondary | ICD-10-CM | POA: Insufficient documentation

## 2017-01-27 DIAGNOSIS — D631 Anemia in chronic kidney disease: Secondary | ICD-10-CM | POA: Diagnosis not present

## 2017-01-27 DIAGNOSIS — Z940 Kidney transplant status: Secondary | ICD-10-CM | POA: Diagnosis not present

## 2017-01-27 DIAGNOSIS — Z9483 Pancreas transplant status: Secondary | ICD-10-CM | POA: Diagnosis not present

## 2017-01-27 DIAGNOSIS — N390 Urinary tract infection, site not specified: Secondary | ICD-10-CM | POA: Diagnosis not present

## 2017-01-27 DIAGNOSIS — B259 Cytomegaloviral disease, unspecified: Secondary | ICD-10-CM | POA: Diagnosis not present

## 2017-01-27 LAB — CBC WITH DIFFERENTIAL/PLATELET
BASOS ABS: 0 10*3/uL (ref 0–0.1)
BASOS PCT: 0 %
Eosinophils Absolute: 0.2 10*3/uL (ref 0–0.7)
Eosinophils Relative: 4 %
HCT: 32.6 % — ABNORMAL LOW (ref 35.0–47.0)
HEMOGLOBIN: 10.7 g/dL — AB (ref 12.0–16.0)
LYMPHS PCT: 24 %
Lymphs Abs: 1.4 10*3/uL (ref 1.0–3.6)
MCH: 26.9 pg (ref 26.0–34.0)
MCHC: 32.7 g/dL (ref 32.0–36.0)
MCV: 82.5 fL (ref 80.0–100.0)
MONO ABS: 0.5 10*3/uL (ref 0.2–0.9)
MONOS PCT: 8 %
NEUTROS ABS: 3.8 10*3/uL (ref 1.4–6.5)
NEUTROS PCT: 64 %
Platelets: 181 10*3/uL (ref 150–440)
RBC: 3.95 MIL/uL (ref 3.80–5.20)
RDW: 14.1 % (ref 11.5–14.5)
WBC: 6 10*3/uL (ref 3.6–11.0)

## 2017-01-27 LAB — ALKALINE PHOSPHATASE: ALK PHOS: 100 U/L (ref 38–126)

## 2017-01-27 LAB — GAMMA GT: GGT: 26 U/L (ref 7–50)

## 2017-01-27 LAB — BASIC METABOLIC PANEL
Anion gap: 7 (ref 5–15)
BUN: 34 mg/dL — AB (ref 6–20)
CHLORIDE: 104 mmol/L (ref 101–111)
CO2: 24 mmol/L (ref 22–32)
CREATININE: 1.49 mg/dL — AB (ref 0.44–1.00)
Calcium: 9.4 mg/dL (ref 8.9–10.3)
GFR calc Af Amer: 46 mL/min — ABNORMAL LOW (ref >60)
GFR calc non Af Amer: 40 mL/min — ABNORMAL LOW (ref >60)
Glucose, Bld: 134 mg/dL — ABNORMAL HIGH (ref 65–99)
POTASSIUM: 4.5 mmol/L (ref 3.5–5.1)
Sodium: 135 mmol/L (ref 135–145)

## 2017-01-27 LAB — HDL CHOLESTEROL: HDL: 55 mg/dL (ref >40)

## 2017-01-27 LAB — ALBUMIN: Albumin: 4.1 g/dL (ref 3.5–5.0)

## 2017-01-27 LAB — AST: AST: 27 U/L (ref 15–41)

## 2017-01-27 LAB — BILIRUBIN, TOTAL: Total Bilirubin: 1 mg/dL (ref 0.3–1.2)

## 2017-01-27 LAB — TRIGLYCERIDES: Triglycerides: 70 mg/dL (ref <150)

## 2017-01-27 LAB — PHOSPHORUS: PHOSPHORUS: 4.1 mg/dL (ref 2.5–4.6)

## 2017-01-27 LAB — ALT: ALT: 29 U/L (ref 14–54)

## 2017-01-27 LAB — MAGNESIUM: Magnesium: 1.5 mg/dL — ABNORMAL LOW (ref 1.7–2.4)

## 2017-01-27 LAB — CHOLESTEROL, TOTAL: CHOLESTEROL: 135 mg/dL (ref 0–200)

## 2017-01-28 LAB — LDL CHOLESTEROL, DIRECT: Direct LDL: 69 mg/dL (ref 0–99)

## 2017-01-29 LAB — TACROLIMUS LEVEL: TACROLIMUS (FK506) - LABCORP: 4.3 ng/mL (ref 2.0–20.0)

## 2017-02-17 ENCOUNTER — Other Ambulatory Visit
Admission: RE | Admit: 2017-02-17 | Discharge: 2017-02-17 | Disposition: A | Discharge status: Home / Self Care | Payer: BLUE CROSS/BLUE SHIELD | Source: Ambulatory Visit | Attending: Nephrology | Admitting: Nephrology

## 2017-02-17 DIAGNOSIS — Z79899 Other long term (current) drug therapy: Secondary | ICD-10-CM | POA: Insufficient documentation

## 2017-02-17 DIAGNOSIS — Z789 Other specified health status: Secondary | ICD-10-CM | POA: Diagnosis not present

## 2017-02-17 DIAGNOSIS — E559 Vitamin D deficiency, unspecified: Secondary | ICD-10-CM | POA: Insufficient documentation

## 2017-02-17 DIAGNOSIS — N390 Urinary tract infection, site not specified: Secondary | ICD-10-CM | POA: Diagnosis not present

## 2017-02-17 DIAGNOSIS — Z9483 Pancreas transplant status: Secondary | ICD-10-CM | POA: Diagnosis not present

## 2017-02-17 DIAGNOSIS — Z940 Kidney transplant status: Secondary | ICD-10-CM | POA: Diagnosis not present

## 2017-02-17 DIAGNOSIS — E1129 Type 2 diabetes mellitus with other diabetic kidney complication: Secondary | ICD-10-CM | POA: Diagnosis not present

## 2017-02-17 DIAGNOSIS — D899 Disorder involving the immune mechanism, unspecified: Secondary | ICD-10-CM | POA: Diagnosis not present

## 2017-02-17 DIAGNOSIS — N189 Chronic kidney disease, unspecified: Secondary | ICD-10-CM | POA: Insufficient documentation

## 2017-02-17 DIAGNOSIS — Z114 Encounter for screening for human immunodeficiency virus [HIV]: Secondary | ICD-10-CM | POA: Diagnosis not present

## 2017-02-17 DIAGNOSIS — T8610 Unspecified complication of kidney transplant: Secondary | ICD-10-CM | POA: Insufficient documentation

## 2017-02-17 DIAGNOSIS — D631 Anemia in chronic kidney disease: Secondary | ICD-10-CM | POA: Diagnosis not present

## 2017-02-17 DIAGNOSIS — B259 Cytomegaloviral disease, unspecified: Secondary | ICD-10-CM | POA: Diagnosis not present

## 2017-02-17 DIAGNOSIS — X58XXXA Exposure to other specified factors, initial encounter: Secondary | ICD-10-CM | POA: Insufficient documentation

## 2017-02-17 LAB — CBC WITH DIFFERENTIAL/PLATELET
BASOS ABS: 0.1 10*3/uL (ref 0–0.1)
BASOS PCT: 1 %
EOS PCT: 3 %
Eosinophils Absolute: 0.2 10*3/uL (ref 0–0.7)
HCT: 35.1 % (ref 35.0–47.0)
Hemoglobin: 11.4 g/dL — ABNORMAL LOW (ref 12.0–16.0)
Lymphocytes Relative: 27 %
Lymphs Abs: 1.5 10*3/uL (ref 1.0–3.6)
MCH: 26.7 pg (ref 26.0–34.0)
MCHC: 32.4 g/dL (ref 32.0–36.0)
MCV: 82.2 fL (ref 80.0–100.0)
MONO ABS: 0.5 10*3/uL (ref 0.2–0.9)
MONOS PCT: 8 %
NEUTROS PCT: 61 %
Neutro Abs: 3.6 10*3/uL (ref 1.4–6.5)
PLATELETS: 175 10*3/uL (ref 150–440)
RBC: 4.26 MIL/uL (ref 3.80–5.20)
RDW: 14.9 % — AB (ref 11.5–14.5)
WBC: 5.8 10*3/uL (ref 3.6–11.0)

## 2017-02-17 LAB — BASIC METABOLIC PANEL
Anion gap: 6 (ref 5–15)
BUN: 33 mg/dL — ABNORMAL HIGH (ref 6–20)
CALCIUM: 9.1 mg/dL (ref 8.9–10.3)
CO2: 25 mmol/L (ref 22–32)
Chloride: 105 mmol/L (ref 101–111)
Creatinine, Ser: 1.57 mg/dL — ABNORMAL HIGH (ref 0.44–1.00)
GFR, EST AFRICAN AMERICAN: 43 mL/min — AB (ref >60)
GFR, EST NON AFRICAN AMERICAN: 37 mL/min — AB (ref >60)
Glucose, Bld: 117 mg/dL — ABNORMAL HIGH (ref 65–99)
POTASSIUM: 4.1 mmol/L (ref 3.5–5.1)
Sodium: 136 mmol/L (ref 135–145)

## 2017-02-17 LAB — MAGNESIUM: MAGNESIUM: 1.4 mg/dL — AB (ref 1.7–2.4)

## 2017-02-17 LAB — PHOSPHORUS: Phosphorus: 3.4 mg/dL (ref 2.5–4.6)

## 2017-02-19 LAB — TACROLIMUS LEVEL: Tacrolimus (FK506) - LabCorp: 9 ng/mL (ref 2.0–20.0)

## 2017-03-10 ENCOUNTER — Other Ambulatory Visit
Admission: RE | Admit: 2017-03-10 | Discharge: 2017-03-10 | Disposition: A | Discharge status: Home / Self Care | Payer: BLUE CROSS/BLUE SHIELD | Source: Ambulatory Visit | Attending: Nephrology | Admitting: Nephrology

## 2017-03-10 DIAGNOSIS — B259 Cytomegaloviral disease, unspecified: Secondary | ICD-10-CM | POA: Insufficient documentation

## 2017-03-10 DIAGNOSIS — Z940 Kidney transplant status: Secondary | ICD-10-CM | POA: Insufficient documentation

## 2017-03-10 DIAGNOSIS — D899 Disorder involving the immune mechanism, unspecified: Secondary | ICD-10-CM | POA: Diagnosis not present

## 2017-03-10 DIAGNOSIS — Z9483 Pancreas transplant status: Secondary | ICD-10-CM | POA: Insufficient documentation

## 2017-03-10 DIAGNOSIS — E1129 Type 2 diabetes mellitus with other diabetic kidney complication: Secondary | ICD-10-CM | POA: Diagnosis not present

## 2017-03-10 DIAGNOSIS — D631 Anemia in chronic kidney disease: Secondary | ICD-10-CM | POA: Insufficient documentation

## 2017-03-10 DIAGNOSIS — N189 Chronic kidney disease, unspecified: Secondary | ICD-10-CM | POA: Insufficient documentation

## 2017-03-10 DIAGNOSIS — Z79899 Other long term (current) drug therapy: Secondary | ICD-10-CM | POA: Diagnosis not present

## 2017-03-10 DIAGNOSIS — E559 Vitamin D deficiency, unspecified: Secondary | ICD-10-CM | POA: Insufficient documentation

## 2017-03-10 LAB — CBC WITH DIFFERENTIAL/PLATELET
BASOS ABS: 0 10*3/uL (ref 0–0.1)
Basophils Relative: 1 %
Eosinophils Absolute: 0.2 10*3/uL (ref 0–0.7)
Eosinophils Relative: 3 %
HEMATOCRIT: 35 % (ref 35.0–47.0)
HEMOGLOBIN: 11.4 g/dL — AB (ref 12.0–16.0)
LYMPHS PCT: 27 %
Lymphs Abs: 1.3 10*3/uL (ref 1.0–3.6)
MCH: 26.6 pg (ref 26.0–34.0)
MCHC: 32.4 g/dL (ref 32.0–36.0)
MCV: 82.1 fL (ref 80.0–100.0)
MONOS PCT: 8 %
Monocytes Absolute: 0.4 10*3/uL (ref 0.2–0.9)
NEUTROS ABS: 2.9 10*3/uL (ref 1.4–6.5)
NEUTROS PCT: 61 %
Platelets: 169 10*3/uL (ref 150–440)
RBC: 4.27 MIL/uL (ref 3.80–5.20)
RDW: 14.8 % — ABNORMAL HIGH (ref 11.5–14.5)
WBC: 4.6 10*3/uL (ref 3.6–11.0)

## 2017-03-10 LAB — BASIC METABOLIC PANEL
Anion gap: 6 (ref 5–15)
BUN: 45 mg/dL — AB (ref 6–20)
CHLORIDE: 101 mmol/L (ref 101–111)
CO2: 27 mmol/L (ref 22–32)
Calcium: 9 mg/dL (ref 8.9–10.3)
Creatinine, Ser: 1.61 mg/dL — ABNORMAL HIGH (ref 0.44–1.00)
GFR calc Af Amer: 42 mL/min — ABNORMAL LOW (ref >60)
GFR calc non Af Amer: 36 mL/min — ABNORMAL LOW (ref >60)
GLUCOSE: 81 mg/dL (ref 65–99)
POTASSIUM: 4.1 mmol/L (ref 3.5–5.1)
Sodium: 134 mmol/L — ABNORMAL LOW (ref 135–145)

## 2017-03-10 LAB — PHOSPHORUS: Phosphorus: 3.7 mg/dL (ref 2.5–4.6)

## 2017-03-10 LAB — MAGNESIUM: MAGNESIUM: 1.7 mg/dL (ref 1.7–2.4)

## 2017-03-11 LAB — TACROLIMUS LEVEL: TACROLIMUS (FK506) - LABCORP: 4.1 ng/mL (ref 2.0–20.0)

## 2017-04-21 ENCOUNTER — Other Ambulatory Visit
Admission: RE | Admit: 2017-04-21 | Discharge: 2017-04-21 | Disposition: A | Discharge status: Home / Self Care | Payer: BLUE CROSS/BLUE SHIELD | Source: Ambulatory Visit | Attending: Nephrology | Admitting: Nephrology

## 2017-04-21 DIAGNOSIS — D631 Anemia in chronic kidney disease: Secondary | ICD-10-CM | POA: Insufficient documentation

## 2017-04-21 DIAGNOSIS — Z114 Encounter for screening for human immunodeficiency virus [HIV]: Secondary | ICD-10-CM | POA: Insufficient documentation

## 2017-04-21 DIAGNOSIS — Z09 Encounter for follow-up examination after completed treatment for conditions other than malignant neoplasm: Secondary | ICD-10-CM | POA: Insufficient documentation

## 2017-04-21 DIAGNOSIS — E559 Vitamin D deficiency, unspecified: Secondary | ICD-10-CM | POA: Insufficient documentation

## 2017-04-21 DIAGNOSIS — Z789 Other specified health status: Secondary | ICD-10-CM | POA: Insufficient documentation

## 2017-04-21 DIAGNOSIS — E1129 Type 2 diabetes mellitus with other diabetic kidney complication: Secondary | ICD-10-CM | POA: Diagnosis not present

## 2017-04-21 DIAGNOSIS — Z79899 Other long term (current) drug therapy: Secondary | ICD-10-CM | POA: Insufficient documentation

## 2017-04-21 DIAGNOSIS — B259 Cytomegaloviral disease, unspecified: Secondary | ICD-10-CM | POA: Insufficient documentation

## 2017-04-21 DIAGNOSIS — Z9489 Other transplanted organ and tissue status: Secondary | ICD-10-CM | POA: Diagnosis not present

## 2017-04-21 DIAGNOSIS — D899 Disorder involving the immune mechanism, unspecified: Secondary | ICD-10-CM | POA: Diagnosis present

## 2017-04-21 DIAGNOSIS — N390 Urinary tract infection, site not specified: Secondary | ICD-10-CM | POA: Diagnosis not present

## 2017-04-21 DIAGNOSIS — Z940 Kidney transplant status: Secondary | ICD-10-CM | POA: Diagnosis not present

## 2017-04-21 LAB — CBC WITH DIFFERENTIAL/PLATELET
Basophils Absolute: 0 K/uL (ref 0–0.1)
Basophils Relative: 1 %
Eosinophils Absolute: 0.2 K/uL (ref 0–0.7)
Eosinophils Relative: 5 %
HCT: 33.8 % — ABNORMAL LOW (ref 35.0–47.0)
Hemoglobin: 11 g/dL — ABNORMAL LOW (ref 12.0–16.0)
Lymphocytes Relative: 33 %
Lymphs Abs: 1.6 K/uL (ref 1.0–3.6)
MCH: 27 pg (ref 26.0–34.0)
MCHC: 32.6 g/dL (ref 32.0–36.0)
MCV: 82.9 fL (ref 80.0–100.0)
Monocytes Absolute: 0.3 K/uL (ref 0.2–0.9)
Monocytes Relative: 7 %
Neutro Abs: 2.5 K/uL (ref 1.4–6.5)
Neutrophils Relative %: 54 %
Platelets: 171 K/uL (ref 150–440)
RBC: 4.07 MIL/uL (ref 3.80–5.20)
RDW: 15 % — ABNORMAL HIGH (ref 11.5–14.5)
WBC: 4.6 K/uL (ref 3.6–11.0)

## 2017-04-21 LAB — BASIC METABOLIC PANEL
ANION GAP: 7 (ref 5–15)
BUN: 35 mg/dL — ABNORMAL HIGH (ref 6–20)
CALCIUM: 9.3 mg/dL (ref 8.9–10.3)
CO2: 25 mmol/L (ref 22–32)
CREATININE: 1.66 mg/dL — AB (ref 0.44–1.00)
Chloride: 103 mmol/L (ref 101–111)
GFR, EST AFRICAN AMERICAN: 40 mL/min — AB (ref >60)
GFR, EST NON AFRICAN AMERICAN: 35 mL/min — AB (ref >60)
Glucose, Bld: 103 mg/dL — ABNORMAL HIGH (ref 65–99)
Potassium: 4.6 mmol/L (ref 3.5–5.1)
SODIUM: 135 mmol/L (ref 135–145)

## 2017-04-21 LAB — PHOSPHORUS: Phosphorus: 3.5 mg/dL (ref 2.5–4.6)

## 2017-04-21 LAB — MAGNESIUM: MAGNESIUM: 1.7 mg/dL (ref 1.7–2.4)

## 2017-04-23 LAB — TACROLIMUS LEVEL: Tacrolimus (FK506) - LabCorp: 6.4 ng/mL (ref 2.0–20.0)

## 2017-05-19 ENCOUNTER — Other Ambulatory Visit
Admission: RE | Admit: 2017-05-19 | Discharge: 2017-05-19 | Disposition: A | Discharge status: Home / Self Care | Payer: BLUE CROSS/BLUE SHIELD | Source: Ambulatory Visit | Attending: Nephrology | Admitting: Nephrology

## 2017-05-19 DIAGNOSIS — Z789 Other specified health status: Secondary | ICD-10-CM | POA: Diagnosis not present

## 2017-05-19 DIAGNOSIS — B259 Cytomegaloviral disease, unspecified: Secondary | ICD-10-CM | POA: Insufficient documentation

## 2017-05-19 DIAGNOSIS — D631 Anemia in chronic kidney disease: Secondary | ICD-10-CM | POA: Insufficient documentation

## 2017-05-19 DIAGNOSIS — E1129 Type 2 diabetes mellitus with other diabetic kidney complication: Secondary | ICD-10-CM | POA: Insufficient documentation

## 2017-05-19 DIAGNOSIS — E559 Vitamin D deficiency, unspecified: Secondary | ICD-10-CM | POA: Diagnosis not present

## 2017-05-19 DIAGNOSIS — Z9483 Pancreas transplant status: Secondary | ICD-10-CM | POA: Insufficient documentation

## 2017-05-19 DIAGNOSIS — Z09 Encounter for follow-up examination after completed treatment for conditions other than malignant neoplasm: Secondary | ICD-10-CM | POA: Diagnosis not present

## 2017-05-19 DIAGNOSIS — Z940 Kidney transplant status: Secondary | ICD-10-CM | POA: Diagnosis present

## 2017-05-19 DIAGNOSIS — N390 Urinary tract infection, site not specified: Secondary | ICD-10-CM | POA: Diagnosis not present

## 2017-05-19 DIAGNOSIS — D899 Disorder involving the immune mechanism, unspecified: Secondary | ICD-10-CM | POA: Diagnosis present

## 2017-05-19 DIAGNOSIS — Z114 Encounter for screening for human immunodeficiency virus [HIV]: Secondary | ICD-10-CM | POA: Diagnosis not present

## 2017-05-19 DIAGNOSIS — Z79899 Other long term (current) drug therapy: Secondary | ICD-10-CM | POA: Diagnosis present

## 2017-05-19 LAB — BASIC METABOLIC PANEL
Anion gap: 9 (ref 5–15)
BUN: 41 mg/dL — ABNORMAL HIGH (ref 6–20)
CALCIUM: 9.3 mg/dL (ref 8.9–10.3)
CHLORIDE: 103 mmol/L (ref 101–111)
CO2: 23 mmol/L (ref 22–32)
CREATININE: 1.86 mg/dL — AB (ref 0.44–1.00)
GFR, EST AFRICAN AMERICAN: 35 mL/min — AB (ref >60)
GFR, EST NON AFRICAN AMERICAN: 30 mL/min — AB (ref >60)
Glucose, Bld: 72 mg/dL (ref 65–99)
Potassium: 4.7 mmol/L (ref 3.5–5.1)
SODIUM: 135 mmol/L (ref 135–145)

## 2017-05-19 LAB — CBC WITH DIFFERENTIAL/PLATELET
Basophils Absolute: 0 10*3/uL (ref 0–0.1)
Basophils Relative: 1 %
EOS ABS: 0.2 10*3/uL (ref 0–0.7)
EOS PCT: 3 %
HCT: 34.4 % — ABNORMAL LOW (ref 35.0–47.0)
HEMOGLOBIN: 11.2 g/dL — AB (ref 12.0–16.0)
Lymphocytes Relative: 37 %
Lymphs Abs: 1.9 10*3/uL (ref 1.0–3.6)
MCH: 26.7 pg (ref 26.0–34.0)
MCHC: 32.6 g/dL (ref 32.0–36.0)
MCV: 81.9 fL (ref 80.0–100.0)
Monocytes Absolute: 0.4 10*3/uL (ref 0.2–0.9)
Monocytes Relative: 7 %
NEUTROS PCT: 52 %
Neutro Abs: 2.6 10*3/uL (ref 1.4–6.5)
PLATELETS: 157 10*3/uL (ref 150–440)
RBC: 4.2 MIL/uL (ref 3.80–5.20)
RDW: 14.9 % — ABNORMAL HIGH (ref 11.5–14.5)
WBC: 5 10*3/uL (ref 3.6–11.0)

## 2017-05-19 LAB — MAGNESIUM: MAGNESIUM: 1.6 mg/dL — AB (ref 1.7–2.4)

## 2017-05-19 LAB — PHOSPHORUS: PHOSPHORUS: 4.4 mg/dL (ref 2.5–4.6)

## 2017-05-22 LAB — TACROLIMUS LEVEL: Tacrolimus (FK506) - LabCorp: 5.2 ng/mL (ref 2.0–20.0)

## 2017-06-16 ENCOUNTER — Other Ambulatory Visit
Admission: RE | Admit: 2017-06-16 | Discharge: 2017-06-16 | Disposition: A | Discharge status: Home / Self Care | Payer: BLUE CROSS/BLUE SHIELD | Source: Ambulatory Visit | Attending: Nephrology | Admitting: Nephrology

## 2017-06-16 DIAGNOSIS — Z114 Encounter for screening for human immunodeficiency virus [HIV]: Secondary | ICD-10-CM | POA: Diagnosis not present

## 2017-06-16 DIAGNOSIS — B259 Cytomegaloviral disease, unspecified: Secondary | ICD-10-CM | POA: Insufficient documentation

## 2017-06-16 DIAGNOSIS — E559 Vitamin D deficiency, unspecified: Secondary | ICD-10-CM | POA: Insufficient documentation

## 2017-06-16 DIAGNOSIS — E1129 Type 2 diabetes mellitus with other diabetic kidney complication: Secondary | ICD-10-CM | POA: Insufficient documentation

## 2017-06-16 DIAGNOSIS — Z789 Other specified health status: Secondary | ICD-10-CM | POA: Diagnosis not present

## 2017-06-16 DIAGNOSIS — Z940 Kidney transplant status: Secondary | ICD-10-CM | POA: Diagnosis not present

## 2017-06-16 DIAGNOSIS — Z79899 Other long term (current) drug therapy: Secondary | ICD-10-CM | POA: Insufficient documentation

## 2017-06-16 DIAGNOSIS — D899 Disorder involving the immune mechanism, unspecified: Secondary | ICD-10-CM | POA: Diagnosis not present

## 2017-06-16 DIAGNOSIS — Z9483 Pancreas transplant status: Secondary | ICD-10-CM | POA: Diagnosis not present

## 2017-06-16 DIAGNOSIS — T8610 Unspecified complication of kidney transplant: Secondary | ICD-10-CM | POA: Insufficient documentation

## 2017-06-16 DIAGNOSIS — Z09 Encounter for follow-up examination after completed treatment for conditions other than malignant neoplasm: Secondary | ICD-10-CM | POA: Diagnosis not present

## 2017-06-16 DIAGNOSIS — D631 Anemia in chronic kidney disease: Secondary | ICD-10-CM | POA: Diagnosis not present

## 2017-06-16 DIAGNOSIS — N390 Urinary tract infection, site not specified: Secondary | ICD-10-CM | POA: Insufficient documentation

## 2017-06-16 LAB — CBC WITH DIFFERENTIAL/PLATELET
BASOS PCT: 0 %
Basophils Absolute: 0 10*3/uL (ref 0–0.1)
Eosinophils Absolute: 0.2 10*3/uL (ref 0–0.7)
Eosinophils Relative: 4 %
HCT: 34 % — ABNORMAL LOW (ref 35.0–47.0)
HEMOGLOBIN: 11.1 g/dL — AB (ref 12.0–16.0)
LYMPHS ABS: 1.4 10*3/uL (ref 1.0–3.6)
LYMPHS PCT: 32 %
MCH: 27.2 pg (ref 26.0–34.0)
MCHC: 32.7 g/dL (ref 32.0–36.0)
MCV: 83.1 fL (ref 80.0–100.0)
MONOS PCT: 8 %
Monocytes Absolute: 0.3 10*3/uL (ref 0.2–0.9)
NEUTROS ABS: 2.5 10*3/uL (ref 1.4–6.5)
NEUTROS PCT: 56 %
Platelets: 158 10*3/uL (ref 150–440)
RBC: 4.09 MIL/uL (ref 3.80–5.20)
RDW: 14.4 % (ref 11.5–14.5)
WBC: 4.4 10*3/uL (ref 3.6–11.0)

## 2017-06-16 LAB — BASIC METABOLIC PANEL
Anion gap: 8 (ref 5–15)
BUN: 36 mg/dL — AB (ref 6–20)
CHLORIDE: 102 mmol/L (ref 101–111)
CO2: 26 mmol/L (ref 22–32)
CREATININE: 1.52 mg/dL — AB (ref 0.44–1.00)
Calcium: 9.5 mg/dL (ref 8.9–10.3)
GFR calc Af Amer: 45 mL/min — ABNORMAL LOW (ref >60)
GFR calc non Af Amer: 39 mL/min — ABNORMAL LOW (ref >60)
GLUCOSE: 195 mg/dL — AB (ref 65–99)
POTASSIUM: 4.1 mmol/L (ref 3.5–5.1)
Sodium: 136 mmol/L (ref 135–145)

## 2017-06-16 LAB — MAGNESIUM: Magnesium: 1.5 mg/dL — ABNORMAL LOW (ref 1.7–2.4)

## 2017-06-16 LAB — PHOSPHORUS: PHOSPHORUS: 3.8 mg/dL (ref 2.5–4.6)

## 2017-06-19 LAB — TACROLIMUS LEVEL: TACROLIMUS (FK506) - LABCORP: 4.7 ng/mL (ref 2.0–20.0)

## 2017-07-14 ENCOUNTER — Other Ambulatory Visit
Admission: RE | Admit: 2017-07-14 | Discharge: 2017-07-14 | Disposition: A | Discharge status: Home / Self Care | Payer: BLUE CROSS/BLUE SHIELD | Source: Ambulatory Visit | Attending: Nephrology | Admitting: Nephrology

## 2017-07-14 DIAGNOSIS — Z09 Encounter for follow-up examination after completed treatment for conditions other than malignant neoplasm: Secondary | ICD-10-CM | POA: Diagnosis not present

## 2017-07-14 DIAGNOSIS — Z940 Kidney transplant status: Secondary | ICD-10-CM | POA: Insufficient documentation

## 2017-07-14 DIAGNOSIS — N390 Urinary tract infection, site not specified: Secondary | ICD-10-CM | POA: Insufficient documentation

## 2017-07-14 DIAGNOSIS — E1129 Type 2 diabetes mellitus with other diabetic kidney complication: Secondary | ICD-10-CM | POA: Diagnosis not present

## 2017-07-14 DIAGNOSIS — Z79899 Other long term (current) drug therapy: Secondary | ICD-10-CM | POA: Insufficient documentation

## 2017-07-14 DIAGNOSIS — D899 Disorder involving the immune mechanism, unspecified: Secondary | ICD-10-CM | POA: Diagnosis present

## 2017-07-14 DIAGNOSIS — Z789 Other specified health status: Secondary | ICD-10-CM | POA: Insufficient documentation

## 2017-07-14 DIAGNOSIS — F631 Pyromania: Secondary | ICD-10-CM | POA: Insufficient documentation

## 2017-07-14 DIAGNOSIS — Z9483 Pancreas transplant status: Secondary | ICD-10-CM | POA: Diagnosis not present

## 2017-07-14 DIAGNOSIS — B259 Cytomegaloviral disease, unspecified: Secondary | ICD-10-CM | POA: Insufficient documentation

## 2017-07-14 DIAGNOSIS — E559 Vitamin D deficiency, unspecified: Secondary | ICD-10-CM | POA: Insufficient documentation

## 2017-07-14 DIAGNOSIS — Z114 Encounter for screening for human immunodeficiency virus [HIV]: Secondary | ICD-10-CM | POA: Diagnosis not present

## 2017-07-14 LAB — BASIC METABOLIC PANEL
Anion gap: 6 (ref 5–15)
BUN: 36 mg/dL — AB (ref 6–20)
CHLORIDE: 106 mmol/L (ref 101–111)
CO2: 26 mmol/L (ref 22–32)
CREATININE: 1.51 mg/dL — AB (ref 0.44–1.00)
Calcium: 9.3 mg/dL (ref 8.9–10.3)
GFR calc Af Amer: 45 mL/min — ABNORMAL LOW (ref >60)
GFR calc non Af Amer: 39 mL/min — ABNORMAL LOW (ref >60)
Glucose, Bld: 110 mg/dL — ABNORMAL HIGH (ref 65–99)
POTASSIUM: 4.4 mmol/L (ref 3.5–5.1)
Sodium: 138 mmol/L (ref 135–145)

## 2017-07-14 LAB — CBC WITH DIFFERENTIAL/PLATELET
Basophils Absolute: 0 10*3/uL (ref 0–0.1)
Basophils Relative: 1 %
EOS PCT: 5 %
Eosinophils Absolute: 0.2 10*3/uL (ref 0–0.7)
HEMATOCRIT: 34.5 % — AB (ref 35.0–47.0)
Hemoglobin: 11.2 g/dL — ABNORMAL LOW (ref 12.0–16.0)
LYMPHS ABS: 1.4 10*3/uL (ref 1.0–3.6)
LYMPHS PCT: 34 %
MCH: 27.1 pg (ref 26.0–34.0)
MCHC: 32.5 g/dL (ref 32.0–36.0)
MCV: 83.2 fL (ref 80.0–100.0)
MONO ABS: 0.3 10*3/uL (ref 0.2–0.9)
Monocytes Relative: 7 %
NEUTROS ABS: 2.2 10*3/uL (ref 1.4–6.5)
Neutrophils Relative %: 53 %
PLATELETS: 178 10*3/uL (ref 150–440)
RBC: 4.15 MIL/uL (ref 3.80–5.20)
RDW: 14.7 % — AB (ref 11.5–14.5)
WBC: 4.1 10*3/uL (ref 3.6–11.0)

## 2017-07-14 LAB — HDL CHOLESTEROL: HDL: 68 mg/dL (ref >40)

## 2017-07-14 LAB — PHOSPHORUS: PHOSPHORUS: 3.9 mg/dL (ref 2.5–4.6)

## 2017-07-14 LAB — TRIGLYCERIDES: Triglycerides: 63 mg/dL (ref <150)

## 2017-07-14 LAB — GAMMA GT: GGT: 32 U/L (ref 7–50)

## 2017-07-14 LAB — ALT: ALT: 22 U/L (ref 14–54)

## 2017-07-14 LAB — BILIRUBIN, TOTAL: BILIRUBIN TOTAL: 1.3 mg/dL — AB (ref 0.3–1.2)

## 2017-07-14 LAB — MAGNESIUM: Magnesium: 1.8 mg/dL (ref 1.7–2.4)

## 2017-07-14 LAB — AST: AST: 27 U/L (ref 15–41)

## 2017-07-14 LAB — ALBUMIN: Albumin: 4.5 g/dL (ref 3.5–5.0)

## 2017-07-14 LAB — CHOLESTEROL, TOTAL: CHOLESTEROL: 149 mg/dL (ref 0–200)

## 2017-07-15 LAB — LDL CHOLESTEROL, DIRECT: LDL DIRECT: 65 mg/dL (ref 0–99)

## 2017-07-16 LAB — TACROLIMUS LEVEL: TACROLIMUS (FK506) - LABCORP: 4.6 ng/mL (ref 2.0–20.0)

## 2017-08-25 ENCOUNTER — Other Ambulatory Visit
Admission: RE | Admit: 2017-08-25 | Discharge: 2017-08-25 | Disposition: A | Discharge status: Home / Self Care | Payer: BLUE CROSS/BLUE SHIELD | Source: Ambulatory Visit | Attending: Nephrology | Admitting: Nephrology

## 2017-08-25 DIAGNOSIS — D899 Disorder involving the immune mechanism, unspecified: Secondary | ICD-10-CM | POA: Insufficient documentation

## 2017-08-25 DIAGNOSIS — E559 Vitamin D deficiency, unspecified: Secondary | ICD-10-CM | POA: Insufficient documentation

## 2017-08-25 DIAGNOSIS — Z789 Other specified health status: Secondary | ICD-10-CM | POA: Insufficient documentation

## 2017-08-25 DIAGNOSIS — Z09 Encounter for follow-up examination after completed treatment for conditions other than malignant neoplasm: Secondary | ICD-10-CM | POA: Insufficient documentation

## 2017-08-25 DIAGNOSIS — B259 Cytomegaloviral disease, unspecified: Secondary | ICD-10-CM | POA: Insufficient documentation

## 2017-08-25 DIAGNOSIS — N390 Urinary tract infection, site not specified: Secondary | ICD-10-CM | POA: Diagnosis not present

## 2017-08-25 DIAGNOSIS — Z9483 Pancreas transplant status: Secondary | ICD-10-CM | POA: Diagnosis not present

## 2017-08-25 DIAGNOSIS — D631 Anemia in chronic kidney disease: Secondary | ICD-10-CM | POA: Diagnosis not present

## 2017-08-25 DIAGNOSIS — E1129 Type 2 diabetes mellitus with other diabetic kidney complication: Secondary | ICD-10-CM | POA: Insufficient documentation

## 2017-08-25 DIAGNOSIS — Z114 Encounter for screening for human immunodeficiency virus [HIV]: Secondary | ICD-10-CM | POA: Insufficient documentation

## 2017-08-25 DIAGNOSIS — Z940 Kidney transplant status: Secondary | ICD-10-CM | POA: Diagnosis not present

## 2017-08-25 DIAGNOSIS — Z79899 Other long term (current) drug therapy: Secondary | ICD-10-CM | POA: Diagnosis present

## 2017-08-25 LAB — CBC WITH DIFFERENTIAL/PLATELET
Basophils Absolute: 0 10*3/uL (ref 0–0.1)
Basophils Relative: 1 %
EOS PCT: 3 %
Eosinophils Absolute: 0.2 10*3/uL (ref 0–0.7)
HEMATOCRIT: 34.4 % — AB (ref 35.0–47.0)
Hemoglobin: 11.3 g/dL — ABNORMAL LOW (ref 12.0–16.0)
LYMPHS ABS: 1.7 10*3/uL (ref 1.0–3.6)
LYMPHS PCT: 38 %
MCH: 27.7 pg (ref 26.0–34.0)
MCHC: 32.9 g/dL (ref 32.0–36.0)
MCV: 84.1 fL (ref 80.0–100.0)
MONO ABS: 0.3 10*3/uL (ref 0.2–0.9)
MONOS PCT: 7 %
Neutro Abs: 2.3 10*3/uL (ref 1.4–6.5)
Neutrophils Relative %: 51 %
PLATELETS: 162 10*3/uL (ref 150–440)
RBC: 4.09 MIL/uL (ref 3.80–5.20)
RDW: 13.8 % (ref 11.5–14.5)
WBC: 4.5 10*3/uL (ref 3.6–11.0)

## 2017-08-25 LAB — PHOSPHORUS: PHOSPHORUS: 4.1 mg/dL (ref 2.5–4.6)

## 2017-08-25 LAB — BASIC METABOLIC PANEL
Anion gap: 6 (ref 5–15)
BUN: 38 mg/dL — AB (ref 6–20)
CALCIUM: 9.3 mg/dL (ref 8.9–10.3)
CO2: 26 mmol/L (ref 22–32)
CREATININE: 1.71 mg/dL — AB (ref 0.44–1.00)
Chloride: 104 mmol/L (ref 101–111)
GFR, EST AFRICAN AMERICAN: 39 mL/min — AB (ref >60)
GFR, EST NON AFRICAN AMERICAN: 34 mL/min — AB (ref >60)
Glucose, Bld: 54 mg/dL — ABNORMAL LOW (ref 65–99)
Potassium: 4.3 mmol/L (ref 3.5–5.1)
SODIUM: 136 mmol/L (ref 135–145)

## 2017-08-25 LAB — MAGNESIUM: MAGNESIUM: 1.7 mg/dL (ref 1.7–2.4)

## 2017-08-27 LAB — TACROLIMUS LEVEL: Tacrolimus (FK506) - LabCorp: 5.9 ng/mL (ref 2.0–20.0)

## 2017-10-06 ENCOUNTER — Other Ambulatory Visit
Admission: RE | Admit: 2017-10-06 | Discharge: 2017-10-06 | Disposition: A | Discharge status: Home / Self Care | Payer: BLUE CROSS/BLUE SHIELD | Source: Ambulatory Visit | Attending: Nephrology | Admitting: Nephrology

## 2017-10-06 DIAGNOSIS — E559 Vitamin D deficiency, unspecified: Secondary | ICD-10-CM | POA: Insufficient documentation

## 2017-10-06 DIAGNOSIS — N189 Chronic kidney disease, unspecified: Secondary | ICD-10-CM | POA: Diagnosis not present

## 2017-10-06 DIAGNOSIS — D259 Leiomyoma of uterus, unspecified: Secondary | ICD-10-CM | POA: Diagnosis not present

## 2017-10-06 DIAGNOSIS — Z9483 Pancreas transplant status: Secondary | ICD-10-CM | POA: Insufficient documentation

## 2017-10-06 DIAGNOSIS — Z79899 Other long term (current) drug therapy: Secondary | ICD-10-CM | POA: Diagnosis present

## 2017-10-06 DIAGNOSIS — D631 Anemia in chronic kidney disease: Secondary | ICD-10-CM | POA: Insufficient documentation

## 2017-10-06 DIAGNOSIS — T8610 Unspecified complication of kidney transplant: Secondary | ICD-10-CM | POA: Diagnosis not present

## 2017-10-06 DIAGNOSIS — Z794 Long term (current) use of insulin: Secondary | ICD-10-CM | POA: Diagnosis present

## 2017-10-06 DIAGNOSIS — Z09 Encounter for follow-up examination after completed treatment for conditions other than malignant neoplasm: Secondary | ICD-10-CM | POA: Insufficient documentation

## 2017-10-06 DIAGNOSIS — Z789 Other specified health status: Secondary | ICD-10-CM | POA: Insufficient documentation

## 2017-10-06 DIAGNOSIS — N390 Urinary tract infection, site not specified: Secondary | ICD-10-CM | POA: Insufficient documentation

## 2017-10-06 DIAGNOSIS — D899 Disorder involving the immune mechanism, unspecified: Secondary | ICD-10-CM | POA: Diagnosis present

## 2017-10-06 DIAGNOSIS — Z114 Encounter for screening for human immunodeficiency virus [HIV]: Secondary | ICD-10-CM | POA: Insufficient documentation

## 2017-10-06 DIAGNOSIS — E1129 Type 2 diabetes mellitus with other diabetic kidney complication: Secondary | ICD-10-CM | POA: Insufficient documentation

## 2017-10-06 LAB — CBC WITH DIFFERENTIAL/PLATELET
Basophils Absolute: 0 10*3/uL (ref 0–0.1)
Basophils Relative: 1 %
EOS ABS: 0.1 10*3/uL (ref 0–0.7)
Eosinophils Relative: 2 %
HCT: 35 % (ref 35.0–47.0)
HEMOGLOBIN: 11.2 g/dL — AB (ref 12.0–16.0)
Lymphocytes Relative: 30 %
Lymphs Abs: 1.4 10*3/uL (ref 1.0–3.6)
MCH: 26.7 pg (ref 26.0–34.0)
MCHC: 32 g/dL (ref 32.0–36.0)
MCV: 83.5 fL (ref 80.0–100.0)
MONO ABS: 0.4 10*3/uL (ref 0.2–0.9)
Monocytes Relative: 7 %
NEUTROS PCT: 60 %
Neutro Abs: 2.9 10*3/uL (ref 1.4–6.5)
PLATELETS: 171 10*3/uL (ref 150–440)
RBC: 4.19 MIL/uL (ref 3.80–5.20)
RDW: 14.2 % (ref 11.5–14.5)
WBC: 4.8 10*3/uL (ref 3.6–11.0)

## 2017-10-06 LAB — BASIC METABOLIC PANEL
ANION GAP: 9 (ref 5–15)
BUN: 38 mg/dL — ABNORMAL HIGH (ref 6–20)
CALCIUM: 9.4 mg/dL (ref 8.9–10.3)
CO2: 24 mmol/L (ref 22–32)
Chloride: 106 mmol/L (ref 101–111)
Creatinine, Ser: 1.73 mg/dL — ABNORMAL HIGH (ref 0.44–1.00)
GFR, EST AFRICAN AMERICAN: 38 mL/min — AB (ref >60)
GFR, EST NON AFRICAN AMERICAN: 33 mL/min — AB (ref >60)
GLUCOSE: 63 mg/dL — AB (ref 65–99)
POTASSIUM: 4.3 mmol/L (ref 3.5–5.1)
Sodium: 139 mmol/L (ref 135–145)

## 2017-10-06 LAB — HEPATIC FUNCTION PANEL
ALK PHOS: 99 U/L (ref 38–126)
ALT: 27 U/L (ref 14–54)
AST: 27 U/L (ref 15–41)
Albumin: 4.5 g/dL (ref 3.5–5.0)
BILIRUBIN DIRECT: 0.1 mg/dL (ref 0.1–0.5)
BILIRUBIN INDIRECT: 0.9 mg/dL (ref 0.3–0.9)
Total Bilirubin: 1 mg/dL (ref 0.3–1.2)
Total Protein: 8 g/dL (ref 6.5–8.1)

## 2017-10-06 LAB — LIPID PANEL
Cholesterol: 137 mg/dL (ref 0–200)
HDL: 75 mg/dL (ref >40)
LDL CALC: 54 mg/dL (ref 0–99)
Total CHOL/HDL Ratio: 1.8 RATIO
Triglycerides: 39 mg/dL (ref <150)
VLDL: 8 mg/dL (ref 0–40)

## 2017-10-06 LAB — MAGNESIUM: MAGNESIUM: 1.6 mg/dL — AB (ref 1.7–2.4)

## 2017-10-06 LAB — PHOSPHORUS: Phosphorus: 4.2 mg/dL (ref 2.5–4.6)

## 2017-10-07 LAB — GAMMA GT: GGT: 22 U/L (ref 7–50)

## 2017-10-09 LAB — TACROLIMUS LEVEL

## 2017-12-01 ENCOUNTER — Other Ambulatory Visit
Admission: RE | Admit: 2017-12-01 | Discharge: 2017-12-01 | Disposition: A | Discharge status: Home / Self Care | Payer: BLUE CROSS/BLUE SHIELD | Source: Ambulatory Visit | Attending: Nephrology | Admitting: Nephrology

## 2017-12-01 DIAGNOSIS — Z09 Encounter for follow-up examination after completed treatment for conditions other than malignant neoplasm: Secondary | ICD-10-CM | POA: Diagnosis not present

## 2017-12-01 DIAGNOSIS — E1129 Type 2 diabetes mellitus with other diabetic kidney complication: Secondary | ICD-10-CM | POA: Insufficient documentation

## 2017-12-01 DIAGNOSIS — Z789 Other specified health status: Secondary | ICD-10-CM | POA: Diagnosis not present

## 2017-12-01 DIAGNOSIS — N189 Chronic kidney disease, unspecified: Secondary | ICD-10-CM | POA: Diagnosis not present

## 2017-12-01 DIAGNOSIS — D631 Anemia in chronic kidney disease: Secondary | ICD-10-CM | POA: Diagnosis not present

## 2017-12-01 DIAGNOSIS — Z114 Encounter for screening for human immunodeficiency virus [HIV]: Secondary | ICD-10-CM | POA: Insufficient documentation

## 2017-12-01 DIAGNOSIS — Z9483 Pancreas transplant status: Secondary | ICD-10-CM | POA: Insufficient documentation

## 2017-12-01 DIAGNOSIS — Z79899 Other long term (current) drug therapy: Secondary | ICD-10-CM | POA: Diagnosis present

## 2017-12-01 DIAGNOSIS — Z940 Kidney transplant status: Secondary | ICD-10-CM | POA: Diagnosis present

## 2017-12-01 DIAGNOSIS — B259 Cytomegaloviral disease, unspecified: Secondary | ICD-10-CM | POA: Diagnosis not present

## 2017-12-01 DIAGNOSIS — D899 Disorder involving the immune mechanism, unspecified: Secondary | ICD-10-CM | POA: Diagnosis not present

## 2017-12-01 DIAGNOSIS — E559 Vitamin D deficiency, unspecified: Secondary | ICD-10-CM | POA: Insufficient documentation

## 2017-12-01 DIAGNOSIS — N390 Urinary tract infection, site not specified: Secondary | ICD-10-CM | POA: Diagnosis not present

## 2017-12-01 LAB — CBC WITH DIFFERENTIAL/PLATELET
Basophils Absolute: 0 10*3/uL (ref 0–0.1)
Basophils Relative: 1 %
EOS ABS: 0.2 10*3/uL (ref 0–0.7)
EOS PCT: 3 %
HCT: 34.5 % — ABNORMAL LOW (ref 35.0–47.0)
Hemoglobin: 10.9 g/dL — ABNORMAL LOW (ref 12.0–16.0)
LYMPHS ABS: 1.8 10*3/uL (ref 1.0–3.6)
Lymphocytes Relative: 39 %
MCH: 26.5 pg (ref 26.0–34.0)
MCHC: 31.7 g/dL — AB (ref 32.0–36.0)
MCV: 83.7 fL (ref 80.0–100.0)
MONO ABS: 0.4 10*3/uL (ref 0.2–0.9)
Monocytes Relative: 8 %
Neutro Abs: 2.3 10*3/uL (ref 1.4–6.5)
Neutrophils Relative %: 49 %
PLATELETS: 179 10*3/uL (ref 150–440)
RBC: 4.12 MIL/uL (ref 3.80–5.20)
RDW: 14.5 % (ref 11.5–14.5)
WBC: 4.7 10*3/uL (ref 3.6–11.0)

## 2017-12-01 LAB — MAGNESIUM: Magnesium: 1.7 mg/dL (ref 1.7–2.4)

## 2017-12-01 LAB — BASIC METABOLIC PANEL
ANION GAP: 9 (ref 5–15)
BUN: 36 mg/dL — ABNORMAL HIGH (ref 6–20)
CALCIUM: 9.6 mg/dL (ref 8.9–10.3)
CO2: 26 mmol/L (ref 22–32)
CREATININE: 1.64 mg/dL — AB (ref 0.44–1.00)
Chloride: 101 mmol/L (ref 101–111)
GFR, EST AFRICAN AMERICAN: 41 mL/min — AB (ref >60)
GFR, EST NON AFRICAN AMERICAN: 35 mL/min — AB (ref >60)
Glucose, Bld: 126 mg/dL — ABNORMAL HIGH (ref 65–99)
Potassium: 4.2 mmol/L (ref 3.5–5.1)
Sodium: 136 mmol/L (ref 135–145)

## 2017-12-01 LAB — PHOSPHORUS: Phosphorus: 4.1 mg/dL (ref 2.5–4.6)

## 2017-12-03 LAB — TACROLIMUS LEVEL: Tacrolimus (FK506) - LabCorp: 5.9 ng/mL (ref 2.0–20.0)

## 2018-01-19 ENCOUNTER — Encounter
Admission: RE | Admit: 2018-01-19 | Discharge: 2018-01-19 | Disposition: A | Discharge status: Home / Self Care | Payer: BLUE CROSS/BLUE SHIELD | Source: Ambulatory Visit | Attending: Nephrology | Admitting: Nephrology

## 2018-01-19 DIAGNOSIS — Z9483 Pancreas transplant status: Secondary | ICD-10-CM | POA: Diagnosis not present

## 2018-01-19 DIAGNOSIS — Z79899 Other long term (current) drug therapy: Secondary | ICD-10-CM | POA: Diagnosis present

## 2018-01-19 DIAGNOSIS — E559 Vitamin D deficiency, unspecified: Secondary | ICD-10-CM | POA: Insufficient documentation

## 2018-01-19 DIAGNOSIS — B259 Cytomegaloviral disease, unspecified: Secondary | ICD-10-CM | POA: Insufficient documentation

## 2018-01-19 DIAGNOSIS — E1129 Type 2 diabetes mellitus with other diabetic kidney complication: Secondary | ICD-10-CM | POA: Diagnosis not present

## 2018-01-19 DIAGNOSIS — N390 Urinary tract infection, site not specified: Secondary | ICD-10-CM | POA: Insufficient documentation

## 2018-01-19 DIAGNOSIS — D899 Disorder involving the immune mechanism, unspecified: Secondary | ICD-10-CM | POA: Insufficient documentation

## 2018-01-19 DIAGNOSIS — Z114 Encounter for screening for human immunodeficiency virus [HIV]: Secondary | ICD-10-CM | POA: Insufficient documentation

## 2018-01-19 DIAGNOSIS — Z789 Other specified health status: Secondary | ICD-10-CM | POA: Diagnosis not present

## 2018-01-19 DIAGNOSIS — Z09 Encounter for follow-up examination after completed treatment for conditions other than malignant neoplasm: Secondary | ICD-10-CM | POA: Diagnosis present

## 2018-01-19 DIAGNOSIS — D631 Anemia in chronic kidney disease: Secondary | ICD-10-CM | POA: Insufficient documentation

## 2018-01-19 DIAGNOSIS — Z940 Kidney transplant status: Secondary | ICD-10-CM | POA: Diagnosis present

## 2018-01-19 LAB — CBC WITH DIFFERENTIAL/PLATELET
Basophils Absolute: 0 10*3/uL (ref 0–0.1)
Basophils Relative: 0 %
Eosinophils Absolute: 0.1 10*3/uL (ref 0–0.7)
Eosinophils Relative: 2 %
HCT: 34.1 % — ABNORMAL LOW (ref 35.0–47.0)
Hemoglobin: 10.8 g/dL — ABNORMAL LOW (ref 12.0–16.0)
Lymphocytes Relative: 25 %
Lymphs Abs: 1.7 10*3/uL (ref 1.0–3.6)
MCH: 26.4 pg (ref 26.0–34.0)
MCHC: 31.6 g/dL — ABNORMAL LOW (ref 32.0–36.0)
MCV: 83.5 fL (ref 80.0–100.0)
Monocytes Absolute: 0.3 10*3/uL (ref 0.2–0.9)
Monocytes Relative: 5 %
Neutro Abs: 4.5 10*3/uL (ref 1.4–6.5)
Neutrophils Relative %: 68 %
Platelets: 343 10*3/uL (ref 150–440)
RBC: 4.09 MIL/uL (ref 3.80–5.20)
RDW: 14.2 % (ref 11.5–14.5)
WBC: 6.6 10*3/uL (ref 3.6–11.0)

## 2018-01-19 LAB — BASIC METABOLIC PANEL
Anion gap: 9 (ref 5–15)
BUN: 41 mg/dL — AB (ref 6–20)
CALCIUM: 9.6 mg/dL (ref 8.9–10.3)
CHLORIDE: 105 mmol/L (ref 101–111)
CO2: 26 mmol/L (ref 22–32)
CREATININE: 2.06 mg/dL — AB (ref 0.44–1.00)
GFR calc Af Amer: 31 mL/min — ABNORMAL LOW (ref >60)
GFR, EST NON AFRICAN AMERICAN: 27 mL/min — AB (ref >60)
Glucose, Bld: 191 mg/dL — ABNORMAL HIGH (ref 65–99)
POTASSIUM: 4.2 mmol/L (ref 3.5–5.1)
Sodium: 140 mmol/L (ref 135–145)

## 2018-01-19 LAB — ALBUMIN: Albumin: 3.7 g/dL (ref 3.5–5.0)

## 2018-01-19 LAB — LIPID PANEL
CHOL/HDL RATIO: 2.4 ratio
Cholesterol: 137 mg/dL (ref 0–200)
HDL: 56 mg/dL (ref >40)
LDL CALC: 69 mg/dL (ref 0–99)
Triglycerides: 61 mg/dL (ref <150)
VLDL: 12 mg/dL (ref 0–40)

## 2018-01-19 LAB — ALKALINE PHOSPHATASE: Alkaline Phosphatase: 97 U/L (ref 38–126)

## 2018-01-19 LAB — BILIRUBIN, TOTAL: Total Bilirubin: 0.7 mg/dL (ref 0.3–1.2)

## 2018-01-19 LAB — PHOSPHORUS: Phosphorus: 3.6 mg/dL (ref 2.5–4.6)

## 2018-01-19 LAB — AST: AST: 28 U/L (ref 15–41)

## 2018-01-19 LAB — GAMMA GT: GGT: 34 U/L (ref 7–50)

## 2018-01-19 LAB — ALT: ALT: 22 U/L (ref 14–54)

## 2018-01-19 LAB — MAGNESIUM: Magnesium: 1.6 mg/dL — ABNORMAL LOW (ref 1.7–2.4)

## 2018-01-22 LAB — TACROLIMUS LEVEL: TACROLIMUS (FK506) - LABCORP: 8.3 ng/mL (ref 2.0–20.0)

## 2018-01-26 ENCOUNTER — Encounter
Admission: RE | Admit: 2018-01-26 | Discharge: 2018-01-26 | Disposition: A | Discharge status: Home / Self Care | Payer: BLUE CROSS/BLUE SHIELD | Source: Ambulatory Visit | Attending: Nephrology | Admitting: Nephrology

## 2018-01-26 DIAGNOSIS — Z09 Encounter for follow-up examination after completed treatment for conditions other than malignant neoplasm: Secondary | ICD-10-CM | POA: Diagnosis not present

## 2018-01-26 LAB — LIPID PANEL
CHOL/HDL RATIO: 2.6 ratio
Cholesterol: 154 mg/dL (ref 0–200)
HDL: 60 mg/dL (ref >40)
LDL CALC: 82 mg/dL (ref 0–99)
TRIGLYCERIDES: 58 mg/dL (ref <150)
VLDL: 12 mg/dL (ref 0–40)

## 2018-01-26 LAB — CBC WITH DIFFERENTIAL/PLATELET
BASOS PCT: 0 %
Basophils Absolute: 0 10*3/uL (ref 0–0.1)
Eosinophils Absolute: 0.1 10*3/uL (ref 0–0.7)
Eosinophils Relative: 3 %
HEMATOCRIT: 32.1 % — AB (ref 35.0–47.0)
HEMOGLOBIN: 10.5 g/dL — AB (ref 12.0–16.0)
LYMPHS ABS: 1.7 10*3/uL (ref 1.0–3.6)
Lymphocytes Relative: 33 %
MCH: 27.2 pg (ref 26.0–34.0)
MCHC: 32.7 g/dL (ref 32.0–36.0)
MCV: 83.2 fL (ref 80.0–100.0)
MONO ABS: 0.4 10*3/uL (ref 0.2–0.9)
MONOS PCT: 7 %
NEUTROS ABS: 3 10*3/uL (ref 1.4–6.5)
NEUTROS PCT: 57 %
Platelets: 272 10*3/uL (ref 150–440)
RBC: 3.86 MIL/uL (ref 3.80–5.20)
RDW: 14.7 % — AB (ref 11.5–14.5)
WBC: 5.3 10*3/uL (ref 3.6–11.0)

## 2018-01-26 LAB — BASIC METABOLIC PANEL
Anion gap: 9 (ref 5–15)
BUN: 39 mg/dL — AB (ref 6–20)
CHLORIDE: 104 mmol/L (ref 101–111)
CO2: 25 mmol/L (ref 22–32)
CREATININE: 1.76 mg/dL — AB (ref 0.44–1.00)
Calcium: 9.6 mg/dL (ref 8.9–10.3)
GFR calc Af Amer: 37 mL/min — ABNORMAL LOW (ref >60)
GFR calc non Af Amer: 32 mL/min — ABNORMAL LOW (ref >60)
GLUCOSE: 134 mg/dL — AB (ref 65–99)
POTASSIUM: 4.6 mmol/L (ref 3.5–5.1)
SODIUM: 138 mmol/L (ref 135–145)

## 2018-01-26 LAB — HEPATIC FUNCTION PANEL
ALT: 20 U/L (ref 14–54)
AST: 24 U/L (ref 15–41)
Albumin: 4.2 g/dL (ref 3.5–5.0)
Alkaline Phosphatase: 100 U/L (ref 38–126)
BILIRUBIN INDIRECT: 0.7 mg/dL (ref 0.3–0.9)
Bilirubin, Direct: 0.1 mg/dL (ref 0.1–0.5)
Total Bilirubin: 0.8 mg/dL (ref 0.3–1.2)
Total Protein: 8.1 g/dL (ref 6.5–8.1)

## 2018-01-26 LAB — MAGNESIUM: Magnesium: 1.8 mg/dL (ref 1.7–2.4)

## 2018-01-26 LAB — PHOSPHORUS: PHOSPHORUS: 3.9 mg/dL (ref 2.5–4.6)

## 2018-01-28 LAB — TACROLIMUS LEVEL: Tacrolimus (FK506) - LabCorp: 6.5 ng/mL (ref 2.0–20.0)

## 2018-02-23 ENCOUNTER — Encounter
Admission: RE | Admit: 2018-02-23 | Discharge: 2018-02-23 | Disposition: A | Discharge status: Home / Self Care | Payer: BLUE CROSS/BLUE SHIELD | Source: Ambulatory Visit | Attending: Nephrology | Admitting: Nephrology

## 2018-02-23 DIAGNOSIS — E1129 Type 2 diabetes mellitus with other diabetic kidney complication: Secondary | ICD-10-CM | POA: Diagnosis not present

## 2018-02-23 DIAGNOSIS — N390 Urinary tract infection, site not specified: Secondary | ICD-10-CM | POA: Insufficient documentation

## 2018-02-23 DIAGNOSIS — D899 Disorder involving the immune mechanism, unspecified: Secondary | ICD-10-CM | POA: Insufficient documentation

## 2018-02-23 DIAGNOSIS — Z9483 Pancreas transplant status: Secondary | ICD-10-CM | POA: Diagnosis not present

## 2018-02-23 DIAGNOSIS — Z789 Other specified health status: Secondary | ICD-10-CM | POA: Insufficient documentation

## 2018-02-23 DIAGNOSIS — Z09 Encounter for follow-up examination after completed treatment for conditions other than malignant neoplasm: Secondary | ICD-10-CM | POA: Diagnosis not present

## 2018-02-23 DIAGNOSIS — D631 Anemia in chronic kidney disease: Secondary | ICD-10-CM | POA: Diagnosis not present

## 2018-02-23 DIAGNOSIS — Z940 Kidney transplant status: Secondary | ICD-10-CM | POA: Insufficient documentation

## 2018-02-23 DIAGNOSIS — Z114 Encounter for screening for human immunodeficiency virus [HIV]: Secondary | ICD-10-CM | POA: Diagnosis not present

## 2018-02-23 DIAGNOSIS — E559 Vitamin D deficiency, unspecified: Secondary | ICD-10-CM | POA: Insufficient documentation

## 2018-02-23 DIAGNOSIS — B259 Cytomegaloviral disease, unspecified: Secondary | ICD-10-CM | POA: Insufficient documentation

## 2018-02-23 DIAGNOSIS — Z79899 Other long term (current) drug therapy: Secondary | ICD-10-CM | POA: Diagnosis present

## 2018-02-23 LAB — CBC WITH DIFFERENTIAL/PLATELET
Basophils Absolute: 0 10*3/uL (ref 0–0.1)
Basophils Relative: 0 %
EOS ABS: 0.1 10*3/uL (ref 0–0.7)
EOS PCT: 3 %
HCT: 32 % — ABNORMAL LOW (ref 35.0–47.0)
Hemoglobin: 10.6 g/dL — ABNORMAL LOW (ref 12.0–16.0)
LYMPHS ABS: 1.6 10*3/uL (ref 1.0–3.6)
Lymphocytes Relative: 41 %
MCH: 27.6 pg (ref 26.0–34.0)
MCHC: 33.1 g/dL (ref 32.0–36.0)
MCV: 83.3 fL (ref 80.0–100.0)
MONO ABS: 0.3 10*3/uL (ref 0.2–0.9)
Monocytes Relative: 9 %
Neutro Abs: 1.9 10*3/uL (ref 1.4–6.5)
Neutrophils Relative %: 47 %
PLATELETS: 178 10*3/uL (ref 150–440)
RBC: 3.84 MIL/uL (ref 3.80–5.20)
RDW: 15.4 % — AB (ref 11.5–14.5)
WBC: 4 10*3/uL (ref 3.6–11.0)

## 2018-02-23 LAB — BASIC METABOLIC PANEL
Anion gap: 5 (ref 5–15)
BUN: 41 mg/dL — AB (ref 6–20)
CHLORIDE: 109 mmol/L (ref 101–111)
CO2: 25 mmol/L (ref 22–32)
CREATININE: 1.66 mg/dL — AB (ref 0.44–1.00)
Calcium: 9.4 mg/dL (ref 8.9–10.3)
GFR calc Af Amer: 40 mL/min — ABNORMAL LOW (ref >60)
GFR calc non Af Amer: 34 mL/min — ABNORMAL LOW (ref >60)
GLUCOSE: 100 mg/dL — AB (ref 65–99)
Potassium: 4.2 mmol/L (ref 3.5–5.1)
SODIUM: 139 mmol/L (ref 135–145)

## 2018-02-23 LAB — LIPID PANEL
CHOL/HDL RATIO: 2.2 ratio
CHOLESTEROL: 151 mg/dL (ref 0–200)
HDL: 70 mg/dL (ref >40)
LDL Cholesterol: 63 mg/dL (ref 0–99)
TRIGLYCERIDES: 92 mg/dL (ref <150)
VLDL: 18 mg/dL (ref 0–40)

## 2018-02-23 LAB — HEPATIC FUNCTION PANEL
ALT: 21 U/L (ref 14–54)
AST: 23 U/L (ref 15–41)
Albumin: 4.3 g/dL (ref 3.5–5.0)
Alkaline Phosphatase: 84 U/L (ref 38–126)
BILIRUBIN DIRECT: 0.1 mg/dL (ref 0.1–0.5)
BILIRUBIN INDIRECT: 1 mg/dL — AB (ref 0.3–0.9)
TOTAL PROTEIN: 7.5 g/dL (ref 6.5–8.1)
Total Bilirubin: 1.1 mg/dL (ref 0.3–1.2)

## 2018-02-23 LAB — GAMMA GT: GGT: 34 U/L (ref 7–50)

## 2018-02-23 LAB — MAGNESIUM: Magnesium: 1.8 mg/dL (ref 1.7–2.4)

## 2018-02-23 LAB — PHOSPHORUS: Phosphorus: 3.9 mg/dL (ref 2.5–4.6)

## 2018-02-26 LAB — TACROLIMUS LEVEL: TACROLIMUS (FK506) - LABCORP: 3.8 ng/mL (ref 2.0–20.0)

## 2018-04-01 ENCOUNTER — Other Ambulatory Visit
Admission: RE | Admit: 2018-04-01 | Discharge: 2018-04-01 | Disposition: A | Discharge status: Home / Self Care | Payer: BLUE CROSS/BLUE SHIELD | Source: Ambulatory Visit | Attending: Nephrology | Admitting: Nephrology

## 2018-04-01 DIAGNOSIS — B259 Cytomegaloviral disease, unspecified: Secondary | ICD-10-CM | POA: Diagnosis not present

## 2018-04-01 DIAGNOSIS — Z09 Encounter for follow-up examination after completed treatment for conditions other than malignant neoplasm: Secondary | ICD-10-CM | POA: Insufficient documentation

## 2018-04-01 DIAGNOSIS — Z79899 Other long term (current) drug therapy: Secondary | ICD-10-CM | POA: Diagnosis not present

## 2018-04-01 DIAGNOSIS — Z940 Kidney transplant status: Secondary | ICD-10-CM | POA: Diagnosis not present

## 2018-04-01 DIAGNOSIS — E559 Vitamin D deficiency, unspecified: Secondary | ICD-10-CM | POA: Diagnosis not present

## 2018-04-01 DIAGNOSIS — Z114 Encounter for screening for human immunodeficiency virus [HIV]: Secondary | ICD-10-CM | POA: Insufficient documentation

## 2018-04-01 DIAGNOSIS — E1129 Type 2 diabetes mellitus with other diabetic kidney complication: Secondary | ICD-10-CM | POA: Insufficient documentation

## 2018-04-01 DIAGNOSIS — N390 Urinary tract infection, site not specified: Secondary | ICD-10-CM | POA: Diagnosis not present

## 2018-04-01 DIAGNOSIS — D899 Disorder involving the immune mechanism, unspecified: Secondary | ICD-10-CM | POA: Insufficient documentation

## 2018-04-01 DIAGNOSIS — Z789 Other specified health status: Secondary | ICD-10-CM | POA: Insufficient documentation

## 2018-04-01 DIAGNOSIS — Z9483 Pancreas transplant status: Secondary | ICD-10-CM | POA: Diagnosis not present

## 2018-04-01 DIAGNOSIS — N189 Chronic kidney disease, unspecified: Secondary | ICD-10-CM | POA: Diagnosis not present

## 2018-04-01 DIAGNOSIS — D631 Anemia in chronic kidney disease: Secondary | ICD-10-CM | POA: Diagnosis not present

## 2018-04-01 LAB — CBC WITH DIFFERENTIAL/PLATELET
BASOS PCT: 0 %
Basophils Absolute: 0 10*3/uL (ref 0–0.1)
EOS ABS: 0.1 10*3/uL (ref 0–0.7)
EOS PCT: 3 %
HCT: 34.3 % — ABNORMAL LOW (ref 35.0–47.0)
HEMOGLOBIN: 11 g/dL — AB (ref 12.0–16.0)
Lymphocytes Relative: 35 %
Lymphs Abs: 1.5 10*3/uL (ref 1.0–3.6)
MCH: 27.1 pg (ref 26.0–34.0)
MCHC: 32 g/dL (ref 32.0–36.0)
MCV: 84.6 fL (ref 80.0–100.0)
MONOS PCT: 8 %
Monocytes Absolute: 0.4 10*3/uL (ref 0.2–0.9)
NEUTROS PCT: 54 %
Neutro Abs: 2.3 10*3/uL (ref 1.4–6.5)
PLATELETS: 180 10*3/uL (ref 150–440)
RBC: 4.05 MIL/uL (ref 3.80–5.20)
RDW: 15.3 % — ABNORMAL HIGH (ref 11.5–14.5)
WBC: 4.3 10*3/uL (ref 3.6–11.0)

## 2018-04-01 LAB — BASIC METABOLIC PANEL
ANION GAP: 8 (ref 5–15)
BUN: 47 mg/dL — AB (ref 6–20)
CO2: 24 mmol/L (ref 22–32)
Calcium: 9.1 mg/dL (ref 8.9–10.3)
Chloride: 102 mmol/L (ref 101–111)
Creatinine, Ser: 1.68 mg/dL — ABNORMAL HIGH (ref 0.44–1.00)
GFR calc Af Amer: 39 mL/min — ABNORMAL LOW (ref >60)
GFR, EST NON AFRICAN AMERICAN: 34 mL/min — AB (ref >60)
Glucose, Bld: 274 mg/dL — ABNORMAL HIGH (ref 65–99)
POTASSIUM: 4.5 mmol/L (ref 3.5–5.1)
SODIUM: 134 mmol/L — AB (ref 135–145)

## 2018-04-01 LAB — MAGNESIUM: Magnesium: 1.8 mg/dL (ref 1.7–2.4)

## 2018-04-01 LAB — PHOSPHORUS: PHOSPHORUS: 3.6 mg/dL (ref 2.5–4.6)

## 2018-04-03 LAB — TACROLIMUS LEVEL: Tacrolimus (FK506) - LabCorp: 5.6 ng/mL (ref 2.0–20.0)

## 2018-04-25 ENCOUNTER — Other Ambulatory Visit: Payer: Self-pay | Admitting: Student

## 2018-04-25 DIAGNOSIS — R2231 Localized swelling, mass and lump, right upper limb: Secondary | ICD-10-CM

## 2018-05-10 ENCOUNTER — Other Ambulatory Visit: Payer: BLUE CROSS/BLUE SHIELD

## 2018-06-10 ENCOUNTER — Other Ambulatory Visit: Payer: Self-pay | Admitting: Obstetrics and Gynecology

## 2018-06-10 DIAGNOSIS — Z1231 Encounter for screening mammogram for malignant neoplasm of breast: Secondary | ICD-10-CM

## 2018-06-14 ENCOUNTER — Ambulatory Visit
Admission: RE | Admit: 2018-06-14 | Discharge: 2018-06-14 | Disposition: A | Discharge status: Home / Self Care | Payer: BLUE CROSS/BLUE SHIELD | Source: Ambulatory Visit | Attending: Obstetrics and Gynecology | Admitting: Obstetrics and Gynecology

## 2018-06-14 DIAGNOSIS — Z1231 Encounter for screening mammogram for malignant neoplasm of breast: Secondary | ICD-10-CM | POA: Insufficient documentation

## 2018-07-13 ENCOUNTER — Other Ambulatory Visit
Admission: RE | Admit: 2018-07-13 | Discharge: 2018-07-13 | Disposition: A | Discharge status: Home / Self Care | Payer: BLUE CROSS/BLUE SHIELD | Source: Ambulatory Visit | Attending: Nephrology | Admitting: Nephrology

## 2018-07-13 DIAGNOSIS — Z940 Kidney transplant status: Secondary | ICD-10-CM | POA: Diagnosis not present

## 2018-07-13 DIAGNOSIS — E1129 Type 2 diabetes mellitus with other diabetic kidney complication: Secondary | ICD-10-CM | POA: Insufficient documentation

## 2018-07-13 DIAGNOSIS — D631 Anemia in chronic kidney disease: Secondary | ICD-10-CM | POA: Insufficient documentation

## 2018-07-13 DIAGNOSIS — B259 Cytomegaloviral disease, unspecified: Secondary | ICD-10-CM | POA: Diagnosis not present

## 2018-07-13 DIAGNOSIS — Z9483 Pancreas transplant status: Secondary | ICD-10-CM | POA: Insufficient documentation

## 2018-07-13 DIAGNOSIS — Z09 Encounter for follow-up examination after completed treatment for conditions other than malignant neoplasm: Secondary | ICD-10-CM | POA: Diagnosis not present

## 2018-07-13 DIAGNOSIS — D899 Disorder involving the immune mechanism, unspecified: Secondary | ICD-10-CM | POA: Insufficient documentation

## 2018-07-13 DIAGNOSIS — Z789 Other specified health status: Secondary | ICD-10-CM | POA: Insufficient documentation

## 2018-07-13 DIAGNOSIS — N189 Chronic kidney disease, unspecified: Secondary | ICD-10-CM | POA: Diagnosis not present

## 2018-07-13 DIAGNOSIS — N390 Urinary tract infection, site not specified: Secondary | ICD-10-CM | POA: Diagnosis not present

## 2018-07-13 DIAGNOSIS — Z114 Encounter for screening for human immunodeficiency virus [HIV]: Secondary | ICD-10-CM | POA: Insufficient documentation

## 2018-07-13 DIAGNOSIS — E559 Vitamin D deficiency, unspecified: Secondary | ICD-10-CM | POA: Insufficient documentation

## 2018-07-13 DIAGNOSIS — E1122 Type 2 diabetes mellitus with diabetic chronic kidney disease: Secondary | ICD-10-CM | POA: Insufficient documentation

## 2018-07-13 DIAGNOSIS — Z79899 Other long term (current) drug therapy: Secondary | ICD-10-CM | POA: Insufficient documentation

## 2018-07-13 DIAGNOSIS — B9689 Other specified bacterial agents as the cause of diseases classified elsewhere: Secondary | ICD-10-CM | POA: Diagnosis not present

## 2018-07-13 LAB — CBC WITH DIFFERENTIAL/PLATELET
Basophils Absolute: 0 10*3/uL (ref 0–0.1)
Basophils Relative: 1 %
Eosinophils Absolute: 0.1 10*3/uL (ref 0–0.7)
Eosinophils Relative: 3 %
HCT: 33.9 % — ABNORMAL LOW (ref 35.0–47.0)
Hemoglobin: 11 g/dL — ABNORMAL LOW (ref 12.0–16.0)
LYMPHS ABS: 1.4 10*3/uL (ref 1.0–3.6)
LYMPHS PCT: 32 %
MCH: 27 pg (ref 26.0–34.0)
MCHC: 32.4 g/dL (ref 32.0–36.0)
MCV: 83.3 fL (ref 80.0–100.0)
MONO ABS: 0.4 10*3/uL (ref 0.2–0.9)
MONOS PCT: 9 %
NEUTROS PCT: 55 %
Neutro Abs: 2.4 10*3/uL (ref 1.4–6.5)
PLATELETS: 187 10*3/uL (ref 150–440)
RBC: 4.07 MIL/uL (ref 3.80–5.20)
RDW: 15 % — AB (ref 11.5–14.5)
WBC: 4.4 10*3/uL (ref 3.6–11.0)

## 2018-07-13 LAB — BASIC METABOLIC PANEL
ANION GAP: 8 (ref 5–15)
BUN: 37 mg/dL — ABNORMAL HIGH (ref 6–20)
CHLORIDE: 105 mmol/L (ref 98–111)
CO2: 26 mmol/L (ref 22–32)
Calcium: 9.4 mg/dL (ref 8.9–10.3)
Creatinine, Ser: 1.72 mg/dL — ABNORMAL HIGH (ref 0.44–1.00)
GFR, EST AFRICAN AMERICAN: 38 mL/min — AB (ref >60)
GFR, EST NON AFRICAN AMERICAN: 33 mL/min — AB (ref >60)
Glucose, Bld: 83 mg/dL (ref 70–99)
POTASSIUM: 4.4 mmol/L (ref 3.5–5.1)
SODIUM: 139 mmol/L (ref 135–145)

## 2018-07-13 LAB — MAGNESIUM: Magnesium: 1.7 mg/dL (ref 1.7–2.4)

## 2018-07-13 LAB — GAMMA GT: GGT: 29 U/L (ref 7–50)

## 2018-07-13 LAB — ALKALINE PHOSPHATASE: Alkaline Phosphatase: 88 U/L (ref 38–126)

## 2018-07-13 LAB — AST: AST: 26 U/L (ref 15–41)

## 2018-07-13 LAB — PHOSPHORUS: PHOSPHORUS: 4.2 mg/dL (ref 2.5–4.6)

## 2018-07-13 LAB — ALT: ALT: 29 U/L (ref 0–44)

## 2018-07-13 LAB — CHOLESTEROL, TOTAL: CHOLESTEROL: 154 mg/dL (ref 0–200)

## 2018-07-13 LAB — ALBUMIN: ALBUMIN: 4.2 g/dL (ref 3.5–5.0)

## 2018-07-13 LAB — TRIGLYCERIDES: Triglycerides: 97 mg/dL (ref <150)

## 2018-07-13 LAB — BILIRUBIN, TOTAL: Total Bilirubin: 1.2 mg/dL (ref 0.3–1.2)

## 2018-07-13 LAB — HDL CHOLESTEROL: HDL: 57 mg/dL (ref >40)

## 2018-07-15 LAB — LDL CHOLESTEROL, DIRECT: Direct LDL: 74 mg/dL (ref 0–99)

## 2018-07-16 LAB — TACROLIMUS LEVEL: Tacrolimus (FK506) - LabCorp: 4.7 ng/mL (ref 2.0–20.0)

## 2018-08-31 ENCOUNTER — Other Ambulatory Visit: Admission: RE | Admit: 2018-08-31 | Payer: BLUE CROSS/BLUE SHIELD | Source: Ambulatory Visit | Admitting: *Deleted

## 2018-09-07 ENCOUNTER — Other Ambulatory Visit
Admission: RE | Admit: 2018-09-07 | Discharge: 2018-09-07 | Disposition: A | Discharge status: Home / Self Care | Payer: BLUE CROSS/BLUE SHIELD | Source: Ambulatory Visit | Attending: Nephrology | Admitting: Nephrology

## 2018-09-07 DIAGNOSIS — B259 Cytomegaloviral disease, unspecified: Secondary | ICD-10-CM | POA: Diagnosis not present

## 2018-09-07 DIAGNOSIS — E559 Vitamin D deficiency, unspecified: Secondary | ICD-10-CM | POA: Insufficient documentation

## 2018-09-07 DIAGNOSIS — D899 Disorder involving the immune mechanism, unspecified: Secondary | ICD-10-CM | POA: Insufficient documentation

## 2018-09-07 DIAGNOSIS — Z789 Other specified health status: Secondary | ICD-10-CM | POA: Insufficient documentation

## 2018-09-07 DIAGNOSIS — D631 Anemia in chronic kidney disease: Secondary | ICD-10-CM | POA: Insufficient documentation

## 2018-09-07 DIAGNOSIS — N390 Urinary tract infection, site not specified: Secondary | ICD-10-CM | POA: Insufficient documentation

## 2018-09-07 DIAGNOSIS — Z09 Encounter for follow-up examination after completed treatment for conditions other than malignant neoplasm: Secondary | ICD-10-CM | POA: Diagnosis not present

## 2018-09-07 DIAGNOSIS — Z79899 Other long term (current) drug therapy: Secondary | ICD-10-CM | POA: Diagnosis present

## 2018-09-07 DIAGNOSIS — E1129 Type 2 diabetes mellitus with other diabetic kidney complication: Secondary | ICD-10-CM | POA: Diagnosis not present

## 2018-09-07 DIAGNOSIS — Z114 Encounter for screening for human immunodeficiency virus [HIV]: Secondary | ICD-10-CM | POA: Insufficient documentation

## 2018-09-07 DIAGNOSIS — Z9483 Pancreas transplant status: Secondary | ICD-10-CM | POA: Insufficient documentation

## 2018-09-07 DIAGNOSIS — Z940 Kidney transplant status: Secondary | ICD-10-CM | POA: Insufficient documentation

## 2018-09-07 LAB — CBC WITH DIFFERENTIAL/PLATELET
Abs Immature Granulocytes: 0.01 10*3/uL (ref 0.00–0.07)
BASOS ABS: 0 10*3/uL (ref 0.0–0.1)
Basophils Relative: 0 %
EOS ABS: 0.1 10*3/uL (ref 0.0–0.5)
EOS PCT: 3 %
HCT: 34.5 % — ABNORMAL LOW (ref 36.0–46.0)
Hemoglobin: 10.7 g/dL — ABNORMAL LOW (ref 12.0–15.0)
Immature Granulocytes: 0 %
Lymphocytes Relative: 33 %
Lymphs Abs: 1.6 10*3/uL (ref 0.7–4.0)
MCH: 26.6 pg (ref 26.0–34.0)
MCHC: 31 g/dL (ref 30.0–36.0)
MCV: 85.6 fL (ref 80.0–100.0)
Monocytes Absolute: 0.5 10*3/uL (ref 0.1–1.0)
Monocytes Relative: 10 %
NRBC: 0 % (ref 0.0–0.2)
Neutro Abs: 2.6 10*3/uL (ref 1.7–7.7)
Neutrophils Relative %: 54 %
Platelets: 177 10*3/uL (ref 150–400)
RBC: 4.03 MIL/uL (ref 3.87–5.11)
RDW: 14.3 % (ref 11.5–15.5)
WBC: 4.8 10*3/uL (ref 4.0–10.5)

## 2018-09-07 LAB — BASIC METABOLIC PANEL
ANION GAP: 7 (ref 5–15)
BUN: 42 mg/dL — ABNORMAL HIGH (ref 6–20)
CHLORIDE: 103 mmol/L (ref 98–111)
CO2: 28 mmol/L (ref 22–32)
Calcium: 9.5 mg/dL (ref 8.9–10.3)
Creatinine, Ser: 1.79 mg/dL — ABNORMAL HIGH (ref 0.44–1.00)
GFR calc Af Amer: 36 mL/min — ABNORMAL LOW (ref >60)
GFR, EST NON AFRICAN AMERICAN: 31 mL/min — AB (ref >60)
GLUCOSE: 123 mg/dL — AB (ref 70–99)
POTASSIUM: 4.5 mmol/L (ref 3.5–5.1)
Sodium: 138 mmol/L (ref 135–145)

## 2018-09-07 LAB — MAGNESIUM: Magnesium: 1.7 mg/dL (ref 1.7–2.4)

## 2018-09-07 LAB — PHOSPHORUS: Phosphorus: 3.9 mg/dL (ref 2.5–4.6)

## 2018-09-10 LAB — TACROLIMUS LEVEL: Tacrolimus (FK506) - LabCorp: 7.1 ng/mL (ref 2.0–20.0)

## 2018-10-19 ENCOUNTER — Other Ambulatory Visit
Admission: RE | Admit: 2018-10-19 | Discharge: 2018-10-19 | Disposition: A | Discharge status: Home / Self Care | Payer: BLUE CROSS/BLUE SHIELD | Source: Ambulatory Visit | Attending: Nephrology | Admitting: Nephrology

## 2018-10-19 DIAGNOSIS — B259 Cytomegaloviral disease, unspecified: Secondary | ICD-10-CM | POA: Diagnosis not present

## 2018-10-19 DIAGNOSIS — D899 Disorder involving the immune mechanism, unspecified: Secondary | ICD-10-CM | POA: Diagnosis present

## 2018-10-19 DIAGNOSIS — Z9483 Pancreas transplant status: Secondary | ICD-10-CM | POA: Insufficient documentation

## 2018-10-19 DIAGNOSIS — E559 Vitamin D deficiency, unspecified: Secondary | ICD-10-CM | POA: Insufficient documentation

## 2018-10-19 DIAGNOSIS — Z940 Kidney transplant status: Secondary | ICD-10-CM | POA: Insufficient documentation

## 2018-10-19 DIAGNOSIS — Z09 Encounter for follow-up examination after completed treatment for conditions other than malignant neoplasm: Secondary | ICD-10-CM | POA: Diagnosis not present

## 2018-10-19 DIAGNOSIS — T8610 Unspecified complication of kidney transplant: Secondary | ICD-10-CM | POA: Insufficient documentation

## 2018-10-19 DIAGNOSIS — N189 Chronic kidney disease, unspecified: Secondary | ICD-10-CM | POA: Insufficient documentation

## 2018-10-19 DIAGNOSIS — E1129 Type 2 diabetes mellitus with other diabetic kidney complication: Secondary | ICD-10-CM | POA: Insufficient documentation

## 2018-10-19 DIAGNOSIS — Z114 Encounter for screening for human immunodeficiency virus [HIV]: Secondary | ICD-10-CM | POA: Insufficient documentation

## 2018-10-19 DIAGNOSIS — D631 Anemia in chronic kidney disease: Secondary | ICD-10-CM | POA: Diagnosis not present

## 2018-10-19 DIAGNOSIS — Z79899 Other long term (current) drug therapy: Secondary | ICD-10-CM | POA: Diagnosis not present

## 2018-10-19 LAB — LIPID PANEL
CHOL/HDL RATIO: 2.2 ratio
Cholesterol: 160 mg/dL (ref 0–200)
HDL: 74 mg/dL (ref >40)
LDL CALC: 77 mg/dL (ref 0–99)
TRIGLYCERIDES: 43 mg/dL (ref <150)
VLDL: 9 mg/dL (ref 0–40)

## 2018-10-19 LAB — CBC WITH DIFFERENTIAL/PLATELET
ABS IMMATURE GRANULOCYTES: 0.01 10*3/uL (ref 0.00–0.07)
Basophils Absolute: 0 10*3/uL (ref 0.0–0.1)
Basophils Relative: 0 %
EOS PCT: 3 %
Eosinophils Absolute: 0.1 10*3/uL (ref 0.0–0.5)
HCT: 33.7 % — ABNORMAL LOW (ref 36.0–46.0)
HEMOGLOBIN: 10.5 g/dL — AB (ref 12.0–15.0)
Immature Granulocytes: 0 %
LYMPHS PCT: 37 %
Lymphs Abs: 1.7 10*3/uL (ref 0.7–4.0)
MCH: 26.7 pg (ref 26.0–34.0)
MCHC: 31.2 g/dL (ref 30.0–36.0)
MCV: 85.8 fL (ref 80.0–100.0)
MONOS PCT: 10 %
Monocytes Absolute: 0.5 10*3/uL (ref 0.1–1.0)
NEUTROS ABS: 2.3 10*3/uL (ref 1.7–7.7)
Neutrophils Relative %: 50 %
Platelets: 191 10*3/uL (ref 150–400)
RBC: 3.93 MIL/uL (ref 3.87–5.11)
RDW: 14.4 % (ref 11.5–15.5)
WBC: 4.6 10*3/uL (ref 4.0–10.5)
nRBC: 0 % (ref 0.0–0.2)

## 2018-10-19 LAB — ALBUMIN: Albumin: 4.4 g/dL (ref 3.5–5.0)

## 2018-10-19 LAB — ALT: ALT: 19 U/L (ref 0–44)

## 2018-10-19 LAB — ALKALINE PHOSPHATASE: Alkaline Phosphatase: 114 U/L (ref 38–126)

## 2018-10-19 LAB — BASIC METABOLIC PANEL
Anion gap: 7 (ref 5–15)
BUN: 39 mg/dL — AB (ref 6–20)
CHLORIDE: 105 mmol/L (ref 98–111)
CO2: 24 mmol/L (ref 22–32)
CREATININE: 1.65 mg/dL — AB (ref 0.44–1.00)
Calcium: 9.1 mg/dL (ref 8.9–10.3)
GFR calc Af Amer: 41 mL/min — ABNORMAL LOW (ref >60)
GFR calc non Af Amer: 35 mL/min — ABNORMAL LOW (ref >60)
GLUCOSE: 85 mg/dL (ref 70–99)
POTASSIUM: 4.8 mmol/L (ref 3.5–5.1)
SODIUM: 136 mmol/L (ref 135–145)

## 2018-10-19 LAB — PHOSPHORUS: Phosphorus: 3.8 mg/dL (ref 2.5–4.6)

## 2018-10-19 LAB — BILIRUBIN, TOTAL: Total Bilirubin: 0.9 mg/dL (ref 0.3–1.2)

## 2018-10-19 LAB — MAGNESIUM: Magnesium: 1.8 mg/dL (ref 1.7–2.4)

## 2018-10-19 LAB — AST: AST: 24 U/L (ref 15–41)

## 2018-10-20 LAB — GAMMA GT: GGT: 30 U/L (ref 7–50)

## 2018-10-22 LAB — TACROLIMUS LEVEL: Tacrolimus (FK506) - LabCorp: 3.5 ng/mL (ref 2.0–20.0)

## 2018-11-12 ENCOUNTER — Other Ambulatory Visit
Admission: RE | Admit: 2018-11-12 | Discharge: 2018-11-12 | Disposition: A | Discharge status: Home / Self Care | Payer: BLUE CROSS/BLUE SHIELD | Source: Ambulatory Visit | Attending: Nephrology | Admitting: Nephrology

## 2018-11-12 DIAGNOSIS — D631 Anemia in chronic kidney disease: Secondary | ICD-10-CM | POA: Diagnosis not present

## 2018-11-12 DIAGNOSIS — D899 Disorder involving the immune mechanism, unspecified: Secondary | ICD-10-CM | POA: Insufficient documentation

## 2018-11-12 DIAGNOSIS — B259 Cytomegaloviral disease, unspecified: Secondary | ICD-10-CM | POA: Insufficient documentation

## 2018-11-12 DIAGNOSIS — Z9483 Pancreas transplant status: Secondary | ICD-10-CM | POA: Diagnosis not present

## 2018-11-12 DIAGNOSIS — Z79899 Other long term (current) drug therapy: Secondary | ICD-10-CM | POA: Insufficient documentation

## 2018-11-12 DIAGNOSIS — Z940 Kidney transplant status: Secondary | ICD-10-CM | POA: Diagnosis not present

## 2018-11-12 DIAGNOSIS — E1129 Type 2 diabetes mellitus with other diabetic kidney complication: Secondary | ICD-10-CM | POA: Insufficient documentation

## 2018-11-12 LAB — BASIC METABOLIC PANEL
ANION GAP: 6 (ref 5–15)
BUN: 37 mg/dL — ABNORMAL HIGH (ref 6–20)
CALCIUM: 9.3 mg/dL (ref 8.9–10.3)
CO2: 25 mmol/L (ref 22–32)
Chloride: 104 mmol/L (ref 98–111)
Creatinine, Ser: 1.68 mg/dL — ABNORMAL HIGH (ref 0.44–1.00)
GFR calc Af Amer: 40 mL/min — ABNORMAL LOW (ref >60)
GFR calc non Af Amer: 35 mL/min — ABNORMAL LOW (ref >60)
Glucose, Bld: 112 mg/dL — ABNORMAL HIGH (ref 70–99)
Potassium: 4.4 mmol/L (ref 3.5–5.1)
Sodium: 135 mmol/L (ref 135–145)

## 2018-11-12 LAB — CBC WITH DIFFERENTIAL/PLATELET
Abs Immature Granulocytes: 0.01 10*3/uL (ref 0.00–0.07)
Basophils Absolute: 0 10*3/uL (ref 0.0–0.1)
Basophils Relative: 0 %
Eosinophils Absolute: 0.1 10*3/uL (ref 0.0–0.5)
Eosinophils Relative: 3 %
HCT: 35.7 % — ABNORMAL LOW (ref 36.0–46.0)
Hemoglobin: 11.2 g/dL — ABNORMAL LOW (ref 12.0–15.0)
Immature Granulocytes: 0 %
LYMPHS PCT: 36 %
Lymphs Abs: 1.8 10*3/uL (ref 0.7–4.0)
MCH: 26.9 pg (ref 26.0–34.0)
MCHC: 31.4 g/dL (ref 30.0–36.0)
MCV: 85.6 fL (ref 80.0–100.0)
MONO ABS: 0.5 10*3/uL (ref 0.1–1.0)
Monocytes Relative: 9 %
Neutro Abs: 2.6 10*3/uL (ref 1.7–7.7)
Neutrophils Relative %: 52 %
Platelets: 183 10*3/uL (ref 150–400)
RBC: 4.17 MIL/uL (ref 3.87–5.11)
RDW: 14.1 % (ref 11.5–15.5)
WBC: 5 10*3/uL (ref 4.0–10.5)
nRBC: 0 % (ref 0.0–0.2)

## 2018-11-12 LAB — MAGNESIUM: Magnesium: 1.8 mg/dL (ref 1.7–2.4)

## 2018-11-12 LAB — PHOSPHORUS: Phosphorus: 3.7 mg/dL (ref 2.5–4.6)

## 2018-11-14 LAB — TACROLIMUS LEVEL: Tacrolimus (FK506) - LabCorp: 4.5 ng/mL (ref 2.0–20.0)

## 2018-12-28 ENCOUNTER — Encounter
Admission: RE | Admit: 2018-12-28 | Discharge: 2018-12-28 | Disposition: A | Discharge status: Home / Self Care | Payer: BLUE CROSS/BLUE SHIELD | Source: Ambulatory Visit | Attending: Nephrology | Admitting: Nephrology

## 2018-12-28 DIAGNOSIS — B259 Cytomegaloviral disease, unspecified: Secondary | ICD-10-CM | POA: Diagnosis not present

## 2018-12-28 DIAGNOSIS — Z940 Kidney transplant status: Secondary | ICD-10-CM | POA: Diagnosis not present

## 2018-12-28 DIAGNOSIS — N189 Chronic kidney disease, unspecified: Secondary | ICD-10-CM | POA: Insufficient documentation

## 2018-12-28 DIAGNOSIS — Z114 Encounter for screening for human immunodeficiency virus [HIV]: Secondary | ICD-10-CM | POA: Diagnosis not present

## 2018-12-28 DIAGNOSIS — Z9483 Pancreas transplant status: Secondary | ICD-10-CM | POA: Diagnosis not present

## 2018-12-28 DIAGNOSIS — Z789 Other specified health status: Secondary | ICD-10-CM | POA: Diagnosis not present

## 2018-12-28 DIAGNOSIS — N390 Urinary tract infection, site not specified: Secondary | ICD-10-CM | POA: Insufficient documentation

## 2018-12-28 DIAGNOSIS — D631 Anemia in chronic kidney disease: Secondary | ICD-10-CM | POA: Insufficient documentation

## 2018-12-28 DIAGNOSIS — E1129 Type 2 diabetes mellitus with other diabetic kidney complication: Secondary | ICD-10-CM | POA: Diagnosis not present

## 2018-12-28 DIAGNOSIS — Z79899 Other long term (current) drug therapy: Secondary | ICD-10-CM | POA: Diagnosis not present

## 2018-12-28 DIAGNOSIS — E559 Vitamin D deficiency, unspecified: Secondary | ICD-10-CM | POA: Insufficient documentation

## 2018-12-28 DIAGNOSIS — D899 Disorder involving the immune mechanism, unspecified: Secondary | ICD-10-CM | POA: Insufficient documentation

## 2018-12-28 LAB — CBC WITH DIFFERENTIAL/PLATELET
Abs Immature Granulocytes: 0.01 10*3/uL (ref 0.00–0.07)
Basophils Absolute: 0 10*3/uL (ref 0.0–0.1)
Basophils Relative: 0 %
Eosinophils Absolute: 0.1 10*3/uL (ref 0.0–0.5)
Eosinophils Relative: 2 %
HCT: 35.7 % — ABNORMAL LOW (ref 36.0–46.0)
Hemoglobin: 11.3 g/dL — ABNORMAL LOW (ref 12.0–15.0)
Immature Granulocytes: 0 %
Lymphocytes Relative: 37 %
Lymphs Abs: 2 10*3/uL (ref 0.7–4.0)
MCH: 26.8 pg (ref 26.0–34.0)
MCHC: 31.7 g/dL (ref 30.0–36.0)
MCV: 84.8 fL (ref 80.0–100.0)
Monocytes Absolute: 0.5 10*3/uL (ref 0.1–1.0)
Monocytes Relative: 9 %
Neutro Abs: 2.8 10*3/uL (ref 1.7–7.7)
Neutrophils Relative %: 52 %
Platelets: 160 10*3/uL (ref 150–400)
RBC: 4.21 MIL/uL (ref 3.87–5.11)
RDW: 13.9 % (ref 11.5–15.5)
WBC: 5.5 10*3/uL (ref 4.0–10.5)
nRBC: 0 % (ref 0.0–0.2)

## 2018-12-28 LAB — BASIC METABOLIC PANEL
Anion gap: 7 (ref 5–15)
BUN: 36 mg/dL — ABNORMAL HIGH (ref 6–20)
CO2: 26 mmol/L (ref 22–32)
Calcium: 9.2 mg/dL (ref 8.9–10.3)
Chloride: 101 mmol/L (ref 98–111)
Creatinine, Ser: 1.73 mg/dL — ABNORMAL HIGH (ref 0.44–1.00)
GFR calc Af Amer: 38 mL/min — ABNORMAL LOW (ref >60)
GFR calc non Af Amer: 33 mL/min — ABNORMAL LOW (ref >60)
Glucose, Bld: 183 mg/dL — ABNORMAL HIGH (ref 70–99)
Potassium: 4.6 mmol/L (ref 3.5–5.1)
Sodium: 134 mmol/L — ABNORMAL LOW (ref 135–145)

## 2018-12-28 LAB — PHOSPHORUS: PHOSPHORUS: 3.9 mg/dL (ref 2.5–4.6)

## 2018-12-30 LAB — TACROLIMUS LEVEL: Tacrolimus (FK506) - LabCorp: 7.1 ng/mL (ref 2.0–20.0)

## 2019-02-15 ENCOUNTER — Other Ambulatory Visit
Admission: RE | Admit: 2019-02-15 | Discharge: 2019-02-15 | Disposition: A | Discharge status: Home / Self Care | Payer: BLUE CROSS/BLUE SHIELD | Attending: Nephrology | Admitting: Nephrology

## 2019-02-15 ENCOUNTER — Other Ambulatory Visit: Payer: Self-pay

## 2019-02-15 DIAGNOSIS — Z09 Encounter for follow-up examination after completed treatment for conditions other than malignant neoplasm: Secondary | ICD-10-CM | POA: Diagnosis not present

## 2019-02-15 DIAGNOSIS — D899 Disorder involving the immune mechanism, unspecified: Secondary | ICD-10-CM | POA: Diagnosis not present

## 2019-02-15 DIAGNOSIS — N390 Urinary tract infection, site not specified: Secondary | ICD-10-CM | POA: Diagnosis not present

## 2019-02-15 DIAGNOSIS — Z114 Encounter for screening for human immunodeficiency virus [HIV]: Secondary | ICD-10-CM | POA: Insufficient documentation

## 2019-02-15 DIAGNOSIS — Z940 Kidney transplant status: Secondary | ICD-10-CM | POA: Diagnosis not present

## 2019-02-15 DIAGNOSIS — Z9483 Pancreas transplant status: Secondary | ICD-10-CM | POA: Diagnosis not present

## 2019-02-15 DIAGNOSIS — Z789 Other specified health status: Secondary | ICD-10-CM | POA: Insufficient documentation

## 2019-02-15 DIAGNOSIS — B259 Cytomegaloviral disease, unspecified: Secondary | ICD-10-CM | POA: Diagnosis not present

## 2019-02-15 DIAGNOSIS — Z79899 Other long term (current) drug therapy: Secondary | ICD-10-CM | POA: Diagnosis not present

## 2019-02-15 DIAGNOSIS — E559 Vitamin D deficiency, unspecified: Secondary | ICD-10-CM | POA: Insufficient documentation

## 2019-02-15 DIAGNOSIS — D631 Anemia in chronic kidney disease: Secondary | ICD-10-CM | POA: Diagnosis not present

## 2019-02-15 DIAGNOSIS — E1129 Type 2 diabetes mellitus with other diabetic kidney complication: Secondary | ICD-10-CM | POA: Diagnosis not present

## 2019-02-15 LAB — BASIC METABOLIC PANEL
Anion gap: 8 (ref 5–15)
BUN: 38 mg/dL — ABNORMAL HIGH (ref 6–20)
CO2: 28 mmol/L (ref 22–32)
Calcium: 9.5 mg/dL (ref 8.9–10.3)
Chloride: 103 mmol/L (ref 98–111)
Creatinine, Ser: 1.72 mg/dL — ABNORMAL HIGH (ref 0.44–1.00)
GFR calc Af Amer: 39 mL/min — ABNORMAL LOW (ref >60)
GFR calc non Af Amer: 33 mL/min — ABNORMAL LOW (ref >60)
Glucose, Bld: 108 mg/dL — ABNORMAL HIGH (ref 70–99)
Potassium: 4.5 mmol/L (ref 3.5–5.1)
Sodium: 139 mmol/L (ref 135–145)

## 2019-02-15 LAB — CBC WITH DIFFERENTIAL/PLATELET
Abs Immature Granulocytes: 0.01 10*3/uL (ref 0.00–0.07)
Basophils Absolute: 0 10*3/uL (ref 0.0–0.1)
Basophils Relative: 0 %
Eosinophils Absolute: 0.1 10*3/uL (ref 0.0–0.5)
Eosinophils Relative: 2 %
HCT: 36.7 % (ref 36.0–46.0)
Hemoglobin: 11.3 g/dL — ABNORMAL LOW (ref 12.0–15.0)
Immature Granulocytes: 0 %
Lymphocytes Relative: 41 %
Lymphs Abs: 1.9 10*3/uL (ref 0.7–4.0)
MCH: 26.9 pg (ref 26.0–34.0)
MCHC: 30.8 g/dL (ref 30.0–36.0)
MCV: 87.4 fL (ref 80.0–100.0)
Monocytes Absolute: 0.4 10*3/uL (ref 0.1–1.0)
Monocytes Relative: 8 %
Neutro Abs: 2.3 10*3/uL (ref 1.7–7.7)
Neutrophils Relative %: 49 %
Platelets: 183 10*3/uL (ref 150–400)
RBC: 4.2 MIL/uL (ref 3.87–5.11)
RDW: 14 % (ref 11.5–15.5)
WBC: 4.7 10*3/uL (ref 4.0–10.5)
nRBC: 0 % (ref 0.0–0.2)

## 2019-02-15 LAB — PHOSPHORUS: Phosphorus: 3.9 mg/dL (ref 2.5–4.6)

## 2019-02-15 LAB — MAGNESIUM: Magnesium: 1.8 mg/dL (ref 1.7–2.4)

## 2019-02-18 LAB — TACROLIMUS LEVEL: Tacrolimus (FK506) - LabCorp: 3.6 ng/mL (ref 2.0–20.0)

## 2019-04-05 ENCOUNTER — Other Ambulatory Visit
Admission: RE | Admit: 2019-04-05 | Discharge: 2019-04-05 | Disposition: A | Discharge status: Home / Self Care | Payer: BLUE CROSS/BLUE SHIELD | Attending: Nephrology | Admitting: Nephrology

## 2019-04-05 DIAGNOSIS — E559 Vitamin D deficiency, unspecified: Secondary | ICD-10-CM | POA: Insufficient documentation

## 2019-04-05 DIAGNOSIS — Z789 Other specified health status: Secondary | ICD-10-CM | POA: Diagnosis not present

## 2019-04-05 DIAGNOSIS — N189 Chronic kidney disease, unspecified: Secondary | ICD-10-CM | POA: Diagnosis not present

## 2019-04-05 DIAGNOSIS — Z09 Encounter for follow-up examination after completed treatment for conditions other than malignant neoplasm: Secondary | ICD-10-CM | POA: Diagnosis not present

## 2019-04-05 DIAGNOSIS — Z79899 Other long term (current) drug therapy: Secondary | ICD-10-CM | POA: Insufficient documentation

## 2019-04-05 DIAGNOSIS — N390 Urinary tract infection, site not specified: Secondary | ICD-10-CM | POA: Diagnosis not present

## 2019-04-05 DIAGNOSIS — Z940 Kidney transplant status: Secondary | ICD-10-CM | POA: Diagnosis not present

## 2019-04-05 DIAGNOSIS — Z114 Encounter for screening for human immunodeficiency virus [HIV]: Secondary | ICD-10-CM | POA: Insufficient documentation

## 2019-04-05 DIAGNOSIS — E1129 Type 2 diabetes mellitus with other diabetic kidney complication: Secondary | ICD-10-CM | POA: Diagnosis not present

## 2019-04-05 DIAGNOSIS — B259 Cytomegaloviral disease, unspecified: Secondary | ICD-10-CM | POA: Diagnosis not present

## 2019-04-05 DIAGNOSIS — D631 Anemia in chronic kidney disease: Secondary | ICD-10-CM | POA: Diagnosis not present

## 2019-04-05 DIAGNOSIS — D899 Disorder involving the immune mechanism, unspecified: Secondary | ICD-10-CM | POA: Diagnosis not present

## 2019-04-05 DIAGNOSIS — T8610 Unspecified complication of kidney transplant: Secondary | ICD-10-CM | POA: Insufficient documentation

## 2019-04-05 DIAGNOSIS — Z9483 Pancreas transplant status: Secondary | ICD-10-CM | POA: Insufficient documentation

## 2019-04-05 LAB — BASIC METABOLIC PANEL
Anion gap: 11 (ref 5–15)
BUN: 44 mg/dL — ABNORMAL HIGH (ref 6–20)
CO2: 23 mmol/L (ref 22–32)
Calcium: 9.2 mg/dL (ref 8.9–10.3)
Chloride: 104 mmol/L (ref 98–111)
Creatinine, Ser: 1.59 mg/dL — ABNORMAL HIGH (ref 0.44–1.00)
GFR calc Af Amer: 43 mL/min — ABNORMAL LOW (ref >60)
GFR calc non Af Amer: 37 mL/min — ABNORMAL LOW (ref >60)
Glucose, Bld: 177 mg/dL — ABNORMAL HIGH (ref 70–99)
Potassium: 4.6 mmol/L (ref 3.5–5.1)
Sodium: 138 mmol/L (ref 135–145)

## 2019-04-05 LAB — CBC WITH DIFFERENTIAL/PLATELET
Abs Immature Granulocytes: 0.01 10*3/uL (ref 0.00–0.07)
Basophils Absolute: 0 10*3/uL (ref 0.0–0.1)
Basophils Relative: 0 %
Eosinophils Absolute: 0.1 10*3/uL (ref 0.0–0.5)
Eosinophils Relative: 2 %
HCT: 35.6 % — ABNORMAL LOW (ref 36.0–46.0)
Hemoglobin: 11 g/dL — ABNORMAL LOW (ref 12.0–15.0)
Immature Granulocytes: 0 %
Lymphocytes Relative: 28 %
Lymphs Abs: 1.5 10*3/uL (ref 0.7–4.0)
MCH: 27.1 pg (ref 26.0–34.0)
MCHC: 30.9 g/dL (ref 30.0–36.0)
MCV: 87.7 fL (ref 80.0–100.0)
Monocytes Absolute: 0.3 10*3/uL (ref 0.1–1.0)
Monocytes Relative: 6 %
Neutro Abs: 3.5 10*3/uL (ref 1.7–7.7)
Neutrophils Relative %: 64 %
Platelets: 177 10*3/uL (ref 150–400)
RBC: 4.06 MIL/uL (ref 3.87–5.11)
RDW: 13.7 % (ref 11.5–15.5)
WBC: 5.5 10*3/uL (ref 4.0–10.5)
nRBC: 0 % (ref 0.0–0.2)

## 2019-04-05 LAB — MAGNESIUM: Magnesium: 1.8 mg/dL (ref 1.7–2.4)

## 2019-04-05 LAB — PHOSPHORUS: Phosphorus: 4.4 mg/dL (ref 2.5–4.6)

## 2019-04-08 LAB — TACROLIMUS LEVEL: Tacrolimus (FK506) - LabCorp: 4.6 ng/mL (ref 2.0–20.0)

## 2019-05-03 ENCOUNTER — Other Ambulatory Visit
Admission: RE | Admit: 2019-05-03 | Discharge: 2019-05-03 | Disposition: A | Discharge status: Home / Self Care | Payer: BC Managed Care – PPO | Attending: Nephrology | Admitting: Nephrology

## 2019-05-03 DIAGNOSIS — E1129 Type 2 diabetes mellitus with other diabetic kidney complication: Secondary | ICD-10-CM | POA: Insufficient documentation

## 2019-05-03 DIAGNOSIS — Z789 Other specified health status: Secondary | ICD-10-CM | POA: Insufficient documentation

## 2019-05-03 DIAGNOSIS — Z114 Encounter for screening for human immunodeficiency virus [HIV]: Secondary | ICD-10-CM | POA: Insufficient documentation

## 2019-05-03 DIAGNOSIS — Z940 Kidney transplant status: Secondary | ICD-10-CM | POA: Insufficient documentation

## 2019-05-03 DIAGNOSIS — D899 Disorder involving the immune mechanism, unspecified: Secondary | ICD-10-CM | POA: Diagnosis present

## 2019-05-03 DIAGNOSIS — N390 Urinary tract infection, site not specified: Secondary | ICD-10-CM | POA: Diagnosis not present

## 2019-05-03 DIAGNOSIS — B259 Cytomegaloviral disease, unspecified: Secondary | ICD-10-CM | POA: Insufficient documentation

## 2019-05-03 DIAGNOSIS — D631 Anemia in chronic kidney disease: Secondary | ICD-10-CM | POA: Insufficient documentation

## 2019-05-03 DIAGNOSIS — Z9483 Pancreas transplant status: Secondary | ICD-10-CM | POA: Diagnosis not present

## 2019-05-03 DIAGNOSIS — Z09 Encounter for follow-up examination after completed treatment for conditions other than malignant neoplasm: Secondary | ICD-10-CM | POA: Diagnosis not present

## 2019-05-03 DIAGNOSIS — E559 Vitamin D deficiency, unspecified: Secondary | ICD-10-CM | POA: Insufficient documentation

## 2019-05-03 DIAGNOSIS — Z79899 Other long term (current) drug therapy: Secondary | ICD-10-CM | POA: Insufficient documentation

## 2019-05-03 LAB — MAGNESIUM: Magnesium: 1.8 mg/dL (ref 1.7–2.4)

## 2019-05-03 LAB — CBC WITH DIFFERENTIAL/PLATELET
Abs Immature Granulocytes: 0.01 10*3/uL (ref 0.00–0.07)
Basophils Absolute: 0 10*3/uL (ref 0.0–0.1)
Basophils Relative: 0 %
Eosinophils Absolute: 0.1 10*3/uL (ref 0.0–0.5)
Eosinophils Relative: 2 %
HCT: 33.5 % — ABNORMAL LOW (ref 36.0–46.0)
Hemoglobin: 10.4 g/dL — ABNORMAL LOW (ref 12.0–15.0)
Immature Granulocytes: 0 %
Lymphocytes Relative: 34 %
Lymphs Abs: 1.8 10*3/uL (ref 0.7–4.0)
MCH: 27.2 pg (ref 26.0–34.0)
MCHC: 31 g/dL (ref 30.0–36.0)
MCV: 87.7 fL (ref 80.0–100.0)
Monocytes Absolute: 0.4 10*3/uL (ref 0.1–1.0)
Monocytes Relative: 8 %
Neutro Abs: 3.1 10*3/uL (ref 1.7–7.7)
Neutrophils Relative %: 56 %
Platelets: 183 10*3/uL (ref 150–400)
RBC: 3.82 MIL/uL — ABNORMAL LOW (ref 3.87–5.11)
RDW: 14 % (ref 11.5–15.5)
WBC: 5.5 10*3/uL (ref 4.0–10.5)
nRBC: 0 % (ref 0.0–0.2)

## 2019-05-03 LAB — PHOSPHORUS: Phosphorus: 3.7 mg/dL (ref 2.5–4.6)

## 2019-05-03 LAB — BASIC METABOLIC PANEL
Anion gap: 9 (ref 5–15)
BUN: 36 mg/dL — ABNORMAL HIGH (ref 6–20)
CO2: 24 mmol/L (ref 22–32)
Calcium: 9.1 mg/dL (ref 8.9–10.3)
Chloride: 104 mmol/L (ref 98–111)
Creatinine, Ser: 1.67 mg/dL — ABNORMAL HIGH (ref 0.44–1.00)
GFR calc Af Amer: 40 mL/min — ABNORMAL LOW (ref >60)
GFR calc non Af Amer: 35 mL/min — ABNORMAL LOW (ref >60)
Glucose, Bld: 120 mg/dL — ABNORMAL HIGH (ref 70–99)
Potassium: 4 mmol/L (ref 3.5–5.1)
Sodium: 137 mmol/L (ref 135–145)

## 2019-05-06 LAB — TACROLIMUS LEVEL: Tacrolimus (FK506) - LabCorp: 3.4 ng/mL (ref 2.0–20.0)

## 2019-07-12 ENCOUNTER — Encounter
Admission: RE | Admit: 2019-07-12 | Discharge: 2019-07-12 | Disposition: A | Discharge status: Home / Self Care | Payer: BC Managed Care – PPO | Source: Ambulatory Visit | Attending: Nephrology | Admitting: Nephrology

## 2019-07-12 DIAGNOSIS — N189 Chronic kidney disease, unspecified: Secondary | ICD-10-CM | POA: Insufficient documentation

## 2019-07-12 DIAGNOSIS — D631 Anemia in chronic kidney disease: Secondary | ICD-10-CM | POA: Insufficient documentation

## 2019-07-12 DIAGNOSIS — B259 Cytomegaloviral disease, unspecified: Secondary | ICD-10-CM | POA: Diagnosis not present

## 2019-07-12 DIAGNOSIS — N390 Urinary tract infection, site not specified: Secondary | ICD-10-CM | POA: Diagnosis not present

## 2019-07-12 DIAGNOSIS — Z789 Other specified health status: Secondary | ICD-10-CM | POA: Insufficient documentation

## 2019-07-12 DIAGNOSIS — E559 Vitamin D deficiency, unspecified: Secondary | ICD-10-CM | POA: Diagnosis not present

## 2019-07-12 DIAGNOSIS — E1129 Type 2 diabetes mellitus with other diabetic kidney complication: Secondary | ICD-10-CM | POA: Diagnosis not present

## 2019-07-12 DIAGNOSIS — Z09 Encounter for follow-up examination after completed treatment for conditions other than malignant neoplasm: Secondary | ICD-10-CM | POA: Diagnosis not present

## 2019-07-12 DIAGNOSIS — Z79899 Other long term (current) drug therapy: Secondary | ICD-10-CM | POA: Insufficient documentation

## 2019-07-12 DIAGNOSIS — Z940 Kidney transplant status: Secondary | ICD-10-CM | POA: Diagnosis not present

## 2019-07-12 DIAGNOSIS — D899 Disorder involving the immune mechanism, unspecified: Secondary | ICD-10-CM | POA: Diagnosis present

## 2019-07-12 DIAGNOSIS — Z114 Encounter for screening for human immunodeficiency virus [HIV]: Secondary | ICD-10-CM | POA: Insufficient documentation

## 2019-07-12 DIAGNOSIS — T8610 Unspecified complication of kidney transplant: Secondary | ICD-10-CM | POA: Insufficient documentation

## 2019-07-12 DIAGNOSIS — Z9483 Pancreas transplant status: Secondary | ICD-10-CM | POA: Diagnosis not present

## 2019-07-12 LAB — CBC WITH DIFFERENTIAL/PLATELET
Abs Immature Granulocytes: 0 10*3/uL (ref 0.00–0.07)
Basophils Absolute: 0 10*3/uL (ref 0.0–0.1)
Basophils Relative: 0 %
Eosinophils Absolute: 0.1 10*3/uL (ref 0.0–0.5)
Eosinophils Relative: 2 %
HCT: 34.8 % — ABNORMAL LOW (ref 36.0–46.0)
Hemoglobin: 11 g/dL — ABNORMAL LOW (ref 12.0–15.0)
Immature Granulocytes: 0 %
Lymphocytes Relative: 36 %
Lymphs Abs: 1.7 10*3/uL (ref 0.7–4.0)
MCH: 26.8 pg (ref 26.0–34.0)
MCHC: 31.6 g/dL (ref 30.0–36.0)
MCV: 84.7 fL (ref 80.0–100.0)
Monocytes Absolute: 0.4 10*3/uL (ref 0.1–1.0)
Monocytes Relative: 8 %
Neutro Abs: 2.6 10*3/uL (ref 1.7–7.7)
Neutrophils Relative %: 54 %
Platelets: 164 10*3/uL (ref 150–400)
RBC: 4.11 MIL/uL (ref 3.87–5.11)
RDW: 13.2 % (ref 11.5–15.5)
WBC: 4.8 10*3/uL (ref 4.0–10.5)
nRBC: 0 % (ref 0.0–0.2)

## 2019-07-12 LAB — BASIC METABOLIC PANEL
Anion gap: 11 (ref 5–15)
BUN: 50 mg/dL — ABNORMAL HIGH (ref 6–20)
CO2: 23 mmol/L (ref 22–32)
Calcium: 9.6 mg/dL (ref 8.9–10.3)
Chloride: 103 mmol/L (ref 98–111)
Creatinine, Ser: 2 mg/dL — ABNORMAL HIGH (ref 0.44–1.00)
GFR calc Af Amer: 32 mL/min — ABNORMAL LOW (ref >60)
GFR calc non Af Amer: 28 mL/min — ABNORMAL LOW (ref >60)
Glucose, Bld: 182 mg/dL — ABNORMAL HIGH (ref 70–99)
Potassium: 4.5 mmol/L (ref 3.5–5.1)
Sodium: 137 mmol/L (ref 135–145)

## 2019-07-12 LAB — MAGNESIUM: Magnesium: 1.9 mg/dL (ref 1.7–2.4)

## 2019-07-12 LAB — PHOSPHORUS: Phosphorus: 3.6 mg/dL (ref 2.5–4.6)

## 2019-07-16 LAB — TACROLIMUS LEVEL: Tacrolimus (FK506) - LabCorp: 5.6 ng/mL (ref 2.0–20.0)

## 2019-07-30 ENCOUNTER — Other Ambulatory Visit: Payer: Self-pay | Admitting: Neurology

## 2019-07-30 DIAGNOSIS — Z8673 Personal history of transient ischemic attack (TIA), and cerebral infarction without residual deficits: Secondary | ICD-10-CM

## 2019-08-02 ENCOUNTER — Encounter
Admission: RE | Admit: 2019-08-02 | Discharge: 2019-08-02 | Disposition: A | Discharge status: Home / Self Care | Payer: BC Managed Care – PPO | Source: Ambulatory Visit | Attending: Nephrology | Admitting: Nephrology

## 2019-08-02 DIAGNOSIS — D899 Disorder involving the immune mechanism, unspecified: Secondary | ICD-10-CM | POA: Diagnosis not present

## 2019-08-02 LAB — BASIC METABOLIC PANEL
Anion gap: 11 (ref 5–15)
BUN: 33 mg/dL — ABNORMAL HIGH (ref 6–20)
CO2: 24 mmol/L (ref 22–32)
Calcium: 9.1 mg/dL (ref 8.9–10.3)
Chloride: 99 mmol/L (ref 98–111)
Creatinine, Ser: 1.54 mg/dL — ABNORMAL HIGH (ref 0.44–1.00)
GFR calc Af Amer: 44 mL/min — ABNORMAL LOW (ref >60)
GFR calc non Af Amer: 38 mL/min — ABNORMAL LOW (ref >60)
Glucose, Bld: 116 mg/dL — ABNORMAL HIGH (ref 70–99)
Potassium: 4.6 mmol/L (ref 3.5–5.1)
Sodium: 134 mmol/L — ABNORMAL LOW (ref 135–145)

## 2019-08-02 LAB — CBC WITH DIFFERENTIAL/PLATELET
Abs Immature Granulocytes: 0.01 10*3/uL (ref 0.00–0.07)
Basophils Absolute: 0 10*3/uL (ref 0.0–0.1)
Basophils Relative: 0 %
Eosinophils Absolute: 0.1 10*3/uL (ref 0.0–0.5)
Eosinophils Relative: 3 %
HCT: 30.9 % — ABNORMAL LOW (ref 36.0–46.0)
Hemoglobin: 9.6 g/dL — ABNORMAL LOW (ref 12.0–15.0)
Immature Granulocytes: 0 %
Lymphocytes Relative: 34 %
Lymphs Abs: 1.6 10*3/uL (ref 0.7–4.0)
MCH: 26.7 pg (ref 26.0–34.0)
MCHC: 31.1 g/dL (ref 30.0–36.0)
MCV: 85.8 fL (ref 80.0–100.0)
Monocytes Absolute: 0.4 10*3/uL (ref 0.1–1.0)
Monocytes Relative: 9 %
Neutro Abs: 2.5 10*3/uL (ref 1.7–7.7)
Neutrophils Relative %: 54 %
Platelets: 162 10*3/uL (ref 150–400)
RBC: 3.6 MIL/uL — ABNORMAL LOW (ref 3.87–5.11)
RDW: 13.9 % (ref 11.5–15.5)
WBC: 4.7 10*3/uL (ref 4.0–10.5)
nRBC: 0 % (ref 0.0–0.2)

## 2019-08-02 LAB — PHOSPHORUS: Phosphorus: 3.7 mg/dL (ref 2.5–4.6)

## 2019-08-02 LAB — MAGNESIUM: Magnesium: 1.8 mg/dL (ref 1.7–2.4)

## 2019-08-04 LAB — TACROLIMUS LEVEL: Tacrolimus (FK506) - LabCorp: 5.4 ng/mL (ref 2.0–20.0)

## 2019-08-05 ENCOUNTER — Ambulatory Visit: Payer: Self-pay

## 2019-08-05 ENCOUNTER — Other Ambulatory Visit: Payer: Self-pay

## 2019-08-05 VITALS — BP 138/70 | HR 83 | Resp 16 | Ht 63.0 in | Wt 161.0 lb

## 2019-08-05 DIAGNOSIS — Z23 Encounter for immunization: Secondary | ICD-10-CM

## 2019-08-05 DIAGNOSIS — Z008 Encounter for other general examination: Secondary | ICD-10-CM

## 2019-08-05 LAB — POCT LIPID PANEL
HDL: 79
LDL: 41
Non-HDL: 54
POC Glucose: 113 mg/dl — AB (ref 70–99)
TC/HDL: 1.7
TC: 133
TRG: 63

## 2019-08-05 NOTE — Progress Notes (Signed)
     Patient ID: Brandi Carroll, female    DOB: [birth year], 53 y.o.   MRN: [id number]    Thank you!!  Apolonio Schneiders RN  [place] [facility] Specialist [place]: [phone]  Cell:  [phone] Website: [email or web address]

## 2019-08-05 NOTE — Patient Instructions (Signed)
Influenza (Flu) Vaccine (Inactivated or Recombinant): What You Need to Know 1. Why get vaccinated? Influenza vaccine can prevent influenza (flu). Flu is a contagious disease that spreads around the [place] every year, usually between October and May. Anyone can get the flu, but it is more dangerous for some people. Infants and young children, people 53 years of age and older, pregnant women, and people with certain health conditions or a weakened immune system are at greatest risk of flu complications. Pneumonia, bronchitis, sinus infections and ear infections are examples of flu-related complications. If you have a medical condition, such as heart disease, cancer or diabetes, flu can make it worse. Flu can cause fever and chills, sore throat, muscle aches, fatigue, cough, headache, and runny or stuffy nose. Some people may have vomiting and diarrhea, though this is more common in children than adults. Each year thousands of people in the [place] die from flu, and many more are hospitalized. Flu vaccine prevents millions of illnesses and flu-related visits to the doctor each year. 2. Influenza vaccine CDC recommends everyone 6 months of age and older get vaccinated every flu season. Children 6 months through 8 years of age may need 2 doses during a single flu season. Everyone else needs only 1 dose each flu season. It takes about 2 weeks for protection to develop after vaccination. There are many flu viruses, and they are always changing. Each year a new flu vaccine is made to protect against three or four viruses that are likely to cause disease in the upcoming flu season. Even when the vaccine doesn't exactly match these viruses, it may still provide some protection. Influenza vaccine does not cause flu. Influenza vaccine may be given at the same time as other vaccines. 3. Talk with your health care provider Tell your vaccine provider if the person getting the vaccine:  Has had an  allergic reaction after a previous dose of influenza vaccine, or has any severe, life-threatening allergies.  Has ever had Guillain-Barr Syndrome (also called GBS). In some cases, your health care provider may decide to postpone influenza vaccination to a future visit. People with minor illnesses, such as a cold, may be vaccinated. People who are moderately or severely ill should usually wait until they recover before getting influenza vaccine. Your health care provider can give you more information. 4. Risks of a vaccine reaction  Soreness, redness, and swelling where shot is given, fever, muscle aches, and headache can happen after influenza vaccine.  There may be a very small increased risk of Guillain-Barr Syndrome (GBS) after inactivated influenza vaccine (the flu shot). Young children who get the flu shot along with pneumococcal vaccine (PCV13), and/or DTaP vaccine at the same time might be slightly more likely to have a seizure caused by fever. Tell your health care provider if a child who is getting flu vaccine has ever had a seizure. People sometimes faint after medical procedures, including vaccination. Tell your provider if you feel dizzy or have vision changes or ringing in the ears. As with any medicine, there is a very remote chance of a vaccine causing a severe allergic reaction, other serious injury, or death. 5. What if there is a serious problem? An allergic reaction could occur after the vaccinated person leaves the clinic. If you see signs of a severe allergic reaction (hives, swelling of the face and throat, difficulty breathing, a fast heartbeat, dizziness, or weakness), call 9-1-1 and get the person to the nearest hospital. For other signs that   concern you, call your health care provider. Adverse reactions should be reported to the Vaccine Adverse Event Reporting System (VAERS). Your health care provider will usually file this report, or you can do it yourself. Visit the  VAERS website at www.vaers.hhs.gov or call 1-800-822-7967.VAERS is only for reporting reactions, and VAERS staff do not give medical advice. 6. The National Vaccine Injury Compensation Program The National Vaccine Injury Compensation Program (VICP) is a federal program that was created to compensate people who may have been injured by certain vaccines. Visit the VICP website at www.hrsa.gov/vaccinecompensation or call 1-800-338-2382 to learn about the program and about filing a claim. There is a time limit to file a claim for compensation. 7. How can I learn more?  Ask your healthcare provider.  Call your local or state health department.  Contact the Centers for Disease Control and Prevention (CDC): ? Call 1-800-232-4636 (1-800-CDC-INFO) or ? Visit CDC's www.cdc.gov/flu Vaccine Information Statement (Interim) Inactivated Influenza Vaccine (06/20/2018) This information is not intended to replace advice given to you by your health care provider. Make sure you discuss any questions you have with your health care provider. Document Released: 08/17/2006 Document Revised: 02/11/2019 Document Reviewed: 06/24/2018 Elsevier Patient Education  2020 Elsevier Inc. Preventing Influenza, Adult Influenza, more commonly known as "the flu," is a viral infection that mainly affects the respiratory tract. The respiratory tract includes structures that help you breathe, such as the lungs, nose, and throat. The flu causes many common cold symptoms, as well as a high fever and body aches. The flu spreads easily from person to person (is contagious). The flu is most common from December through March. This is called flu season.You can catch the flu virus by:  Breathing in droplets from an infected person's cough or sneeze.  Touching something that was recently contaminated with the virus and then touching your mouth, nose, or eyes. What can I do to lower my risk?        You can decrease your risk of getting  the flu by:  Getting a flu shot (influenza vaccination) every year. This is the best way to prevent the flu. A flu shot is recommended for everyone age 6 months and older. ? It is best to get a flu shot in the fall, as soon as it is available. Getting a flu shot during winter or spring instead is still a good idea. Flu season can last into early spring. ? Preventing the flu through vaccination requires getting a new flu shot every year. This is because the flu virus changes slightly (mutates) from one year to the next. Even if a flu shot does not completely protect you from all flu virus mutations, it can reduce the severity of your illness and prevent dangerous complications of the flu. ? If you are pregnant, you can and should get a flu shot. ? If you have had a reaction to the shot in the past or if you are allergic to eggs, check with your health care provider before getting a flu shot. ? Sometimes the vaccine is available as a nasal spray. In some years, the nasal spray has not been as effective against the flu virus. Check with your health care provider if you have questions about this.  Practicing good health habits. This is especially important during flu season. ? Avoid contact with people who are sick with flu or cold symptoms. ? Wash your hands with soap and water often. If soap and water are not available, use alcohol-based   hand sanitizer. ? Avoid touching your hands to your face, especially when you have not washed your hands recently. ? Use a disinfectant to clean surfaces at home and at work that may be contaminated with the flu virus. ? Keep your body's disease-fighting system (immune system) in good shape by eating a healthy diet, drinking plenty of fluids, getting enough sleep, and exercising regularly. If you do get the flu, avoid spreading it to others by:  Staying home until your symptoms have been gone for at least one day.  Covering your mouth and nose when you cough or  sneeze.  Avoiding close contact with others, especially babies and elderly people. Why are these changes important? Getting a flu shot and practicing good health habits protects you as well as other people. If you get the flu, your friends, family, and co-workers are also at risk of getting it, because it spreads so easily to others. Each year, about 2 out of every 10 people get the flu. Having the flu can lead to complications, such as pneumonia, ear infection, and sinus infection. The flu also can be deadly, especially for babies, people older than age 53, and people who have serious long-term diseases. How is this treated? Most people recover from the flu by resting at home and drinking plenty of fluids. However, a prescription antiviral medicine may reduce your flu symptoms and may make your flu go away sooner. This medicine must be started within a few days of getting flu symptoms. You can talk with your health care provider about whether you need an antiviral medicine. Antiviral medicine may be prescribed for people who are at risk for more serious flu symptoms. This includes people who:  Are older than age 53.  Are pregnant.  Have a condition that makes the flu worse or more dangerous. Where to find more information  Centers for Disease Control and Prevention: www.cdc.gov/flu/index.htm  Flu.gov: www.flu.gov/prevention-vaccination  American Academy of [employer]icians: familydoctor.org/familydoctor/en/kids/vaccines/preventing-the-flu.html Contact a health care provider if:  You have influenza and you develop new symptoms.  You have: ? Chest pain. ? Diarrhea. ? A fever.  Your cough gets worse, or you produce more mucus. Summary  The best way to prevent the flu is to get a flu shot every year in the fall.  Even if you get the flu after you have received the yearly vaccine, your flu may be milder and go away sooner because of your flu shot.  If you get the flu, antiviral  medicines that are started with a few days of symptoms may reduce your flu symptoms and may make your flu go away sooner.  You can also help prevent the flu by practicing good health habits. This information is not intended to replace advice given to you by your health care provider. Make sure you discuss any questions you have with your health care provider. Document Released: 11/07/2015 Document Revised: 10/05/2017 Document Reviewed: 07/01/2016 Elsevier Patient Education  2020 Elsevier Inc.  

## 2019-08-10 ENCOUNTER — Ambulatory Visit: Payer: BC Managed Care – PPO

## 2019-08-19 ENCOUNTER — Other Ambulatory Visit: Payer: Self-pay

## 2019-08-19 ENCOUNTER — Ambulatory Visit
Admission: RE | Admit: 2019-08-19 | Discharge: 2019-08-19 | Disposition: A | Discharge status: Home / Self Care | Payer: BC Managed Care – PPO | Source: Ambulatory Visit | Attending: Neurology | Admitting: Neurology

## 2019-08-19 DIAGNOSIS — Z8673 Personal history of transient ischemic attack (TIA), and cerebral infarction without residual deficits: Secondary | ICD-10-CM | POA: Insufficient documentation

## 2019-09-06 ENCOUNTER — Encounter
Admission: RE | Admit: 2019-09-06 | Discharge: 2019-09-06 | Disposition: A | Discharge status: Home / Self Care | Payer: BC Managed Care – PPO | Source: Ambulatory Visit | Attending: Nephrology | Admitting: Nephrology

## 2019-09-06 DIAGNOSIS — E1129 Type 2 diabetes mellitus with other diabetic kidney complication: Secondary | ICD-10-CM | POA: Insufficient documentation

## 2019-09-06 DIAGNOSIS — Z789 Other specified health status: Secondary | ICD-10-CM | POA: Diagnosis not present

## 2019-09-06 DIAGNOSIS — Z9483 Pancreas transplant status: Secondary | ICD-10-CM | POA: Insufficient documentation

## 2019-09-06 DIAGNOSIS — Z09 Encounter for follow-up examination after completed treatment for conditions other than malignant neoplasm: Secondary | ICD-10-CM | POA: Diagnosis not present

## 2019-09-06 DIAGNOSIS — Z79899 Other long term (current) drug therapy: Secondary | ICD-10-CM | POA: Insufficient documentation

## 2019-09-06 DIAGNOSIS — N189 Chronic kidney disease, unspecified: Secondary | ICD-10-CM | POA: Diagnosis not present

## 2019-09-06 DIAGNOSIS — Z940 Kidney transplant status: Secondary | ICD-10-CM | POA: Diagnosis not present

## 2019-09-06 DIAGNOSIS — D631 Anemia in chronic kidney disease: Secondary | ICD-10-CM | POA: Insufficient documentation

## 2019-09-06 DIAGNOSIS — N390 Urinary tract infection, site not specified: Secondary | ICD-10-CM | POA: Insufficient documentation

## 2019-09-06 DIAGNOSIS — B259 Cytomegaloviral disease, unspecified: Secondary | ICD-10-CM | POA: Diagnosis not present

## 2019-09-06 DIAGNOSIS — Z114 Encounter for screening for human immunodeficiency virus [HIV]: Secondary | ICD-10-CM | POA: Insufficient documentation

## 2019-09-06 DIAGNOSIS — E559 Vitamin D deficiency, unspecified: Secondary | ICD-10-CM | POA: Insufficient documentation

## 2019-09-06 DIAGNOSIS — D899 Disorder involving the immune mechanism, unspecified: Secondary | ICD-10-CM | POA: Insufficient documentation

## 2019-09-06 LAB — BASIC METABOLIC PANEL
Anion gap: 11 (ref 5–15)
BUN: 40 mg/dL — ABNORMAL HIGH (ref 6–20)
CO2: 27 mmol/L (ref 22–32)
Calcium: 9.4 mg/dL (ref 8.9–10.3)
Chloride: 100 mmol/L (ref 98–111)
Creatinine, Ser: 1.73 mg/dL — ABNORMAL HIGH (ref 0.44–1.00)
GFR calc Af Amer: 38 mL/min — ABNORMAL LOW (ref >60)
GFR calc non Af Amer: 33 mL/min — ABNORMAL LOW (ref >60)
Glucose, Bld: 64 mg/dL — ABNORMAL LOW (ref 70–99)
Potassium: 4.6 mmol/L (ref 3.5–5.1)
Sodium: 138 mmol/L (ref 135–145)

## 2019-09-06 LAB — PHOSPHORUS: Phosphorus: 4.2 mg/dL (ref 2.5–4.6)

## 2019-09-06 LAB — CBC WITH DIFFERENTIAL/PLATELET
Abs Immature Granulocytes: 0.01 10*3/uL (ref 0.00–0.07)
Basophils Absolute: 0 10*3/uL (ref 0.0–0.1)
Basophils Relative: 0 %
Eosinophils Absolute: 0.1 10*3/uL (ref 0.0–0.5)
Eosinophils Relative: 2 %
HCT: 35.2 % — ABNORMAL LOW (ref 36.0–46.0)
Hemoglobin: 11.1 g/dL — ABNORMAL LOW (ref 12.0–15.0)
Immature Granulocytes: 0 %
Lymphocytes Relative: 34 %
Lymphs Abs: 1.8 10*3/uL (ref 0.7–4.0)
MCH: 27.3 pg (ref 26.0–34.0)
MCHC: 31.5 g/dL (ref 30.0–36.0)
MCV: 86.5 fL (ref 80.0–100.0)
Monocytes Absolute: 0.5 10*3/uL (ref 0.1–1.0)
Monocytes Relative: 9 %
Neutro Abs: 3 10*3/uL (ref 1.7–7.7)
Neutrophils Relative %: 55 %
Platelets: 184 10*3/uL (ref 150–400)
RBC: 4.07 MIL/uL (ref 3.87–5.11)
RDW: 14.5 % (ref 11.5–15.5)
WBC: 5.5 10*3/uL (ref 4.0–10.5)
nRBC: 0 % (ref 0.0–0.2)

## 2019-09-06 LAB — MAGNESIUM: Magnesium: 1.6 mg/dL — ABNORMAL LOW (ref 1.7–2.4)

## 2019-09-09 LAB — TACROLIMUS LEVEL: Tacrolimus (FK506) - LabCorp: 4 ng/mL (ref 2.0–20.0)

## 2019-09-16 ENCOUNTER — Ambulatory Visit: Payer: BC Managed Care – PPO | Attending: Neurology | Admitting: Speech Pathology

## 2019-09-16 ENCOUNTER — Other Ambulatory Visit: Payer: Self-pay

## 2019-09-16 DIAGNOSIS — R471 Dysarthria and anarthria: Secondary | ICD-10-CM | POA: Diagnosis not present

## 2019-09-16 DIAGNOSIS — R41841 Cognitive communication deficit: Secondary | ICD-10-CM | POA: Diagnosis present

## 2019-09-17 ENCOUNTER — Ambulatory Visit: Payer: BC Managed Care – PPO | Admitting: Speech Pathology

## 2019-09-17 ENCOUNTER — Encounter: Payer: Self-pay | Admitting: Speech Pathology

## 2019-09-17 ENCOUNTER — Other Ambulatory Visit: Payer: Self-pay

## 2019-09-17 NOTE — Therapy (Signed)
[place] MAIN [facility] SERVICES [address] [place], [place], [address] Phone: [phone]   Fax:  [phone]  Speech Language Pathology Evaluation  Patient Details  Name: Brandi Carroll MRN: [id number] Date of Birth: Jan 12, 1966 Referring Provider (SLP): Dr. Manuella Ghazi   Encounter Date: 09/16/2019  End of Session - 09/17/19 1416    Visit Number  1    Number of Visits  5    Date for SLP Re-Evaluation  10/14/19    Authorization Type  [employer] cross    Authorization Time Period  Start 09/16/2019    SLP Start Time  1610    SLP Stop Time   1700    SLP Time Calculation (min)  50 min    Activity Tolerance  Patient tolerated treatment well       Past Medical History:  Diagnosis Date  . Chronic kidney disease   . Chronic sinusitis   . 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 Diabetic neuropathy ([place])   . GERD (gastroesophageal reflux disease)   . Hyperlipemia   . Hypertension    reports it goes either too high or too low; taking lisinopril but hasn't been regulated.  . Hyperthyroidism   . Insulin pump in place   . Nausea and vomiting   . Obesity   . Osteoporosis   . Stroke ([place])   . Tremor     Past Surgical History:  Procedure Laterality Date  . CATARACT EXTRACTION    . COLONOSCOPY WITH PROPOFOL N/A 10/19/2016   Procedure: COLONOSCOPY WITH PROPOFOL;  Surgeon: Lollie Sails, MD;  Location: [facility] ENDOSCOPY;  Service: Endoscopy;  Laterality: N/A;  . EYE SURGERY     bilateral (cataracts)  . FOOT SURGERY    . FOOT SURGERY    . KIDNEY TRANSPLANT  2007    There were no vitals filed for this visit.      SLP Evaluation [facility] - 09/17/19 0001      SLP Visit Information   SLP Received On  09/16/19    Referring Provider (SLP)  Dr. Manuella Ghazi    Onset Date  08/07/2019    Medical Diagnosis  Memory loss      Subjective   Subjective  The patient was  alert, pleasant, and cooperative throughout the speech therapy evaluation. She conversed easily about her vocational responsibilities, her medical history, and her reasons for seeking a cognitive-communication evaluation.     Patient/Family Stated Goal  The patient reports noticing a "decline in her ability to pronounce words accurately" associated with left sided facial numbness/reduced sensation since February 2020. She reports that she favors the right side of her oral cavity for mastication due to this left sided facial numbness. Patient also states "Everything is rushed for me now. I feel like I'm slower with processing things than I used to be." Patient sometimes has difficulty remembering if she has taken her medications. Patient desires a cognitive-communication evaluation as part of a proactive and comprehensive approach to her medical care management.       General Information   HPI  Brandi Carroll is a 53 year old female referred for ST evaluation to assess need for/potential benefit of cognitive training. Patient has history of pontine and cerebellar infarcts in 2016 and left side Bell's palsy in 2017. Brain MRI without contrast taken on 08/20/2019 showed advanced atrophy and moderate chronic small vessel disease with no acute intracranial  abnormality. Patient experienced sudden onset of left sided facial numbness/reduced sensation in February 2020, which she reports has improved since that time. Patient completed neuropsychological testing on 09/12/2019 and has appointment scheduled to review those results on 09/23/2019. Patient has not received prior SLP treatment.       Prior Functional Status   Cognitive/Linguistic Baseline  Within functional limits      Cognition   Overall Cognitive Status  Within Functional Limits for tasks assessed      Auditory Comprehension   Overall Auditory Comprehension  Appears within functional limits for tasks assessed      Reading Comprehension   Reading  Status  Within funtional limits      Expression   Primary Mode of Expression  Verbal      Written Expression   Dominant Hand  Right    Written Expression  Within Functional Limits      Oral Motor/Sensory Function   Overall Oral Motor/Sensory Function  Impaired      Motor Speech   Overall Motor Speech  Impaired       Motor Speech Evaluation Face: Minimal right facial droop Lips: Mildly reduced ROM and strength on left side; mildly decreased planning/coordination for rapid alternating movements Tongue: Mild right deviation with protrusion; mild left deviation with curl Jaw: Within normal limits Soft palate: Within normal limits Oral agility: Mildly decreased planning/coordination for rapid alternating movements.  Sensory: Patient reports mild left facial numbness Voice: Within normal limits  Respiration: Within normal limits Resonance: Within normal limits Fluency: Within normal limits Intelligibility: Intelligible  Observations: Patient's cognitive-communication abilities appear within functional limits. No negative impact of left sided facial numbness/reduced sensation noted on patient's overall speech intelligibility. Patient describes functional compensatory strategies she has developed independently to manage short-term recall difficulties impacting her medication management. 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    SLP Education - 09/17/19 1415    Education Details  Results and recommendations    Person(s) Educated  Patient    Methods  Explanation    Comprehension  Verbalized understanding         SLP Long Term Goals - 09/17/19 1420      SLP LONG TERM GOAL #1   Title  Patient will independently execute Oral Motor/Motor Speech HEP.    Time  4    Period  Weeks    Status  New    Target Date  10/14/19      SLP LONG TERM GOAL #2   Title  Patient will identify  cognitive-communication barriers and participate in developing functional compensatory strategies.    Time  4    Period  Weeks    Status  New    Target Date  10/14/19       Plan - 09/17/19 1418    Clinical Impression Statement  : Patient presents with mild oral motor deficits and cognitive-communication concerns which she describes as being characterized by slow information processing, word retrieval difficulties, and reduced short-term recall. Patient's cognitive-communication abilities are clinically judged to be within functional limits for the tasks assessed during this evaluation. Patient's speech is 100% intelligible, and she has independently developed functional compensatory strategies for addressing medication management issues impacted by her short-term recall challenges. It is recommended that patient return for skilled ST treatment after receiving the results of her recent neuropsychological evaluation to address any cognitive training needs indicated by those results and for provision  of a home exercise program to address oral motor deficits.    Speech Therapy Frequency  1x /week    Duration  4 weeks    Treatment/Interventions  Patient/family education;Oral motor exercises;Compensatory strategies    Potential to Achieve Goals  Good    Potential Considerations  Ability to learn/carryover information;Previous level of function;Co-morbidities;Severity of impairments;Cooperation/participation level;Medical prognosis;Family/community support    SLP Home Exercise Plan  TBD    Consulted and Agree with Plan of Care  Patient       Patient will benefit from skilled therapeutic intervention in order to improve the following deficits and impairments:   Dysarthria and anarthria - Plan: SLP plan of care cert/re-cert  Cognitive communication deficit - Plan: SLP plan of care cert/re-cert    Problem List There are no active problems to display for this patient.  Leroy Sea, MS/CCC-  SLP  Lou Miner 09/17/2019, 2:25 PM  [place] MAIN [facility] SERVICES [address] [place], [place], [address] Phone: [phone]   Fax:  [phone]  Name: Brandi Carroll MRN: [id number] Date of Birth: 22-Aug-1966

## 2019-09-23 ENCOUNTER — Encounter: Payer: BC Managed Care – PPO | Admitting: Speech Pathology

## 2019-09-30 ENCOUNTER — Encounter: Payer: BC Managed Care – PPO | Admitting: Speech Pathology

## 2019-10-01 ENCOUNTER — Ambulatory Visit: Payer: BC Managed Care – PPO | Admitting: Speech Pathology

## 2019-10-01 ENCOUNTER — Encounter: Payer: Self-pay | Admitting: Speech Pathology

## 2019-10-01 ENCOUNTER — Other Ambulatory Visit: Payer: Self-pay

## 2019-10-01 DIAGNOSIS — R41841 Cognitive communication deficit: Secondary | ICD-10-CM

## 2019-10-01 DIAGNOSIS — R471 Dysarthria and anarthria: Secondary | ICD-10-CM | POA: Diagnosis not present

## 2019-10-01 NOTE — Therapy (Signed)
[place] MAIN [facility] SERVICES [address] [place], [place], [address] Phone: [phone]   Fax:  [phone]  Speech Language Pathology Treatment  Patient Details  Name: LENNON RICHINS MRN: [id number] Date of Birth: Nov 05, 1966 Referring Provider (SLP): Dr. Manuella Ghazi   Encounter Date: 10/01/2019  End of Session - 10/01/19 1649    Visit Number  2    Number of Visits  5    Date for SLP Re-Evaluation  10/14/19    Authorization Type  [employer] cross    Authorization Time Period  Start 09/16/2019    Authorization - Visit Number  2    SLP Start Time  3149    SLP Stop Time   7026    SLP Time Calculation (min)  55 min    Activity Tolerance  Patient tolerated treatment well       Past Medical History:  Diagnosis Date  . Chronic kidney disease   . Chronic sinusitis   . Diabetes mellitus without complication (HCC)    V7C, 7.0 last check; type 1, has insulin pump; subsequently has neuropathy;   . Diabetic neuropathy ([place])   . GERD (gastroesophageal reflux disease)   . Hyperlipemia   . Hypertension    reports it goes either too high or too low; taking lisinopril but hasn't been regulated.  . Hyperthyroidism   . Insulin pump in place   . Nausea and vomiting   . Obesity   . Osteoporosis   . Stroke ([place])   . Tremor     Past Surgical History:  Procedure Laterality Date  . CATARACT EXTRACTION    . COLONOSCOPY WITH PROPOFOL N/A 10/19/2016   Procedure: COLONOSCOPY WITH PROPOFOL;  Surgeon: Lollie Sails, MD;  Location: [facility] ENDOSCOPY;  Service: Endoscopy;  Laterality: N/A;  . EYE SURGERY     bilateral (cataracts)  . FOOT SURGERY    . FOOT SURGERY    . KIDNEY TRANSPLANT  2007    There were no vitals filed for this visit.  Subjective Assessment - 10/01/19 1647    Subjective  Patient reports that neuropsychology reports information processing to be impaired            ADULT SLP TREATMENT - 10/01/19 0001      General Information    Behavior/Cognition  Alert;Cooperative;Pleasant mood    HPI  Ahriyah Vannest is a 53 year old female referred for ST evaluation to assess need for/potential benefit of cognitive training. Patient has history of pontine and cerebellar infarcts in 2016 and left side Bell's palsy in 2017. Brain MRI without contrast taken on 08/20/2019 showed advanced atrophy and moderate chronic small vessel disease with no acute intracranial abnormality. Patient experienced sudden onset of left sided facial numbness/reduced sensation in February 2020, which she reports has improved since that time. Patient completed neuropsychological testing on 09/12/2019 and has appointment scheduled to review those results on 09/23/2019. Patient has not received prior SLP treatment.        Treatment Provided   Treatment provided  Cognitive-Linquistic      Pain Assessment   Pain Assessment  No/denies pain      Cognitive-Linquistic Treatment   Treatment focused on  Cognition    Skilled Treatment  Oral Motor/Motor Speech: Patient provided with oral motor exercises and words/phrases to maintain and improve labial range, strength, and coordination.  Cognition:  Patient provided with written strategies for functional memory, organization, and problems solving.  The patient reports that she had a telemedicine visit with  the neuropsychologist and he reported that a primary cognitive impairment is information processing.      Assessment / Recommendations / Plan   Plan  Continue with current plan of care      Progression Toward Goals   Progression toward goals  Progressing toward goals       SLP Education - 10/01/19 1648    Education Details  Speech exercises/memory tips/problem solving    Person(s) Educated  Patient    Methods  Explanation;Handout    Comprehension  Verbalized understanding         SLP Long Term Goals - 09/17/19 1420      SLP LONG TERM GOAL #1   Title  Patient will independently execute Oral Motor/Motor  Speech HEP.    Time  4    Period  Weeks    Status  New    Target Date  10/14/19      SLP LONG TERM GOAL #2   Title  Patient will identify cognitive-communication barriers and participate in developing functional compensatory strategies.    Time  4    Period  Weeks    Status  New    Target Date  10/14/19       Plan - 10/01/19 1650    Clinical Impression Statement  The patient has been given speech exercises to maintain and improve labial range, strength, and coordination.  She has information to begin determining best strategies for functional cognitive compensation.  Will plan to work on information processing in future sessions.    Speech Therapy Frequency  1x /week    Duration  4 weeks    Treatment/Interventions  Patient/family education;Oral motor exercises;Compensatory strategies    Potential to Achieve Goals  Good    Potential Considerations  Ability to learn/carryover information;Previous level of function;Co-morbidities;Severity of impairments;Cooperation/participation level;Medical prognosis;Family/community support    SLP Home Exercise Plan  Provided    Consulted and Agree with Plan of Care  Patient       Patient will benefit from skilled therapeutic intervention in order to improve the following deficits and impairments:   Cognitive communication deficit  Dysarthria and anarthria    Problem List There are no active problems to display for this patient.  Leroy Sea, MS/CCC- SLP  Lou Miner 10/01/2019, 4:51 PM  [place] MAIN [facility] SERVICES [address] [place], [place], [address] Phone: [phone]   Fax:  [phone]   Name: SAYLAH KETNER MRN: [id number] Date of Birth: 08/01/1966

## 2019-10-07 ENCOUNTER — Encounter: Payer: BC Managed Care – PPO | Admitting: Speech Pathology

## 2019-10-08 ENCOUNTER — Encounter: Payer: BC Managed Care – PPO | Admitting: Speech Pathology

## 2019-10-09 ENCOUNTER — Encounter: Payer: BC Managed Care – PPO | Admitting: Speech Pathology

## 2019-10-10 ENCOUNTER — Encounter: Payer: BC Managed Care – PPO | Admitting: Speech Pathology

## 2019-10-14 ENCOUNTER — Encounter: Payer: BC Managed Care – PPO | Admitting: Speech Pathology

## 2019-10-16 ENCOUNTER — Other Ambulatory Visit: Payer: Self-pay

## 2019-10-16 ENCOUNTER — Encounter: Payer: BC Managed Care – PPO | Admitting: Speech Pathology

## 2019-10-16 ENCOUNTER — Ambulatory Visit: Payer: BC Managed Care – PPO | Attending: Neurology | Admitting: Speech Pathology

## 2019-10-16 DIAGNOSIS — R41841 Cognitive communication deficit: Secondary | ICD-10-CM | POA: Diagnosis present

## 2019-10-16 DIAGNOSIS — R471 Dysarthria and anarthria: Secondary | ICD-10-CM | POA: Diagnosis present

## 2019-10-17 ENCOUNTER — Encounter: Payer: Self-pay | Admitting: Speech Pathology

## 2019-10-17 NOTE — Therapy (Signed)
[place] MAIN [facility] SERVICES [address] [place], [place], [address] Phone: [phone]   Fax:  [phone]  Speech Language Pathology Treatment  Patient Details  Name: Brandi Carroll MRN: [id number] Date of Birth: 02-15-1966 Referring Provider (SLP): Dr. Manuella Ghazi   Encounter Date: 10/16/2019  End of Session - 10/17/19 1544    Visit Number  3    Number of Visits  5    Date for SLP Re-Evaluation  10/14/19    Authorization Type  [employer] cross    Authorization Time Period  Start 09/16/2019    Authorization - Visit Number  3    Authorization - Number of Visits  10    SLP Start Time  3976    SLP Stop Time   1700    SLP Time Calculation (min)  45 min    Activity Tolerance  Patient tolerated treatment well       Past Medical History:  Diagnosis Date  . Chronic kidney disease   . Chronic sinusitis   . Diabetes mellitus without complication (HCC)    B3A, 7.0 last check; type 1, has insulin pump; subsequently has neuropathy;   . Diabetic neuropathy ([place])   . GERD (gastroesophageal reflux disease)   . Hyperlipemia   . Hypertension    reports it goes either too high or too low; taking lisinopril but hasn't been regulated.  . Hyperthyroidism   . Insulin pump in place   . Nausea and vomiting   . Obesity   . Osteoporosis   . Stroke ([place])   . Tremor     Past Surgical History:  Procedure Laterality Date  . CATARACT EXTRACTION    . COLONOSCOPY WITH PROPOFOL N/A 10/19/2016   Procedure: COLONOSCOPY WITH PROPOFOL;  Surgeon: Lollie Sails, MD;  Location: [facility] ENDOSCOPY;  Service: Endoscopy;  Laterality: N/A;  . EYE SURGERY     bilateral (cataracts)  . FOOT SURGERY    . FOOT SURGERY    . KIDNEY TRANSPLANT  2007    There were no vitals filed for this visit.  Subjective Assessment - 10/17/19 1543    Subjective  Patient discussed multiple problems at work that she attributes to her cognitive changes              SLP  Education - 10/17/19 1544    Education Details  speech exercises/problem solving/information processing    Person(s) Educated  Patient    Methods  Explanation    Comprehension  Verbalized understanding         SLP Long Term Goals - 09/17/19 1420      SLP LONG TERM GOAL #1   Title  Patient will independently execute Oral Motor/Motor Speech HEP.    Time  4    Period  Weeks    Status  New    Target Date  10/14/19      SLP LONG TERM GOAL #2   Title  Patient will identify cognitive-communication barriers and participate in developing functional compensatory strategies.    Time  4    Period  Weeks    Status  New    Target Date  10/14/19       Plan - 10/17/19 1545    Clinical Impression Statement  The patient has been given speech exercises to maintain and improve labial range, strength, and coordination; theses have been reviewed and revised.  She has information to begin determining best strategies for functional cognitive compensation; she has begun.  Will plan to work on information processing in future sessions; she has exercises from the [employer] program (Information Processing).    Speech Therapy Frequency  1x /week    Duration  4 weeks    Treatment/Interventions  Patient/family education;Oral motor exercises;Compensatory strategies    Potential to Achieve Goals  Good    Potential Considerations  Ability to learn/carryover information;Previous level of function;Co-morbidities;Severity of impairments;Cooperation/participation level;Medical prognosis;Family/community support    SLP Home Exercise Plan  Provided    Consulted and Agree with Plan of Care  Patient       Patient will benefit from skilled therapeutic intervention in order to improve the following deficits and impairments:   Cognitive communication deficit  Dysarthria and anarthria    Problem List There are no problems to display for this patient.  Leroy Sea, [place],  [name] 10/17/2019, 3:47 PM  [place] MAIN [facility] SERVICES [address] [place], [place], [address] Phone: [phone]   Fax:  [phone]   Name: Brandi Carroll MRN: [id number] Date of Birth: 11-Feb-1966

## 2019-10-18 ENCOUNTER — Other Ambulatory Visit: Payer: Self-pay

## 2019-10-18 ENCOUNTER — Encounter
Admission: RE | Admit: 2019-10-18 | Discharge: 2019-10-18 | Disposition: A | Discharge status: Home / Self Care | Payer: BC Managed Care – PPO | Source: Ambulatory Visit | Attending: Nephrology | Admitting: Nephrology

## 2019-10-18 DIAGNOSIS — E1129 Type 2 diabetes mellitus with other diabetic kidney complication: Secondary | ICD-10-CM | POA: Insufficient documentation

## 2019-10-18 DIAGNOSIS — D631 Anemia in chronic kidney disease: Secondary | ICD-10-CM | POA: Insufficient documentation

## 2019-10-18 DIAGNOSIS — Z789 Other specified health status: Secondary | ICD-10-CM | POA: Diagnosis not present

## 2019-10-18 DIAGNOSIS — E559 Vitamin D deficiency, unspecified: Secondary | ICD-10-CM | POA: Insufficient documentation

## 2019-10-18 DIAGNOSIS — B259 Cytomegaloviral disease, unspecified: Secondary | ICD-10-CM | POA: Diagnosis not present

## 2019-10-18 DIAGNOSIS — T8610 Unspecified complication of kidney transplant: Secondary | ICD-10-CM | POA: Insufficient documentation

## 2019-10-18 DIAGNOSIS — N390 Urinary tract infection, site not specified: Secondary | ICD-10-CM | POA: Diagnosis not present

## 2019-10-18 DIAGNOSIS — Z09 Encounter for follow-up examination after completed treatment for conditions other than malignant neoplasm: Secondary | ICD-10-CM | POA: Diagnosis not present

## 2019-10-18 DIAGNOSIS — D899 Disorder involving the immune mechanism, unspecified: Secondary | ICD-10-CM | POA: Insufficient documentation

## 2019-10-18 DIAGNOSIS — Z114 Encounter for screening for human immunodeficiency virus [HIV]: Secondary | ICD-10-CM | POA: Diagnosis not present

## 2019-10-18 DIAGNOSIS — Z9483 Pancreas transplant status: Secondary | ICD-10-CM | POA: Diagnosis not present

## 2019-10-18 DIAGNOSIS — N189 Chronic kidney disease, unspecified: Secondary | ICD-10-CM | POA: Diagnosis not present

## 2019-10-18 DIAGNOSIS — Z79899 Other long term (current) drug therapy: Secondary | ICD-10-CM | POA: Insufficient documentation

## 2019-10-18 DIAGNOSIS — Z940 Kidney transplant status: Secondary | ICD-10-CM | POA: Diagnosis not present

## 2019-10-18 LAB — CBC WITH DIFFERENTIAL/PLATELET
Abs Immature Granulocytes: 0.01 10*3/uL (ref 0.00–0.07)
Basophils Absolute: 0 10*3/uL (ref 0.0–0.1)
Basophils Relative: 1 %
Eosinophils Absolute: 0.1 10*3/uL (ref 0.0–0.5)
Eosinophils Relative: 2 %
HCT: 34.4 % — ABNORMAL LOW (ref 36.0–46.0)
Hemoglobin: 10.8 g/dL — ABNORMAL LOW (ref 12.0–15.0)
Immature Granulocytes: 0 %
Lymphocytes Relative: 26 %
Lymphs Abs: 1.5 10*3/uL (ref 0.7–4.0)
MCH: 27.5 pg (ref 26.0–34.0)
MCHC: 31.4 g/dL (ref 30.0–36.0)
MCV: 87.5 fL (ref 80.0–100.0)
Monocytes Absolute: 0.4 10*3/uL (ref 0.1–1.0)
Monocytes Relative: 7 %
Neutro Abs: 3.7 10*3/uL (ref 1.7–7.7)
Neutrophils Relative %: 64 %
Platelets: 191 10*3/uL (ref 150–400)
RBC: 3.93 MIL/uL (ref 3.87–5.11)
RDW: 14.2 % (ref 11.5–15.5)
WBC: 5.7 10*3/uL (ref 4.0–10.5)
nRBC: 0 % (ref 0.0–0.2)

## 2019-10-18 LAB — BASIC METABOLIC PANEL
Anion gap: 12 (ref 5–15)
BUN: 52 mg/dL — ABNORMAL HIGH (ref 6–20)
CO2: 22 mmol/L (ref 22–32)
Calcium: 9.7 mg/dL (ref 8.9–10.3)
Chloride: 104 mmol/L (ref 98–111)
Creatinine, Ser: 1.79 mg/dL — ABNORMAL HIGH (ref 0.44–1.00)
GFR calc Af Amer: 37 mL/min — ABNORMAL LOW (ref >60)
GFR calc non Af Amer: 32 mL/min — ABNORMAL LOW (ref >60)
Glucose, Bld: 163 mg/dL — ABNORMAL HIGH (ref 70–99)
Potassium: 4.5 mmol/L (ref 3.5–5.1)
Sodium: 138 mmol/L (ref 135–145)

## 2019-10-18 LAB — PHOSPHORUS: Phosphorus: 4 mg/dL (ref 2.5–4.6)

## 2019-10-18 LAB — MAGNESIUM: Magnesium: 1.9 mg/dL (ref 1.7–2.4)

## 2019-10-20 LAB — TACROLIMUS LEVEL: Tacrolimus (FK506) - LabCorp: 5.3 ng/mL (ref 2.0–20.0)

## 2019-10-21 ENCOUNTER — Encounter: Payer: BC Managed Care – PPO | Admitting: Speech Pathology

## 2019-10-23 ENCOUNTER — Encounter: Payer: BC Managed Care – PPO | Admitting: Speech Pathology

## 2019-10-23 ENCOUNTER — Ambulatory Visit: Payer: BC Managed Care – PPO | Admitting: Speech Pathology

## 2019-10-28 ENCOUNTER — Ambulatory Visit: Payer: BC Managed Care – PPO | Admitting: Speech Pathology

## 2019-10-28 ENCOUNTER — Encounter: Payer: BC Managed Care – PPO | Admitting: Speech Pathology

## 2019-11-04 ENCOUNTER — Encounter: Payer: BC Managed Care – PPO | Admitting: Speech Pathology

## 2019-11-06 ENCOUNTER — Ambulatory Visit: Payer: BC Managed Care – PPO | Admitting: Speech Pathology

## 2019-11-06 ENCOUNTER — Encounter: Payer: BC Managed Care – PPO | Admitting: Speech Pathology

## 2019-11-06 ENCOUNTER — Other Ambulatory Visit: Payer: Self-pay

## 2019-11-06 ENCOUNTER — Encounter: Payer: Self-pay | Admitting: Speech Pathology

## 2019-11-06 DIAGNOSIS — R471 Dysarthria and anarthria: Secondary | ICD-10-CM

## 2019-11-06 DIAGNOSIS — R41841 Cognitive communication deficit: Secondary | ICD-10-CM | POA: Diagnosis not present

## 2019-11-06 NOTE — Therapy (Signed)
[place] MAIN [facility] SERVICES [address] [place], [place], [address] Phone: [phone]   Fax:  [phone]  Speech Language Pathology Treatment/Discharge Summary  Patient Details  Name: Brandi Carroll MRN: [id number] Date of Birth: 01-28-1966 Referring Provider (SLP): Dr. Manuella Ghazi   Encounter Date: 11/06/2019  End of Session - 11/06/19 1710    Visit Number  4    Number of Visits  5    Date for SLP Re-Evaluation  10/14/19    Authorization Type  [employer] cross    Authorization Time Period  Start 09/16/2019    Authorization - Visit Number  4    Authorization - Number of Visits  10    SLP Start Time  5320    SLP Stop Time   1700    SLP Time Calculation (min)  50 min    Activity Tolerance  Patient tolerated treatment well       Past Medical History:  Diagnosis Date  . Chronic kidney disease   . Chronic sinusitis   . Diabetes mellitus without complication (HCC)    E3X, 7.0 last check; type 1, has insulin pump; subsequently has neuropathy;   . Diabetic neuropathy ([place])   . GERD (gastroesophageal reflux disease)   . Hyperlipemia   . Hypertension    reports it goes either too high or too low; taking lisinopril but hasn't been regulated.  . Hyperthyroidism   . Insulin pump in place   . Nausea and vomiting   . Obesity   . Osteoporosis   . Stroke ([place])   . Tremor     Past Surgical History:  Procedure Laterality Date  . CATARACT EXTRACTION    . COLONOSCOPY WITH PROPOFOL N/A 10/19/2016   Procedure: COLONOSCOPY WITH PROPOFOL;  Surgeon: Lollie Sails, MD;  Location: [facility] ENDOSCOPY;  Service: Endoscopy;  Laterality: N/A;  . EYE SURGERY     bilateral (cataracts)  . FOOT SURGERY    . FOOT SURGERY    . KIDNEY TRANSPLANT  2007    There were no vitals filed for this visit.  Subjective Assessment - 11/06/19 1709    Subjective  The patient states that she feels that she has good working strategies            ADULT SLP  TREATMENT - 11/06/19 0001      General Information   Behavior/Cognition  Alert;Cooperative;Pleasant mood    HPI  Brandi Carroll is a 53 year old female referred for ST evaluation to assess need for/potential benefit of cognitive training. Patient has history of pontine and cerebellar infarcts in 2016 and left side Bell's palsy in 2017. Brain MRI without contrast taken on 08/20/2019 showed advanced atrophy and moderate chronic small vessel disease with no acute intracranial abnormality. Patient experienced sudden onset of left sided facial numbness/reduced sensation in February 2020, which she reports has improved since that time. Patient completed neuropsychological testing on 09/12/2019 and has appointment scheduled to review those results on 09/23/2019. Patient has not received prior SLP treatment.        Treatment Provided   Treatment provided  Cognitive-Linquistic      Pain Assessment   Pain Assessment  No/denies pain      Cognitive-Linquistic Treatment   Treatment focused on  Cognition    Skilled Treatment  Oral Motor/Motor Speech: Patient provided with oral motor exercises and words/phrases to maintain and improve labial range, strength, and coordination.  Reviewed with patient- discussed strategies to get the most out of the  exercises.  Patient reports that she is continuing with the exercises.  Cognition:  Patient provided with written strategies for functional memory, organization, and problems solving.  The patient reports that she is implementing the strategies suggested last session.  The patient discussed difficulties she is encountering at work.  She reports that she feels more confident in her ability to generate functional strategies to maintain her accuracy at work.  The patient reports that she had a telemedicine visit with the neuropsychologist and he reported that a primary cognitive impairment is information processing of complex, layered information.  We discussed simplifying  information for easier processing.  The patient was given a therapeutic exercise book for verbal and visual reasoning.         Assessment / Recommendations / Plan   Plan  Continue with current plan of care      Progression Toward Goals   Progression toward goals  Progressing toward goals       SLP Education - 11/06/19 1709    Education Details  speech exercises/problem solving/information processing    Person(s) Educated  Patient    Methods  Explanation    Comprehension  Verbalized understanding         SLP Long Term Goals - 11/06/19 1711      SLP LONG TERM GOAL #1   Title  Patient will independently execute Oral Motor/Motor Speech HEP.    Status  Achieved      SLP LONG TERM GOAL #2   Title  Patient will identify cognitive-communication barriers and participate in developing functional compensatory strategies.    Status  Achieved       Plan - 11/06/19 1710    Clinical Impression Statement  The patient has been given speech exercises to maintain and improve labial range, strength, and coordination; these have been reviewed and revised.  She has information to begin determining best strategies for functional cognitive compensation; she has begun.  The patient understands that her primary cognitive deficit is processing complex, layered information and has tools to help her simplify information.  The patient has met her goals and is discharged from speech therapy.    Speech Therapy Frequency  Other (comment)   Discharge   Treatment/Interventions  Patient/family education;Oral motor exercises;Compensatory strategies    Potential to Achieve Goals  Good    Potential Considerations  Ability to learn/carryover information;Previous level of function;Co-morbidities;Severity of impairments;Cooperation/participation level;Medical prognosis;Family/community support    SLP Home Exercise Plan  Provided    Consulted and Agree with Plan of Care  Patient       Patient will benefit from  skilled therapeutic intervention in order to improve the following deficits and impairments:   Cognitive communication deficit  Dysarthria and anarthria    Problem List There are no problems to display for this patient.  Leroy Sea, MS/CCC- SLP  Brandi Carroll 11/06/2019, 5:12 PM  [place] MAIN [facility] SERVICES [address] [place], [place], [address] Phone: [phone]   Fax:  [phone]   Name: Brandi Carroll MRN: [id number] Date of Birth: 1966-05-11

## 2019-12-06 ENCOUNTER — Encounter
Admission: RE | Admit: 2019-12-06 | Discharge: 2019-12-06 | Disposition: A | Discharge status: Home / Self Care | Payer: BC Managed Care – PPO | Source: Ambulatory Visit | Attending: Nephrology | Admitting: Nephrology

## 2019-12-06 DIAGNOSIS — Z789 Other specified health status: Secondary | ICD-10-CM | POA: Insufficient documentation

## 2019-12-06 DIAGNOSIS — N189 Chronic kidney disease, unspecified: Secondary | ICD-10-CM | POA: Diagnosis not present

## 2019-12-06 DIAGNOSIS — Z940 Kidney transplant status: Secondary | ICD-10-CM | POA: Diagnosis not present

## 2019-12-06 DIAGNOSIS — Z114 Encounter for screening for human immunodeficiency virus [HIV]: Secondary | ICD-10-CM | POA: Insufficient documentation

## 2019-12-06 DIAGNOSIS — E559 Vitamin D deficiency, unspecified: Secondary | ICD-10-CM | POA: Insufficient documentation

## 2019-12-06 DIAGNOSIS — N390 Urinary tract infection, site not specified: Secondary | ICD-10-CM | POA: Insufficient documentation

## 2019-12-06 DIAGNOSIS — D899 Disorder involving the immune mechanism, unspecified: Secondary | ICD-10-CM | POA: Insufficient documentation

## 2019-12-06 DIAGNOSIS — Z79899 Other long term (current) drug therapy: Secondary | ICD-10-CM | POA: Diagnosis not present

## 2019-12-06 DIAGNOSIS — B259 Cytomegaloviral disease, unspecified: Secondary | ICD-10-CM | POA: Diagnosis not present

## 2019-12-06 DIAGNOSIS — E1129 Type 2 diabetes mellitus with other diabetic kidney complication: Secondary | ICD-10-CM | POA: Diagnosis not present

## 2019-12-06 DIAGNOSIS — Z9483 Pancreas transplant status: Secondary | ICD-10-CM | POA: Diagnosis not present

## 2019-12-06 DIAGNOSIS — D631 Anemia in chronic kidney disease: Secondary | ICD-10-CM | POA: Diagnosis not present

## 2019-12-06 LAB — CBC WITH DIFFERENTIAL/PLATELET
Abs Immature Granulocytes: 0.01 10*3/uL (ref 0.00–0.07)
Basophils Absolute: 0 10*3/uL (ref 0.0–0.1)
Basophils Relative: 0 %
Eosinophils Absolute: 0.1 10*3/uL (ref 0.0–0.5)
Eosinophils Relative: 2 %
HCT: 35.8 % — ABNORMAL LOW (ref 36.0–46.0)
Hemoglobin: 11.2 g/dL — ABNORMAL LOW (ref 12.0–15.0)
Immature Granulocytes: 0 %
Lymphocytes Relative: 29 %
Lymphs Abs: 1.6 10*3/uL (ref 0.7–4.0)
MCH: 27.3 pg (ref 26.0–34.0)
MCHC: 31.3 g/dL (ref 30.0–36.0)
MCV: 87.1 fL (ref 80.0–100.0)
Monocytes Absolute: 0.5 10*3/uL (ref 0.1–1.0)
Monocytes Relative: 9 %
Neutro Abs: 3.2 10*3/uL (ref 1.7–7.7)
Neutrophils Relative %: 60 %
Platelets: 163 10*3/uL (ref 150–400)
RBC: 4.11 MIL/uL (ref 3.87–5.11)
RDW: 13.2 % (ref 11.5–15.5)
WBC: 5.4 10*3/uL (ref 4.0–10.5)
nRBC: 0 % (ref 0.0–0.2)

## 2019-12-06 LAB — BASIC METABOLIC PANEL
Anion gap: 11 (ref 5–15)
BUN: 40 mg/dL — ABNORMAL HIGH (ref 6–20)
CO2: 24 mmol/L (ref 22–32)
Calcium: 9.3 mg/dL (ref 8.9–10.3)
Chloride: 104 mmol/L (ref 98–111)
Creatinine, Ser: 1.95 mg/dL — ABNORMAL HIGH (ref 0.44–1.00)
GFR calc Af Amer: 33 mL/min — ABNORMAL LOW (ref >60)
GFR calc non Af Amer: 28 mL/min — ABNORMAL LOW (ref >60)
Glucose, Bld: 97 mg/dL (ref 70–99)
Potassium: 4.4 mmol/L (ref 3.5–5.1)
Sodium: 139 mmol/L (ref 135–145)

## 2019-12-09 LAB — TACROLIMUS LEVEL: Tacrolimus (FK506) - LabCorp: 3.7 ng/mL (ref 2.0–20.0)

## 2019-12-13 ENCOUNTER — Other Ambulatory Visit: Payer: Self-pay

## 2019-12-13 ENCOUNTER — Other Ambulatory Visit
Admission: RE | Admit: 2019-12-13 | Discharge: 2019-12-13 | Disposition: A | Discharge status: Home / Self Care | Payer: BC Managed Care – PPO | Attending: Nephrology | Admitting: Nephrology

## 2019-12-13 DIAGNOSIS — Z940 Kidney transplant status: Secondary | ICD-10-CM | POA: Diagnosis not present

## 2019-12-13 DIAGNOSIS — D631 Anemia in chronic kidney disease: Secondary | ICD-10-CM | POA: Insufficient documentation

## 2019-12-13 DIAGNOSIS — Z9483 Pancreas transplant status: Secondary | ICD-10-CM | POA: Insufficient documentation

## 2019-12-13 DIAGNOSIS — D899 Disorder involving the immune mechanism, unspecified: Secondary | ICD-10-CM | POA: Insufficient documentation

## 2019-12-13 DIAGNOSIS — E559 Vitamin D deficiency, unspecified: Secondary | ICD-10-CM | POA: Diagnosis not present

## 2019-12-13 DIAGNOSIS — Z79899 Other long term (current) drug therapy: Secondary | ICD-10-CM | POA: Insufficient documentation

## 2019-12-13 DIAGNOSIS — B259 Cytomegaloviral disease, unspecified: Secondary | ICD-10-CM | POA: Diagnosis not present

## 2019-12-13 DIAGNOSIS — N189 Chronic kidney disease, unspecified: Secondary | ICD-10-CM | POA: Insufficient documentation

## 2019-12-13 DIAGNOSIS — Z09 Encounter for follow-up examination after completed treatment for conditions other than malignant neoplasm: Secondary | ICD-10-CM | POA: Diagnosis not present

## 2019-12-13 DIAGNOSIS — Z789 Other specified health status: Secondary | ICD-10-CM | POA: Insufficient documentation

## 2019-12-13 DIAGNOSIS — E1129 Type 2 diabetes mellitus with other diabetic kidney complication: Secondary | ICD-10-CM | POA: Insufficient documentation

## 2019-12-13 DIAGNOSIS — N390 Urinary tract infection, site not specified: Secondary | ICD-10-CM | POA: Diagnosis not present

## 2019-12-13 DIAGNOSIS — Z114 Encounter for screening for human immunodeficiency virus [HIV]: Secondary | ICD-10-CM | POA: Insufficient documentation

## 2019-12-13 LAB — CBC WITH DIFFERENTIAL/PLATELET
Abs Immature Granulocytes: 0.01 10*3/uL (ref 0.00–0.07)
Basophils Absolute: 0 10*3/uL (ref 0.0–0.1)
Basophils Relative: 0 %
Eosinophils Absolute: 0.2 10*3/uL (ref 0.0–0.5)
Eosinophils Relative: 4 %
HCT: 33.6 % — ABNORMAL LOW (ref 36.0–46.0)
Hemoglobin: 10.6 g/dL — ABNORMAL LOW (ref 12.0–15.0)
Immature Granulocytes: 0 %
Lymphocytes Relative: 41 %
Lymphs Abs: 1.9 10*3/uL (ref 0.7–4.0)
MCH: 27.4 pg (ref 26.0–34.0)
MCHC: 31.5 g/dL (ref 30.0–36.0)
MCV: 86.8 fL (ref 80.0–100.0)
Monocytes Absolute: 0.4 10*3/uL (ref 0.1–1.0)
Monocytes Relative: 8 %
Neutro Abs: 2.2 10*3/uL (ref 1.7–7.7)
Neutrophils Relative %: 47 %
Platelets: 158 10*3/uL (ref 150–400)
RBC: 3.87 MIL/uL (ref 3.87–5.11)
RDW: 13.1 % (ref 11.5–15.5)
WBC: 4.7 10*3/uL (ref 4.0–10.5)
nRBC: 0 % (ref 0.0–0.2)

## 2019-12-13 LAB — BASIC METABOLIC PANEL
Anion gap: 9 (ref 5–15)
BUN: 40 mg/dL — ABNORMAL HIGH (ref 6–20)
CO2: 24 mmol/L (ref 22–32)
Calcium: 9 mg/dL (ref 8.9–10.3)
Chloride: 106 mmol/L (ref 98–111)
Creatinine, Ser: 1.82 mg/dL — ABNORMAL HIGH (ref 0.44–1.00)
GFR calc Af Amer: 36 mL/min — ABNORMAL LOW (ref >60)
GFR calc non Af Amer: 31 mL/min — ABNORMAL LOW (ref >60)
Glucose, Bld: 86 mg/dL (ref 70–99)
Potassium: 4.2 mmol/L (ref 3.5–5.1)
Sodium: 139 mmol/L (ref 135–145)

## 2019-12-13 LAB — PHOSPHORUS: Phosphorus: 4.2 mg/dL (ref 2.5–4.6)

## 2019-12-13 LAB — MAGNESIUM: Magnesium: 1.6 mg/dL — ABNORMAL LOW (ref 1.7–2.4)

## 2019-12-16 LAB — TACROLIMUS LEVEL: Tacrolimus (FK506) - LabCorp: 5.3 ng/mL (ref 2.0–20.0)

## 2020-01-10 ENCOUNTER — Other Ambulatory Visit: Payer: Self-pay

## 2020-01-10 ENCOUNTER — Other Ambulatory Visit
Admission: RE | Admit: 2020-01-10 | Discharge: 2020-01-10 | Disposition: A | Discharge status: Home / Self Care | Payer: BC Managed Care – PPO | Source: Ambulatory Visit | Attending: Nephrology | Admitting: Nephrology

## 2020-01-10 DIAGNOSIS — D899 Disorder involving the immune mechanism, unspecified: Secondary | ICD-10-CM | POA: Diagnosis not present

## 2020-01-10 DIAGNOSIS — Z79899 Other long term (current) drug therapy: Secondary | ICD-10-CM | POA: Insufficient documentation

## 2020-01-10 DIAGNOSIS — N390 Urinary tract infection, site not specified: Secondary | ICD-10-CM | POA: Insufficient documentation

## 2020-01-10 DIAGNOSIS — E1129 Type 2 diabetes mellitus with other diabetic kidney complication: Secondary | ICD-10-CM | POA: Diagnosis not present

## 2020-01-10 DIAGNOSIS — E1122 Type 2 diabetes mellitus with diabetic chronic kidney disease: Secondary | ICD-10-CM | POA: Diagnosis not present

## 2020-01-10 DIAGNOSIS — B259 Cytomegaloviral disease, unspecified: Secondary | ICD-10-CM | POA: Insufficient documentation

## 2020-01-10 DIAGNOSIS — Z940 Kidney transplant status: Secondary | ICD-10-CM | POA: Insufficient documentation

## 2020-01-10 DIAGNOSIS — D631 Anemia in chronic kidney disease: Secondary | ICD-10-CM | POA: Diagnosis not present

## 2020-01-10 DIAGNOSIS — Z114 Encounter for screening for human immunodeficiency virus [HIV]: Secondary | ICD-10-CM | POA: Diagnosis not present

## 2020-01-10 DIAGNOSIS — Z789 Other specified health status: Secondary | ICD-10-CM | POA: Insufficient documentation

## 2020-01-10 DIAGNOSIS — E559 Vitamin D deficiency, unspecified: Secondary | ICD-10-CM | POA: Insufficient documentation

## 2020-01-10 DIAGNOSIS — N189 Chronic kidney disease, unspecified: Secondary | ICD-10-CM | POA: Diagnosis not present

## 2020-01-10 DIAGNOSIS — Z9483 Pancreas transplant status: Secondary | ICD-10-CM | POA: Diagnosis not present

## 2020-01-10 LAB — BASIC METABOLIC PANEL
Anion gap: 11 (ref 5–15)
BUN: 41 mg/dL — ABNORMAL HIGH (ref 6–20)
CO2: 24 mmol/L (ref 22–32)
Calcium: 9.2 mg/dL (ref 8.9–10.3)
Chloride: 100 mmol/L (ref 98–111)
Creatinine, Ser: 2.01 mg/dL — ABNORMAL HIGH (ref 0.44–1.00)
GFR calc Af Amer: 32 mL/min — ABNORMAL LOW (ref >60)
GFR calc non Af Amer: 27 mL/min — ABNORMAL LOW (ref >60)
Glucose, Bld: 124 mg/dL — ABNORMAL HIGH (ref 70–99)
Potassium: 4.5 mmol/L (ref 3.5–5.1)
Sodium: 135 mmol/L (ref 135–145)

## 2020-01-10 LAB — CBC WITH DIFFERENTIAL/PLATELET
Abs Immature Granulocytes: 0.01 10*3/uL (ref 0.00–0.07)
Basophils Absolute: 0 10*3/uL (ref 0.0–0.1)
Basophils Relative: 0 %
Eosinophils Absolute: 0.2 10*3/uL (ref 0.0–0.5)
Eosinophils Relative: 3 %
HCT: 36.4 % (ref 36.0–46.0)
Hemoglobin: 11.3 g/dL — ABNORMAL LOW (ref 12.0–15.0)
Immature Granulocytes: 0 %
Lymphocytes Relative: 30 %
Lymphs Abs: 1.6 10*3/uL (ref 0.7–4.0)
MCH: 27.3 pg (ref 26.0–34.0)
MCHC: 31 g/dL (ref 30.0–36.0)
MCV: 87.9 fL (ref 80.0–100.0)
Monocytes Absolute: 0.5 10*3/uL (ref 0.1–1.0)
Monocytes Relative: 9 %
Neutro Abs: 3.1 10*3/uL (ref 1.7–7.7)
Neutrophils Relative %: 58 %
Platelets: 164 10*3/uL (ref 150–400)
RBC: 4.14 MIL/uL (ref 3.87–5.11)
RDW: 13.3 % (ref 11.5–15.5)
WBC: 5.3 10*3/uL (ref 4.0–10.5)
nRBC: 0 % (ref 0.0–0.2)

## 2020-01-10 LAB — PHOSPHORUS: Phosphorus: 3.9 mg/dL (ref 2.5–4.6)

## 2020-01-10 LAB — MAGNESIUM: Magnesium: 1.9 mg/dL (ref 1.7–2.4)

## 2020-01-13 LAB — TACROLIMUS LEVEL: Tacrolimus (FK506) - LabCorp: 2.7 ng/mL (ref 2.0–20.0)

## 2020-01-31 ENCOUNTER — Other Ambulatory Visit
Admission: RE | Admit: 2020-01-31 | Discharge: 2020-01-31 | Disposition: A | Discharge status: Home / Self Care | Payer: BC Managed Care – PPO | Source: Ambulatory Visit | Attending: Nephrology | Admitting: Nephrology

## 2020-01-31 DIAGNOSIS — B259 Cytomegaloviral disease, unspecified: Secondary | ICD-10-CM | POA: Diagnosis present

## 2020-01-31 DIAGNOSIS — N390 Urinary tract infection, site not specified: Secondary | ICD-10-CM | POA: Diagnosis not present

## 2020-01-31 DIAGNOSIS — D899 Disorder involving the immune mechanism, unspecified: Secondary | ICD-10-CM | POA: Diagnosis not present

## 2020-01-31 DIAGNOSIS — Z940 Kidney transplant status: Secondary | ICD-10-CM | POA: Insufficient documentation

## 2020-01-31 DIAGNOSIS — Z789 Other specified health status: Secondary | ICD-10-CM | POA: Insufficient documentation

## 2020-01-31 DIAGNOSIS — Z9483 Pancreas transplant status: Secondary | ICD-10-CM | POA: Insufficient documentation

## 2020-01-31 DIAGNOSIS — Z09 Encounter for follow-up examination after completed treatment for conditions other than malignant neoplasm: Secondary | ICD-10-CM | POA: Insufficient documentation

## 2020-01-31 DIAGNOSIS — D631 Anemia in chronic kidney disease: Secondary | ICD-10-CM | POA: Diagnosis not present

## 2020-01-31 DIAGNOSIS — E559 Vitamin D deficiency, unspecified: Secondary | ICD-10-CM | POA: Insufficient documentation

## 2020-01-31 DIAGNOSIS — E1129 Type 2 diabetes mellitus with other diabetic kidney complication: Secondary | ICD-10-CM | POA: Diagnosis not present

## 2020-01-31 DIAGNOSIS — Z79899 Other long term (current) drug therapy: Secondary | ICD-10-CM | POA: Insufficient documentation

## 2020-01-31 DIAGNOSIS — Z114 Encounter for screening for human immunodeficiency virus [HIV]: Secondary | ICD-10-CM | POA: Diagnosis not present

## 2020-01-31 LAB — CBC WITH DIFFERENTIAL/PLATELET
Abs Immature Granulocytes: 0.01 10*3/uL (ref 0.00–0.07)
Basophils Absolute: 0 10*3/uL (ref 0.0–0.1)
Basophils Relative: 0 %
Eosinophils Absolute: 0.1 10*3/uL (ref 0.0–0.5)
Eosinophils Relative: 2 %
HCT: 35.1 % — ABNORMAL LOW (ref 36.0–46.0)
Hemoglobin: 10.9 g/dL — ABNORMAL LOW (ref 12.0–15.0)
Immature Granulocytes: 0 %
Lymphocytes Relative: 38 %
Lymphs Abs: 2.1 10*3/uL (ref 0.7–4.0)
MCH: 26.6 pg (ref 26.0–34.0)
MCHC: 31.1 g/dL (ref 30.0–36.0)
MCV: 85.6 fL (ref 80.0–100.0)
Monocytes Absolute: 0.4 10*3/uL (ref 0.1–1.0)
Monocytes Relative: 8 %
Neutro Abs: 2.8 10*3/uL (ref 1.7–7.7)
Neutrophils Relative %: 52 %
Platelets: 175 10*3/uL (ref 150–400)
RBC: 4.1 MIL/uL (ref 3.87–5.11)
RDW: 13.4 % (ref 11.5–15.5)
WBC: 5.4 10*3/uL (ref 4.0–10.5)
nRBC: 0 % (ref 0.0–0.2)

## 2020-01-31 LAB — BASIC METABOLIC PANEL
Anion gap: 12 (ref 5–15)
BUN: 46 mg/dL — ABNORMAL HIGH (ref 6–20)
CO2: 23 mmol/L (ref 22–32)
Calcium: 9.6 mg/dL (ref 8.9–10.3)
Chloride: 101 mmol/L (ref 98–111)
Creatinine, Ser: 1.75 mg/dL — ABNORMAL HIGH (ref 0.44–1.00)
GFR calc Af Amer: 38 mL/min — ABNORMAL LOW (ref >60)
GFR calc non Af Amer: 32 mL/min — ABNORMAL LOW (ref >60)
Glucose, Bld: 242 mg/dL — ABNORMAL HIGH (ref 70–99)
Potassium: 4.2 mmol/L (ref 3.5–5.1)
Sodium: 136 mmol/L (ref 135–145)

## 2020-02-03 LAB — TACROLIMUS LEVEL: Tacrolimus (FK506) - LabCorp: 5.4 ng/mL (ref 2.0–20.0)

## 2020-03-20 ENCOUNTER — Other Ambulatory Visit
Admission: RE | Admit: 2020-03-20 | Discharge: 2020-03-20 | Disposition: A | Discharge status: Home / Self Care | Payer: BC Managed Care – PPO | Source: Ambulatory Visit | Attending: Nephrology | Admitting: Nephrology

## 2020-03-20 DIAGNOSIS — E1129 Type 2 diabetes mellitus with other diabetic kidney complication: Secondary | ICD-10-CM | POA: Diagnosis not present

## 2020-03-20 DIAGNOSIS — Z9483 Pancreas transplant status: Secondary | ICD-10-CM | POA: Insufficient documentation

## 2020-03-20 DIAGNOSIS — Z940 Kidney transplant status: Secondary | ICD-10-CM | POA: Insufficient documentation

## 2020-03-20 DIAGNOSIS — N390 Urinary tract infection, site not specified: Secondary | ICD-10-CM | POA: Insufficient documentation

## 2020-03-20 DIAGNOSIS — E559 Vitamin D deficiency, unspecified: Secondary | ICD-10-CM | POA: Diagnosis not present

## 2020-03-20 DIAGNOSIS — Z79899 Other long term (current) drug therapy: Secondary | ICD-10-CM | POA: Diagnosis not present

## 2020-03-20 DIAGNOSIS — D631 Anemia in chronic kidney disease: Secondary | ICD-10-CM | POA: Diagnosis not present

## 2020-03-20 DIAGNOSIS — Z114 Encounter for screening for human immunodeficiency virus [HIV]: Secondary | ICD-10-CM | POA: Diagnosis not present

## 2020-03-20 DIAGNOSIS — Z09 Encounter for follow-up examination after completed treatment for conditions other than malignant neoplasm: Secondary | ICD-10-CM | POA: Diagnosis not present

## 2020-03-20 DIAGNOSIS — B259 Cytomegaloviral disease, unspecified: Secondary | ICD-10-CM | POA: Diagnosis present

## 2020-03-20 DIAGNOSIS — D899 Disorder involving the immune mechanism, unspecified: Secondary | ICD-10-CM | POA: Insufficient documentation

## 2020-03-20 DIAGNOSIS — Z789 Other specified health status: Secondary | ICD-10-CM | POA: Insufficient documentation

## 2020-03-20 LAB — MAGNESIUM: Magnesium: 1.8 mg/dL (ref 1.7–2.4)

## 2020-03-20 LAB — CBC WITH DIFFERENTIAL/PLATELET
Abs Immature Granulocytes: 0.01 10*3/uL (ref 0.00–0.07)
Basophils Absolute: 0 10*3/uL (ref 0.0–0.1)
Basophils Relative: 1 %
Eosinophils Absolute: 0.1 10*3/uL (ref 0.0–0.5)
Eosinophils Relative: 3 %
HCT: 36.1 % (ref 36.0–46.0)
Hemoglobin: 11.4 g/dL — ABNORMAL LOW (ref 12.0–15.0)
Immature Granulocytes: 0 %
Lymphocytes Relative: 36 %
Lymphs Abs: 1.6 10*3/uL (ref 0.7–4.0)
MCH: 27.3 pg (ref 26.0–34.0)
MCHC: 31.6 g/dL (ref 30.0–36.0)
MCV: 86.4 fL (ref 80.0–100.0)
Monocytes Absolute: 0.4 10*3/uL (ref 0.1–1.0)
Monocytes Relative: 8 %
Neutro Abs: 2.3 10*3/uL (ref 1.7–7.7)
Neutrophils Relative %: 52 %
Platelets: 168 10*3/uL (ref 150–400)
RBC: 4.18 MIL/uL (ref 3.87–5.11)
RDW: 14 % (ref 11.5–15.5)
WBC: 4.4 10*3/uL (ref 4.0–10.5)
nRBC: 0 % (ref 0.0–0.2)

## 2020-03-20 LAB — BASIC METABOLIC PANEL
Anion gap: 9 (ref 5–15)
BUN: 28 mg/dL — ABNORMAL HIGH (ref 6–20)
CO2: 27 mmol/L (ref 22–32)
Calcium: 9 mg/dL (ref 8.9–10.3)
Chloride: 103 mmol/L (ref 98–111)
Creatinine, Ser: 1.57 mg/dL — ABNORMAL HIGH (ref 0.44–1.00)
GFR calc Af Amer: 43 mL/min — ABNORMAL LOW (ref >60)
GFR calc non Af Amer: 37 mL/min — ABNORMAL LOW (ref >60)
Glucose, Bld: 48 mg/dL — ABNORMAL LOW (ref 70–99)
Potassium: 4.2 mmol/L (ref 3.5–5.1)
Sodium: 139 mmol/L (ref 135–145)

## 2020-03-20 LAB — PHOSPHORUS: Phosphorus: 4 mg/dL (ref 2.5–4.6)

## 2020-03-22 LAB — TACROLIMUS LEVEL: Tacrolimus (FK506) - LabCorp: 7.6 ng/mL (ref 2.0–20.0)

## 2020-05-01 ENCOUNTER — Other Ambulatory Visit
Admission: RE | Admit: 2020-05-01 | Discharge: 2020-05-01 | Disposition: A | Discharge status: Home / Self Care | Payer: BC Managed Care – PPO | Source: Ambulatory Visit | Attending: Nephrology | Admitting: Nephrology

## 2020-05-01 DIAGNOSIS — D631 Anemia in chronic kidney disease: Secondary | ICD-10-CM | POA: Insufficient documentation

## 2020-05-01 DIAGNOSIS — Z79899 Other long term (current) drug therapy: Secondary | ICD-10-CM | POA: Diagnosis not present

## 2020-05-01 DIAGNOSIS — E559 Vitamin D deficiency, unspecified: Secondary | ICD-10-CM | POA: Diagnosis not present

## 2020-05-01 DIAGNOSIS — Z9483 Pancreas transplant status: Secondary | ICD-10-CM | POA: Diagnosis not present

## 2020-05-01 DIAGNOSIS — T8610 Unspecified complication of kidney transplant: Secondary | ICD-10-CM | POA: Diagnosis not present

## 2020-05-01 DIAGNOSIS — E1129 Type 2 diabetes mellitus with other diabetic kidney complication: Secondary | ICD-10-CM | POA: Insufficient documentation

## 2020-05-01 DIAGNOSIS — B259 Cytomegaloviral disease, unspecified: Secondary | ICD-10-CM | POA: Insufficient documentation

## 2020-05-01 DIAGNOSIS — Z114 Encounter for screening for human immunodeficiency virus [HIV]: Secondary | ICD-10-CM | POA: Diagnosis not present

## 2020-05-01 DIAGNOSIS — N390 Urinary tract infection, site not specified: Secondary | ICD-10-CM | POA: Insufficient documentation

## 2020-05-01 DIAGNOSIS — Z789 Other specified health status: Secondary | ICD-10-CM | POA: Diagnosis not present

## 2020-05-01 DIAGNOSIS — D899 Disorder involving the immune mechanism, unspecified: Secondary | ICD-10-CM | POA: Diagnosis not present

## 2020-05-01 DIAGNOSIS — Z09 Encounter for follow-up examination after completed treatment for conditions other than malignant neoplasm: Secondary | ICD-10-CM | POA: Insufficient documentation

## 2020-05-01 LAB — PHOSPHORUS: Phosphorus: 4.4 mg/dL (ref 2.5–4.6)

## 2020-05-01 LAB — CBC WITH DIFFERENTIAL/PLATELET
Abs Immature Granulocytes: 0.01 10*3/uL (ref 0.00–0.07)
Basophils Absolute: 0 10*3/uL (ref 0.0–0.1)
Basophils Relative: 0 %
Eosinophils Absolute: 0.1 10*3/uL (ref 0.0–0.5)
Eosinophils Relative: 3 %
HCT: 34.5 % — ABNORMAL LOW (ref 36.0–46.0)
Hemoglobin: 11.2 g/dL — ABNORMAL LOW (ref 12.0–15.0)
Immature Granulocytes: 0 %
Lymphocytes Relative: 30 %
Lymphs Abs: 1.5 10*3/uL (ref 0.7–4.0)
MCH: 27.6 pg (ref 26.0–34.0)
MCHC: 32.5 g/dL (ref 30.0–36.0)
MCV: 85 fL (ref 80.0–100.0)
Monocytes Absolute: 0.5 10*3/uL (ref 0.1–1.0)
Monocytes Relative: 9 %
Neutro Abs: 2.8 10*3/uL (ref 1.7–7.7)
Neutrophils Relative %: 58 %
Platelets: 158 10*3/uL (ref 150–400)
RBC: 4.06 MIL/uL (ref 3.87–5.11)
RDW: 14.4 % (ref 11.5–15.5)
WBC: 4.9 10*3/uL (ref 4.0–10.5)
nRBC: 0 % (ref 0.0–0.2)

## 2020-05-01 LAB — BASIC METABOLIC PANEL
Anion gap: 10 (ref 5–15)
BUN: 41 mg/dL — ABNORMAL HIGH (ref 6–20)
CO2: 26 mmol/L (ref 22–32)
Calcium: 9.1 mg/dL (ref 8.9–10.3)
Chloride: 102 mmol/L (ref 98–111)
Creatinine, Ser: 1.78 mg/dL — ABNORMAL HIGH (ref 0.44–1.00)
GFR calc Af Amer: 37 mL/min — ABNORMAL LOW (ref >60)
GFR calc non Af Amer: 32 mL/min — ABNORMAL LOW (ref >60)
Glucose, Bld: 84 mg/dL (ref 70–99)
Potassium: 4.6 mmol/L (ref 3.5–5.1)
Sodium: 138 mmol/L (ref 135–145)

## 2020-05-01 LAB — MAGNESIUM: Magnesium: 1.8 mg/dL (ref 1.7–2.4)

## 2020-05-03 LAB — TACROLIMUS LEVEL: Tacrolimus (FK506) - LabCorp: 4.1 ng/mL (ref 2.0–20.0)

## 2020-05-29 ENCOUNTER — Other Ambulatory Visit: Payer: Self-pay

## 2020-05-29 ENCOUNTER — Encounter
Admission: RE | Admit: 2020-05-29 | Discharge: 2020-05-29 | Disposition: A | Discharge status: Home / Self Care | Payer: BC Managed Care – PPO | Attending: Nephrology | Admitting: Nephrology

## 2020-05-29 DIAGNOSIS — D899 Disorder involving the immune mechanism, unspecified: Secondary | ICD-10-CM | POA: Diagnosis not present

## 2020-05-29 DIAGNOSIS — Z79899 Other long term (current) drug therapy: Secondary | ICD-10-CM | POA: Insufficient documentation

## 2020-05-29 DIAGNOSIS — Z9483 Pancreas transplant status: Secondary | ICD-10-CM | POA: Insufficient documentation

## 2020-05-29 DIAGNOSIS — Z940 Kidney transplant status: Secondary | ICD-10-CM | POA: Insufficient documentation

## 2020-05-29 DIAGNOSIS — E1129 Type 2 diabetes mellitus with other diabetic kidney complication: Secondary | ICD-10-CM | POA: Insufficient documentation

## 2020-05-29 LAB — CBC WITH DIFFERENTIAL/PLATELET
Abs Immature Granulocytes: 0.01 10*3/uL (ref 0.00–0.07)
Basophils Absolute: 0 10*3/uL (ref 0.0–0.1)
Basophils Relative: 0 %
Eosinophils Absolute: 0.1 10*3/uL (ref 0.0–0.5)
Eosinophils Relative: 3 %
HCT: 36.4 % (ref 36.0–46.0)
Hemoglobin: 11.8 g/dL — ABNORMAL LOW (ref 12.0–15.0)
Immature Granulocytes: 0 %
Lymphocytes Relative: 37 %
Lymphs Abs: 1.9 10*3/uL (ref 0.7–4.0)
MCH: 27.4 pg (ref 26.0–34.0)
MCHC: 32.4 g/dL (ref 30.0–36.0)
MCV: 84.7 fL (ref 80.0–100.0)
Monocytes Absolute: 0.4 10*3/uL (ref 0.1–1.0)
Monocytes Relative: 7 %
Neutro Abs: 2.8 10*3/uL (ref 1.7–7.7)
Neutrophils Relative %: 53 %
Platelets: 198 10*3/uL (ref 150–400)
RBC: 4.3 MIL/uL (ref 3.87–5.11)
RDW: 13.5 % (ref 11.5–15.5)
WBC: 5.2 10*3/uL (ref 4.0–10.5)
nRBC: 0 % (ref 0.0–0.2)

## 2020-05-29 LAB — BASIC METABOLIC PANEL
Anion gap: 10 (ref 5–15)
BUN: 42 mg/dL — ABNORMAL HIGH (ref 6–20)
CO2: 27 mmol/L (ref 22–32)
Calcium: 9.6 mg/dL (ref 8.9–10.3)
Chloride: 104 mmol/L (ref 98–111)
Creatinine, Ser: 1.9 mg/dL — ABNORMAL HIGH (ref 0.44–1.00)
GFR calc Af Amer: 34 mL/min — ABNORMAL LOW (ref >60)
GFR calc non Af Amer: 29 mL/min — ABNORMAL LOW (ref >60)
Glucose, Bld: 102 mg/dL — ABNORMAL HIGH (ref 70–99)
Potassium: 4.6 mmol/L (ref 3.5–5.1)
Sodium: 141 mmol/L (ref 135–145)

## 2020-05-29 LAB — MAGNESIUM: Magnesium: 1.8 mg/dL (ref 1.7–2.4)

## 2020-05-29 LAB — PHOSPHORUS: Phosphorus: 3.9 mg/dL (ref 2.5–4.6)

## 2020-06-01 LAB — TACROLIMUS LEVEL: Tacrolimus (FK506) - LabCorp: 4.2 ng/mL (ref 2.0–20.0)

## 2020-07-24 ENCOUNTER — Other Ambulatory Visit
Admission: RE | Admit: 2020-07-24 | Discharge: 2020-07-24 | Disposition: A | Discharge status: Home / Self Care | Payer: BC Managed Care – PPO | Attending: Nephrology | Admitting: Nephrology

## 2020-07-24 DIAGNOSIS — Z940 Kidney transplant status: Secondary | ICD-10-CM | POA: Diagnosis not present

## 2020-07-24 DIAGNOSIS — E1129 Type 2 diabetes mellitus with other diabetic kidney complication: Secondary | ICD-10-CM | POA: Diagnosis not present

## 2020-07-24 DIAGNOSIS — N390 Urinary tract infection, site not specified: Secondary | ICD-10-CM | POA: Insufficient documentation

## 2020-07-24 DIAGNOSIS — D899 Disorder involving the immune mechanism, unspecified: Secondary | ICD-10-CM | POA: Diagnosis not present

## 2020-07-24 DIAGNOSIS — Z114 Encounter for screening for human immunodeficiency virus [HIV]: Secondary | ICD-10-CM | POA: Insufficient documentation

## 2020-07-24 DIAGNOSIS — D631 Anemia in chronic kidney disease: Secondary | ICD-10-CM | POA: Diagnosis not present

## 2020-07-24 DIAGNOSIS — Z09 Encounter for follow-up examination after completed treatment for conditions other than malignant neoplasm: Secondary | ICD-10-CM | POA: Insufficient documentation

## 2020-07-24 DIAGNOSIS — Z789 Other specified health status: Secondary | ICD-10-CM | POA: Diagnosis not present

## 2020-07-24 DIAGNOSIS — B259 Cytomegaloviral disease, unspecified: Secondary | ICD-10-CM | POA: Diagnosis not present

## 2020-07-24 DIAGNOSIS — Z79899 Other long term (current) drug therapy: Secondary | ICD-10-CM | POA: Diagnosis not present

## 2020-07-24 DIAGNOSIS — E559 Vitamin D deficiency, unspecified: Secondary | ICD-10-CM | POA: Diagnosis not present

## 2020-07-24 DIAGNOSIS — Z9483 Pancreas transplant status: Secondary | ICD-10-CM | POA: Insufficient documentation

## 2020-07-24 LAB — CBC WITH DIFFERENTIAL/PLATELET
Abs Immature Granulocytes: 0.01 10*3/uL (ref 0.00–0.07)
Basophils Absolute: 0 10*3/uL (ref 0.0–0.1)
Basophils Relative: 0 %
Eosinophils Absolute: 0.1 10*3/uL (ref 0.0–0.5)
Eosinophils Relative: 3 %
HCT: 35.5 % — ABNORMAL LOW (ref 36.0–46.0)
Hemoglobin: 11.1 g/dL — ABNORMAL LOW (ref 12.0–15.0)
Immature Granulocytes: 0 %
Lymphocytes Relative: 39 %
Lymphs Abs: 1.5 10*3/uL (ref 0.7–4.0)
MCH: 27.1 pg (ref 26.0–34.0)
MCHC: 31.3 g/dL (ref 30.0–36.0)
MCV: 86.6 fL (ref 80.0–100.0)
Monocytes Absolute: 0.4 10*3/uL (ref 0.1–1.0)
Monocytes Relative: 10 %
Neutro Abs: 1.8 10*3/uL (ref 1.7–7.7)
Neutrophils Relative %: 48 %
Platelets: 156 10*3/uL (ref 150–400)
RBC: 4.1 MIL/uL (ref 3.87–5.11)
RDW: 14.4 % (ref 11.5–15.5)
WBC: 3.8 10*3/uL — ABNORMAL LOW (ref 4.0–10.5)
nRBC: 0 % (ref 0.0–0.2)

## 2020-07-24 LAB — BASIC METABOLIC PANEL
Anion gap: 9 (ref 5–15)
BUN: 37 mg/dL — ABNORMAL HIGH (ref 6–20)
CO2: 28 mmol/L (ref 22–32)
Calcium: 9.6 mg/dL (ref 8.9–10.3)
Chloride: 102 mmol/L (ref 98–111)
Creatinine, Ser: 1.87 mg/dL — ABNORMAL HIGH (ref 0.44–1.00)
GFR calc Af Amer: 35 mL/min — ABNORMAL LOW (ref >60)
GFR calc non Af Amer: 30 mL/min — ABNORMAL LOW (ref >60)
Glucose, Bld: 63 mg/dL — ABNORMAL LOW (ref 70–99)
Potassium: 4.7 mmol/L (ref 3.5–5.1)
Sodium: 139 mmol/L (ref 135–145)

## 2020-07-24 LAB — MAGNESIUM: Magnesium: 1.9 mg/dL (ref 1.7–2.4)

## 2020-07-24 LAB — PHOSPHORUS: Phosphorus: 5.1 mg/dL — ABNORMAL HIGH (ref 2.5–4.6)

## 2020-07-27 LAB — TACROLIMUS LEVEL: Tacrolimus (FK506) - LabCorp: 4 ng/mL (ref 2.0–20.0)

## 2020-07-30 ENCOUNTER — Other Ambulatory Visit
Admission: RE | Admit: 2020-07-30 | Discharge: 2020-07-30 | Disposition: A | Discharge status: Home / Self Care | Payer: BC Managed Care – PPO | Attending: Nephrology | Admitting: Nephrology

## 2020-07-30 DIAGNOSIS — Z01812 Encounter for preprocedural laboratory examination: Secondary | ICD-10-CM | POA: Insufficient documentation

## 2020-07-30 DIAGNOSIS — Z79899 Other long term (current) drug therapy: Secondary | ICD-10-CM | POA: Insufficient documentation

## 2020-07-30 DIAGNOSIS — E559 Vitamin D deficiency, unspecified: Secondary | ICD-10-CM | POA: Diagnosis not present

## 2020-07-30 DIAGNOSIS — Z940 Kidney transplant status: Secondary | ICD-10-CM | POA: Diagnosis not present

## 2020-07-30 DIAGNOSIS — D899 Disorder involving the immune mechanism, unspecified: Secondary | ICD-10-CM | POA: Insufficient documentation

## 2020-07-30 DIAGNOSIS — Z114 Encounter for screening for human immunodeficiency virus [HIV]: Secondary | ICD-10-CM | POA: Insufficient documentation

## 2020-07-30 DIAGNOSIS — E1129 Type 2 diabetes mellitus with other diabetic kidney complication: Secondary | ICD-10-CM | POA: Insufficient documentation

## 2020-07-30 LAB — BASIC METABOLIC PANEL
Anion gap: 10 (ref 5–15)
BUN: 42 mg/dL — ABNORMAL HIGH (ref 6–20)
CO2: 26 mmol/L (ref 22–32)
Calcium: 9.7 mg/dL (ref 8.9–10.3)
Chloride: 100 mmol/L (ref 98–111)
Creatinine, Ser: 1.83 mg/dL — ABNORMAL HIGH (ref 0.44–1.00)
GFR calc Af Amer: 36 mL/min — ABNORMAL LOW (ref >60)
GFR calc non Af Amer: 31 mL/min — ABNORMAL LOW (ref >60)
Glucose, Bld: 154 mg/dL — ABNORMAL HIGH (ref 70–99)
Potassium: 4.7 mmol/L (ref 3.5–5.1)
Sodium: 136 mmol/L (ref 135–145)

## 2020-07-30 LAB — CBC WITH DIFFERENTIAL/PLATELET
Abs Immature Granulocytes: 0.06 10*3/uL (ref 0.00–0.07)
Basophils Absolute: 0 10*3/uL (ref 0.0–0.1)
Basophils Relative: 0 %
Eosinophils Absolute: 0.1 10*3/uL (ref 0.0–0.5)
Eosinophils Relative: 2 %
HCT: 33.7 % — ABNORMAL LOW (ref 36.0–46.0)
Hemoglobin: 11.5 g/dL — ABNORMAL LOW (ref 12.0–15.0)
Immature Granulocytes: 1 %
Lymphocytes Relative: 22 %
Lymphs Abs: 1.3 10*3/uL (ref 0.7–4.0)
MCH: 28.5 pg (ref 26.0–34.0)
MCHC: 34.1 g/dL (ref 30.0–36.0)
MCV: 83.4 fL (ref 80.0–100.0)
Monocytes Absolute: 0.5 10*3/uL (ref 0.1–1.0)
Monocytes Relative: 8 %
Neutro Abs: 3.8 10*3/uL (ref 1.7–7.7)
Neutrophils Relative %: 67 %
Platelets: 184 10*3/uL (ref 150–400)
RBC: 4.04 MIL/uL (ref 3.87–5.11)
RDW: 14.3 % (ref 11.5–15.5)
WBC: 5.8 10*3/uL (ref 4.0–10.5)
nRBC: 0 % (ref 0.0–0.2)

## 2020-07-30 LAB — PHOSPHORUS: Phosphorus: 4.2 mg/dL (ref 2.5–4.6)

## 2020-07-30 LAB — MAGNESIUM: Magnesium: 1.9 mg/dL (ref 1.7–2.4)

## 2020-08-01 LAB — TACROLIMUS LEVEL: Tacrolimus (FK506) - LabCorp: 4.8 ng/mL (ref 2.0–20.0)

## 2020-09-18 ENCOUNTER — Other Ambulatory Visit
Admission: RE | Admit: 2020-09-18 | Discharge: 2020-09-18 | Disposition: A | Discharge status: Home / Self Care | Payer: BC Managed Care – PPO | Attending: Nephrology | Admitting: Nephrology

## 2020-09-18 DIAGNOSIS — Z114 Encounter for screening for human immunodeficiency virus [HIV]: Secondary | ICD-10-CM | POA: Diagnosis not present

## 2020-09-18 DIAGNOSIS — B259 Cytomegaloviral disease, unspecified: Secondary | ICD-10-CM | POA: Diagnosis present

## 2020-09-18 DIAGNOSIS — Z940 Kidney transplant status: Secondary | ICD-10-CM | POA: Diagnosis not present

## 2020-09-18 DIAGNOSIS — N390 Urinary tract infection, site not specified: Secondary | ICD-10-CM | POA: Diagnosis not present

## 2020-09-18 DIAGNOSIS — Z79899 Other long term (current) drug therapy: Secondary | ICD-10-CM | POA: Diagnosis not present

## 2020-09-18 DIAGNOSIS — Z9483 Pancreas transplant status: Secondary | ICD-10-CM | POA: Insufficient documentation

## 2020-09-18 DIAGNOSIS — Z09 Encounter for follow-up examination after completed treatment for conditions other than malignant neoplasm: Secondary | ICD-10-CM | POA: Diagnosis not present

## 2020-09-18 DIAGNOSIS — E1129 Type 2 diabetes mellitus with other diabetic kidney complication: Secondary | ICD-10-CM | POA: Diagnosis not present

## 2020-09-18 DIAGNOSIS — Z789 Other specified health status: Secondary | ICD-10-CM | POA: Diagnosis not present

## 2020-09-18 DIAGNOSIS — E559 Vitamin D deficiency, unspecified: Secondary | ICD-10-CM | POA: Diagnosis not present

## 2020-09-18 DIAGNOSIS — D899 Disorder involving the immune mechanism, unspecified: Secondary | ICD-10-CM | POA: Insufficient documentation

## 2020-09-18 DIAGNOSIS — D631 Anemia in chronic kidney disease: Secondary | ICD-10-CM | POA: Diagnosis not present

## 2020-09-18 LAB — BASIC METABOLIC PANEL
Anion gap: 9 (ref 5–15)
BUN: 42 mg/dL — ABNORMAL HIGH (ref 6–20)
CO2: 27 mmol/L (ref 22–32)
Calcium: 9.4 mg/dL (ref 8.9–10.3)
Chloride: 104 mmol/L (ref 98–111)
Creatinine, Ser: 1.84 mg/dL — ABNORMAL HIGH (ref 0.44–1.00)
GFR, Estimated: 32 mL/min — ABNORMAL LOW (ref >60)
Glucose, Bld: 82 mg/dL (ref 70–99)
Potassium: 4.4 mmol/L (ref 3.5–5.1)
Sodium: 140 mmol/L (ref 135–145)

## 2020-09-18 LAB — CBC WITH DIFFERENTIAL/PLATELET
Abs Immature Granulocytes: 0.03 10*3/uL (ref 0.00–0.07)
Basophils Absolute: 0 10*3/uL (ref 0.0–0.1)
Basophils Relative: 0 %
Eosinophils Absolute: 0.1 10*3/uL (ref 0.0–0.5)
Eosinophils Relative: 2 %
HCT: 37.8 % (ref 36.0–46.0)
Hemoglobin: 12.1 g/dL (ref 12.0–15.0)
Immature Granulocytes: 1 %
Lymphocytes Relative: 20 %
Lymphs Abs: 1.1 10*3/uL (ref 0.7–4.0)
MCH: 27.5 pg (ref 26.0–34.0)
MCHC: 32 g/dL (ref 30.0–36.0)
MCV: 85.9 fL (ref 80.0–100.0)
Monocytes Absolute: 0.4 10*3/uL (ref 0.1–1.0)
Monocytes Relative: 8 %
Neutro Abs: 3.8 10*3/uL (ref 1.7–7.7)
Neutrophils Relative %: 69 %
Platelets: 184 10*3/uL (ref 150–400)
RBC: 4.4 MIL/uL (ref 3.87–5.11)
RDW: 14.2 % (ref 11.5–15.5)
WBC: 5.5 10*3/uL (ref 4.0–10.5)
nRBC: 0 % (ref 0.0–0.2)

## 2020-09-18 LAB — MAGNESIUM: Magnesium: 2 mg/dL (ref 1.7–2.4)

## 2020-09-18 LAB — PHOSPHORUS: Phosphorus: 4.6 mg/dL (ref 2.5–4.6)

## 2020-09-21 LAB — TACROLIMUS LEVEL: Tacrolimus (FK506) - LabCorp: 4 ng/mL (ref 2.0–20.0)

## 2020-12-11 ENCOUNTER — Encounter
Admission: RE | Admit: 2020-12-11 | Discharge: 2020-12-11 | Disposition: A | Discharge status: Home / Self Care | Payer: BC Managed Care – PPO | Attending: Nephrology | Admitting: Nephrology

## 2020-12-11 DIAGNOSIS — D631 Anemia in chronic kidney disease: Secondary | ICD-10-CM | POA: Insufficient documentation

## 2020-12-11 DIAGNOSIS — B259 Cytomegaloviral disease, unspecified: Secondary | ICD-10-CM | POA: Insufficient documentation

## 2020-12-11 DIAGNOSIS — E1129 Type 2 diabetes mellitus with other diabetic kidney complication: Secondary | ICD-10-CM | POA: Diagnosis not present

## 2020-12-11 DIAGNOSIS — E559 Vitamin D deficiency, unspecified: Secondary | ICD-10-CM | POA: Insufficient documentation

## 2020-12-11 DIAGNOSIS — Z9483 Pancreas transplant status: Secondary | ICD-10-CM | POA: Diagnosis not present

## 2020-12-11 DIAGNOSIS — Z940 Kidney transplant status: Secondary | ICD-10-CM | POA: Diagnosis not present

## 2020-12-11 DIAGNOSIS — Z789 Other specified health status: Secondary | ICD-10-CM | POA: Insufficient documentation

## 2020-12-11 DIAGNOSIS — N390 Urinary tract infection, site not specified: Secondary | ICD-10-CM | POA: Insufficient documentation

## 2020-12-11 DIAGNOSIS — Z09 Encounter for follow-up examination after completed treatment for conditions other than malignant neoplasm: Secondary | ICD-10-CM | POA: Insufficient documentation

## 2020-12-11 DIAGNOSIS — D899 Disorder involving the immune mechanism, unspecified: Secondary | ICD-10-CM | POA: Diagnosis not present

## 2020-12-11 DIAGNOSIS — Z79899 Other long term (current) drug therapy: Secondary | ICD-10-CM | POA: Insufficient documentation

## 2020-12-11 DIAGNOSIS — Z114 Encounter for screening for human immunodeficiency virus [HIV]: Secondary | ICD-10-CM | POA: Insufficient documentation

## 2020-12-11 LAB — CBC WITH DIFFERENTIAL/PLATELET
Abs Immature Granulocytes: 0.01 10*3/uL (ref 0.00–0.07)
Basophils Absolute: 0 10*3/uL (ref 0.0–0.1)
Basophils Relative: 1 %
Eosinophils Absolute: 0.1 10*3/uL (ref 0.0–0.5)
Eosinophils Relative: 3 %
HCT: 33.1 % — ABNORMAL LOW (ref 36.0–46.0)
Hemoglobin: 10.6 g/dL — ABNORMAL LOW (ref 12.0–15.0)
Immature Granulocytes: 0 %
Lymphocytes Relative: 34 %
Lymphs Abs: 1.5 10*3/uL (ref 0.7–4.0)
MCH: 27.4 pg (ref 26.0–34.0)
MCHC: 32 g/dL (ref 30.0–36.0)
MCV: 85.5 fL (ref 80.0–100.0)
Monocytes Absolute: 0.3 10*3/uL (ref 0.1–1.0)
Monocytes Relative: 8 %
Neutro Abs: 2.4 10*3/uL (ref 1.7–7.7)
Neutrophils Relative %: 54 %
Platelets: 190 10*3/uL (ref 150–400)
RBC: 3.87 MIL/uL (ref 3.87–5.11)
RDW: 13.5 % (ref 11.5–15.5)
WBC: 4.4 10*3/uL (ref 4.0–10.5)
nRBC: 0 % (ref 0.0–0.2)

## 2020-12-11 LAB — BASIC METABOLIC PANEL
Anion gap: 11 (ref 5–15)
BUN: 45 mg/dL — ABNORMAL HIGH (ref 6–20)
CO2: 23 mmol/L (ref 22–32)
Calcium: 9.5 mg/dL (ref 8.9–10.3)
Chloride: 105 mmol/L (ref 98–111)
Creatinine, Ser: 2.09 mg/dL — ABNORMAL HIGH (ref 0.44–1.00)
GFR, Estimated: 27 mL/min — ABNORMAL LOW (ref >60)
Glucose, Bld: 167 mg/dL — ABNORMAL HIGH (ref 70–99)
Potassium: 4.3 mmol/L (ref 3.5–5.1)
Sodium: 139 mmol/L (ref 135–145)

## 2020-12-11 LAB — PHOSPHORUS: Phosphorus: 4.2 mg/dL (ref 2.5–4.6)

## 2020-12-11 LAB — MAGNESIUM: Magnesium: 1.8 mg/dL (ref 1.7–2.4)

## 2020-12-16 IMAGING — MR MR HEAD W/O CM
12 series · 44 of 48 positions shown · non-contrast
Comparison: Brain MRI 01/04/2016

CLINICAL DATA: Left-sided facial numbness

EXAM:
MRI HEAD WITHOUT CONTRAST
TECHNIQUE: Multiplanar, multiecho pulse sequences of the brain and surrounding
structures were obtained without intravenous contrast.

[Series 5: ax dwi_tracew · axial · 3.0mm · 0.60mm/px · z∈[-111,+44]mm · 3 of 48 slices shown]
[im 1/48]
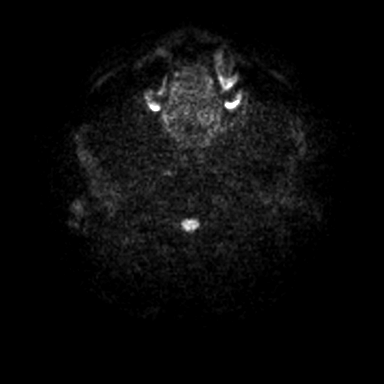
[im 24/48]
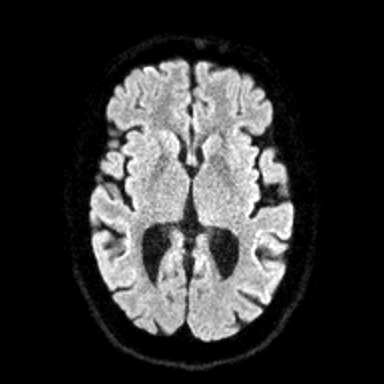
[im 48/48]
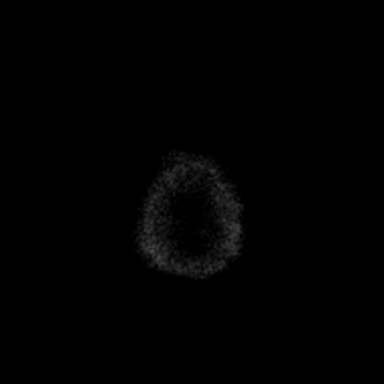

[Series 6: ax dwi_adc · axial · 3.0mm · 0.60mm/px · z∈[-111,+44]mm · 4 of 48 slices shown]
[im 1/48]
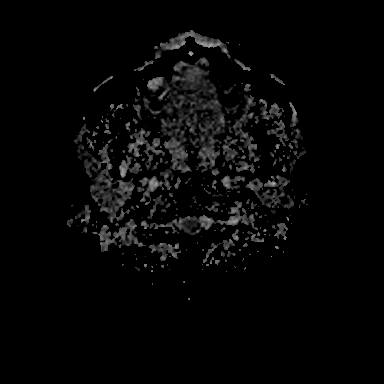
[im 16/48]
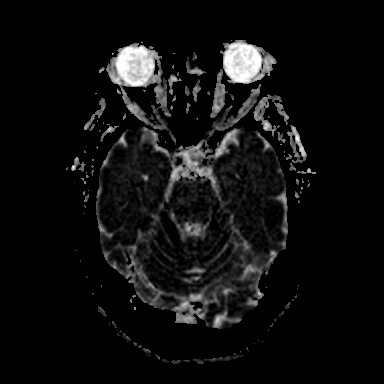
[im 32/48]
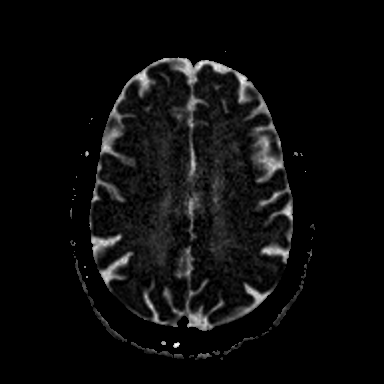
[im 48/48]
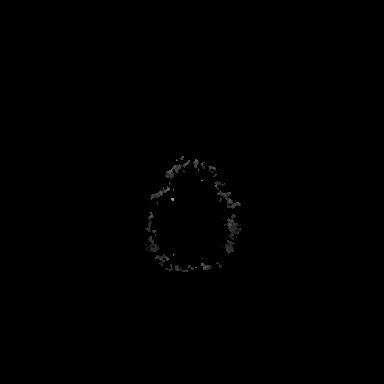

[Series 7: cor dwi_tracew · coronal · 5.0mm · 0.60mm/px · 3 of 38 slices shown]
[im 1/38]
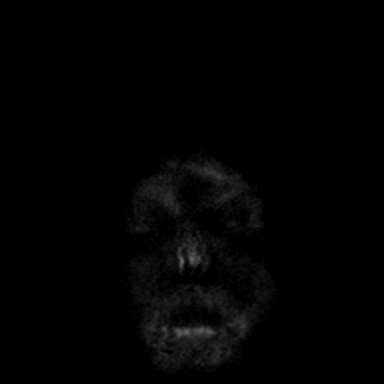
[im 19/38]
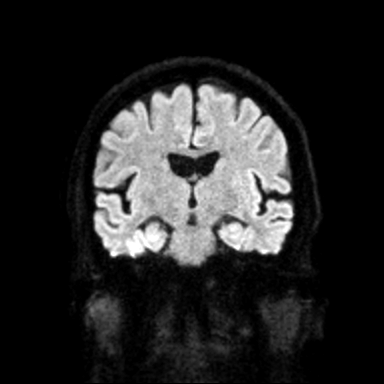
[im 38/38]
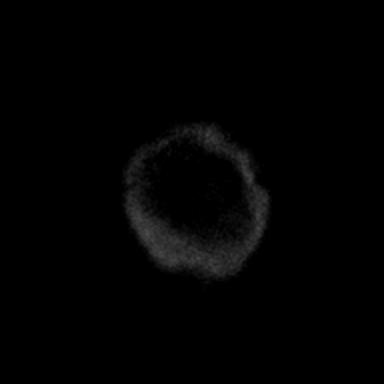

[Series 8: cor dwi_adc · coronal · 5.0mm · 0.60mm/px · 3 of 38 slices shown]
[im 1/38]
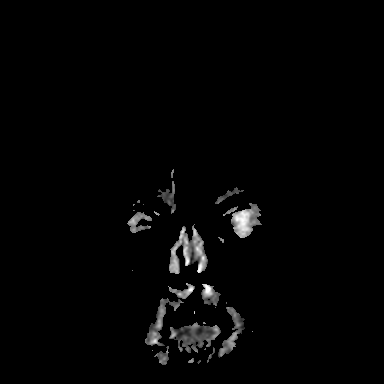
[im 19/38]
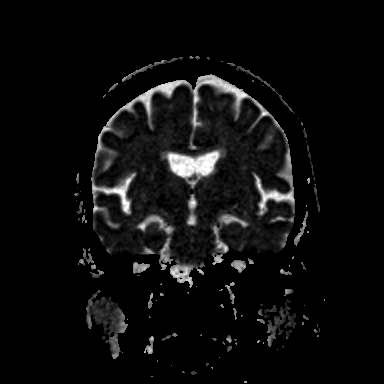
[im 38/38]
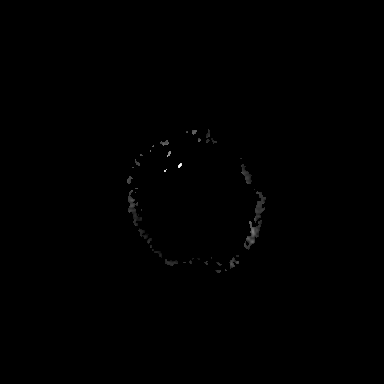

[Series 9: T1 · sagittal · 5.0mm · 0.62mm/px · 2 of 22 slices shown (1 of 2)]
[im 1/22]
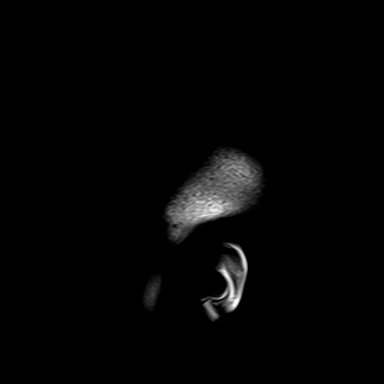
[im 22/22]
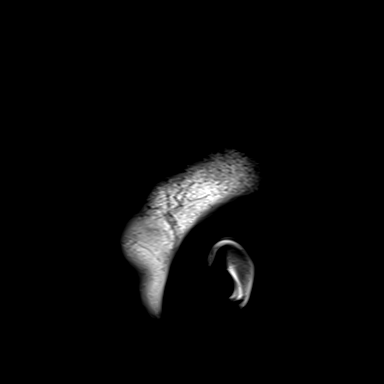

[Series 10: T2 · axial · 5.0mm · 0.53mm/px · z∈[-105,+44]mm · 2 of 26 slices shown (1 of 2)]
[im 1/26]
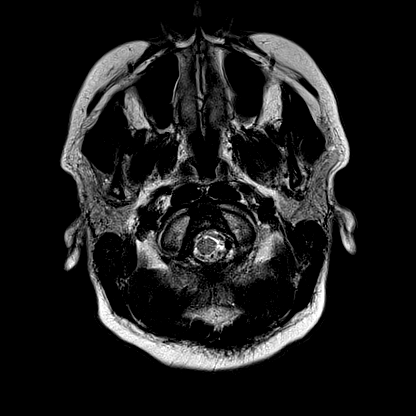
[im 26/26]
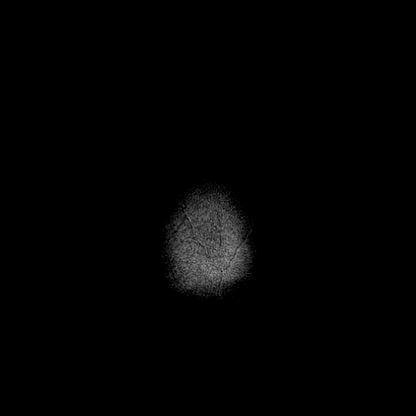

[Series 11: mag_images · axial · 3.0mm · 0.90mm/px · z∈[-119,+58]mm · 4 of 60 slices shown]
[im 1/60]
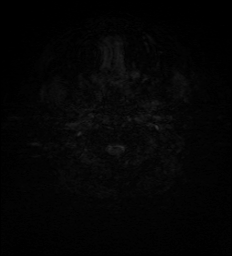
[im 20/60]
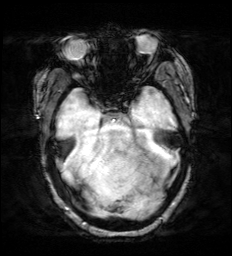
[im 40/60]
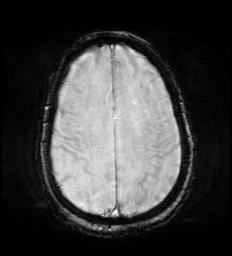
[im 60/60]
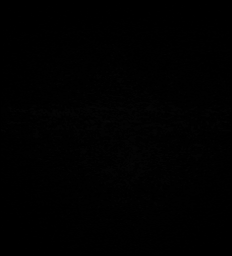

[Series 12: pha_images · axial · 3.0mm · 0.90mm/px · z∈[-119,+46]mm · 4 of 56 slices shown]
[im 1/56]
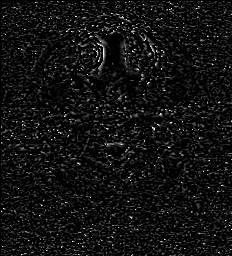
[im 19/56]
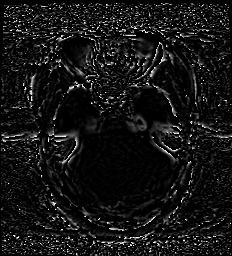
[im 37/56]
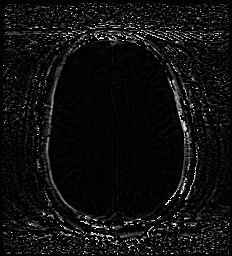
[im 56/56]
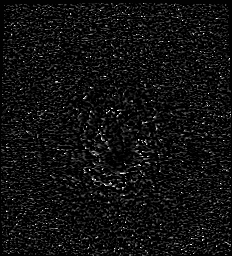

[Series 13: swi_images · axial · 3.0mm · 0.90mm/px · z∈[-119,+58]mm · 4 of 60 slices shown]
[im 1/60]
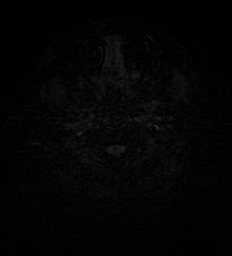
[im 20/60]
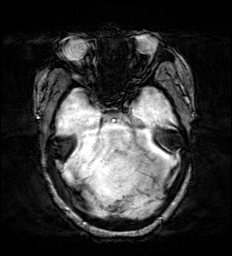
[im 40/60]
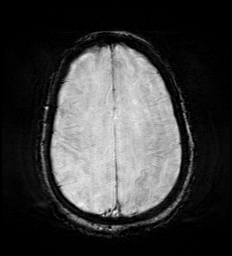
[im 60/60]
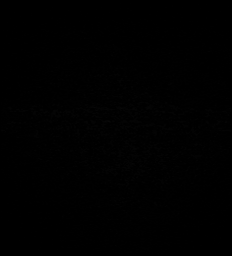

[Series 15: FLAIR · axial · 3.0mm · 0.53mm/px · z∈[-111,+50]mm · 4 of 55 slices shown]
[im 1/55]
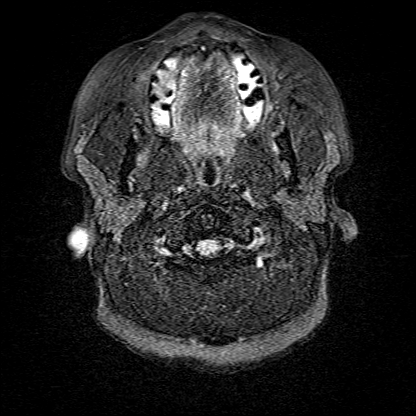
[im 19/55]
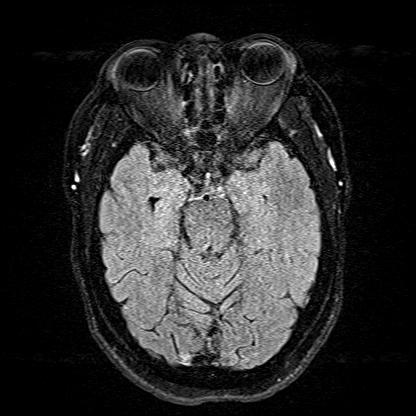
[im 37/55]
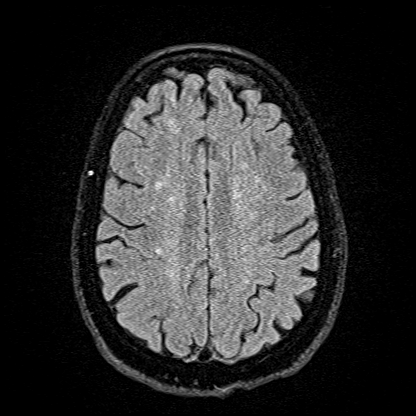
[im 55/55]
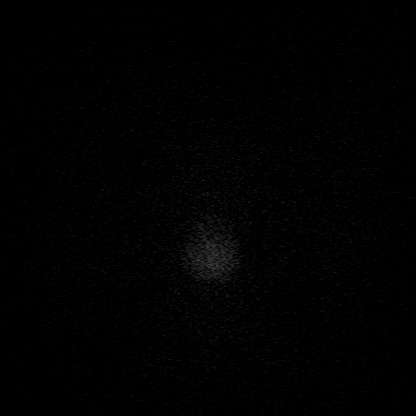

[Series 16: T1 · axial · 1.0mm · 0.98mm/px · z∈[-118,+56]mm · 9 of 176 slices shown (2 of 2)]
[im 1/176]
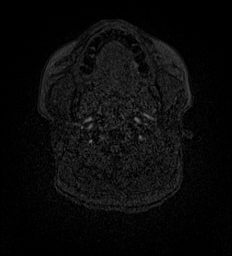
[im 15/176]
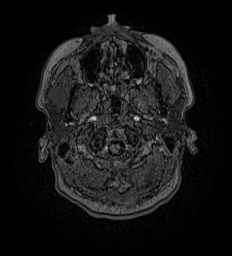
[im 30/176]
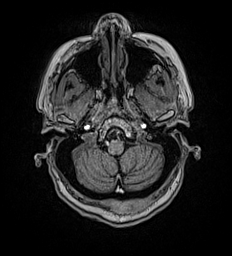
[im 59/176]
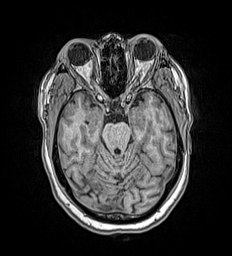
[im 73/176]
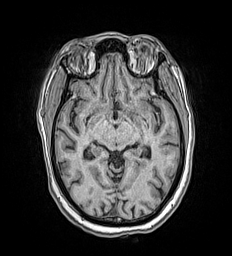
[im 103/176]
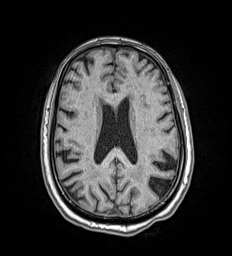
[im 117/176]
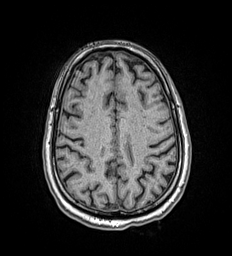
[im 146/176]
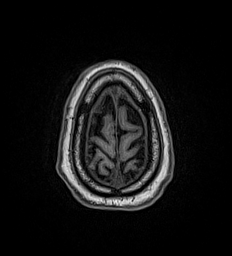
[im 176/176]
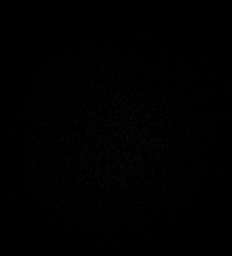

[Series 17: T2 · coronal · 5.0mm · 0.57mm/px · 2 of 29 slices shown (2 of 2)]
[im 1/29]
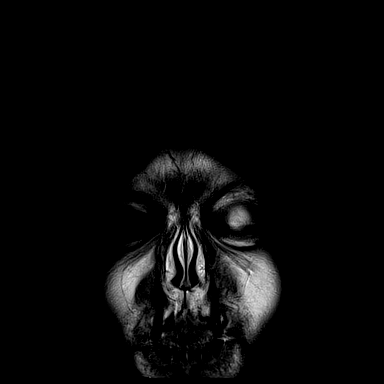
[im 29/29]
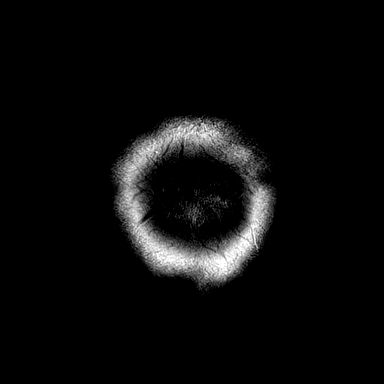

[44 of 48 positions shown; findings below may reference images not displayed]

FINDINGS: BRAIN: There is no acute infarct, acute hemorrhage or extra-axial
collection. Early confluent hyperintense T2-weighted signal of the
periventricular and deep white matter, most commonly due to chronic
ischemic microangiopathy. Advanced atrophy for age. The midline
structures are normal. There is an old infarct within the right pons
and multiple old, punctate cerebellar infarcts.

VASCULAR: The major intracranial arterial and venous sinus flow
voids are normal. Susceptibility-sensitive sequences show no chronic
microhemorrhage or superficial siderosis.

SKULL AND UPPER CERVICAL SPINE: Calvarial bone marrow signal is
normal. There is no skull base mass. The visualized upper cervical
spine and soft tissues are normal.

SINUSES/ORBITS: There are no fluid levels or advanced mucosal
thickening. The mastoid air cells and middle ear cavities are free
of fluid. The orbits are normal.
IMPRESSION: 1. No acute intracranial abnormality.
2. Advanced atrophy and moderate chronic small vessel disease.
3. Old right pontine and cerebellar infarcts.

## 2020-12-18 LAB — TACROLIMUS LEVEL: Tacrolimus (FK506) - LabCorp: 4.6 ng/mL (ref 2.0–20.0)

## 2021-03-05 ENCOUNTER — Other Ambulatory Visit
Admission: RE | Admit: 2021-03-05 | Discharge: 2021-03-05 | Disposition: A | Discharge status: Home / Self Care | Payer: BC Managed Care – PPO | Attending: Nephrology | Admitting: Nephrology

## 2021-03-05 DIAGNOSIS — E559 Vitamin D deficiency, unspecified: Secondary | ICD-10-CM | POA: Diagnosis not present

## 2021-03-05 DIAGNOSIS — Z114 Encounter for screening for human immunodeficiency virus [HIV]: Secondary | ICD-10-CM | POA: Diagnosis not present

## 2021-03-05 DIAGNOSIS — Z789 Other specified health status: Secondary | ICD-10-CM | POA: Diagnosis not present

## 2021-03-05 DIAGNOSIS — Z79899 Other long term (current) drug therapy: Secondary | ICD-10-CM | POA: Diagnosis not present

## 2021-03-05 DIAGNOSIS — Z940 Kidney transplant status: Secondary | ICD-10-CM | POA: Diagnosis not present

## 2021-03-05 DIAGNOSIS — E1129 Type 2 diabetes mellitus with other diabetic kidney complication: Secondary | ICD-10-CM | POA: Insufficient documentation

## 2021-03-05 DIAGNOSIS — Z9483 Pancreas transplant status: Secondary | ICD-10-CM | POA: Diagnosis not present

## 2021-03-05 DIAGNOSIS — D899 Disorder involving the immune mechanism, unspecified: Secondary | ICD-10-CM | POA: Insufficient documentation

## 2021-03-05 DIAGNOSIS — Z09 Encounter for follow-up examination after completed treatment for conditions other than malignant neoplasm: Secondary | ICD-10-CM | POA: Insufficient documentation

## 2021-03-05 DIAGNOSIS — N390 Urinary tract infection, site not specified: Secondary | ICD-10-CM | POA: Diagnosis not present

## 2021-03-05 DIAGNOSIS — D631 Anemia in chronic kidney disease: Secondary | ICD-10-CM | POA: Diagnosis not present

## 2021-03-05 DIAGNOSIS — B259 Cytomegaloviral disease, unspecified: Secondary | ICD-10-CM | POA: Insufficient documentation

## 2021-03-05 LAB — CBC WITH DIFFERENTIAL/PLATELET
Abs Immature Granulocytes: 0.02 10*3/uL (ref 0.00–0.07)
Basophils Absolute: 0 10*3/uL (ref 0.0–0.1)
Basophils Relative: 0 %
Eosinophils Absolute: 0.1 10*3/uL (ref 0.0–0.5)
Eosinophils Relative: 2 %
HCT: 31.8 % — ABNORMAL LOW (ref 36.0–46.0)
Hemoglobin: 10.2 g/dL — ABNORMAL LOW (ref 12.0–15.0)
Immature Granulocytes: 0 %
Lymphocytes Relative: 30 %
Lymphs Abs: 1.5 10*3/uL (ref 0.7–4.0)
MCH: 27.3 pg (ref 26.0–34.0)
MCHC: 32.1 g/dL (ref 30.0–36.0)
MCV: 85.3 fL (ref 80.0–100.0)
Monocytes Absolute: 0.3 10*3/uL (ref 0.1–1.0)
Monocytes Relative: 7 %
Neutro Abs: 3.1 10*3/uL (ref 1.7–7.7)
Neutrophils Relative %: 61 %
Platelets: 241 10*3/uL (ref 150–400)
RBC: 3.73 MIL/uL — ABNORMAL LOW (ref 3.87–5.11)
RDW: 14.2 % (ref 11.5–15.5)
WBC: 5 10*3/uL (ref 4.0–10.5)
nRBC: 0 % (ref 0.0–0.2)

## 2021-03-05 LAB — BASIC METABOLIC PANEL
Anion gap: 10 (ref 5–15)
BUN: 41 mg/dL — ABNORMAL HIGH (ref 6–20)
CO2: 24 mmol/L (ref 22–32)
Calcium: 9.2 mg/dL (ref 8.9–10.3)
Chloride: 101 mmol/L (ref 98–111)
Creatinine, Ser: 2.5 mg/dL — ABNORMAL HIGH (ref 0.44–1.00)
GFR, Estimated: 22 mL/min — ABNORMAL LOW (ref >60)
Glucose, Bld: 164 mg/dL — ABNORMAL HIGH (ref 70–99)
Potassium: 4.3 mmol/L (ref 3.5–5.1)
Sodium: 135 mmol/L (ref 135–145)

## 2021-03-05 LAB — MAGNESIUM: Magnesium: 1.7 mg/dL (ref 1.7–2.4)

## 2021-03-05 LAB — PHOSPHORUS: Phosphorus: 4.8 mg/dL — ABNORMAL HIGH (ref 2.5–4.6)

## 2021-03-09 LAB — TACROLIMUS LEVEL: Tacrolimus (FK506) - LabCorp: 4.7 ng/mL (ref 2.0–20.0)

## 2021-03-12 ENCOUNTER — Other Ambulatory Visit
Admission: RE | Admit: 2021-03-12 | Discharge: 2021-03-12 | Disposition: A | Discharge status: Home / Self Care | Payer: BC Managed Care – PPO | Attending: Nephrology | Admitting: Nephrology

## 2021-03-12 DIAGNOSIS — D631 Anemia in chronic kidney disease: Secondary | ICD-10-CM | POA: Insufficient documentation

## 2021-03-12 DIAGNOSIS — Z09 Encounter for follow-up examination after completed treatment for conditions other than malignant neoplasm: Secondary | ICD-10-CM | POA: Diagnosis present

## 2021-03-12 DIAGNOSIS — D899 Disorder involving the immune mechanism, unspecified: Secondary | ICD-10-CM | POA: Diagnosis not present

## 2021-03-12 DIAGNOSIS — B259 Cytomegaloviral disease, unspecified: Secondary | ICD-10-CM | POA: Insufficient documentation

## 2021-03-12 DIAGNOSIS — Z79899 Other long term (current) drug therapy: Secondary | ICD-10-CM | POA: Diagnosis not present

## 2021-03-12 DIAGNOSIS — E1129 Type 2 diabetes mellitus with other diabetic kidney complication: Secondary | ICD-10-CM | POA: Diagnosis not present

## 2021-03-12 DIAGNOSIS — Z789 Other specified health status: Secondary | ICD-10-CM | POA: Insufficient documentation

## 2021-03-12 DIAGNOSIS — Z940 Kidney transplant status: Secondary | ICD-10-CM | POA: Diagnosis not present

## 2021-03-12 DIAGNOSIS — N390 Urinary tract infection, site not specified: Secondary | ICD-10-CM | POA: Diagnosis not present

## 2021-03-12 DIAGNOSIS — Z9483 Pancreas transplant status: Secondary | ICD-10-CM | POA: Diagnosis not present

## 2021-03-12 LAB — BASIC METABOLIC PANEL
Anion gap: 10 (ref 5–15)
BUN: 37 mg/dL — ABNORMAL HIGH (ref 6–20)
CO2: 24 mmol/L (ref 22–32)
Calcium: 9.5 mg/dL (ref 8.9–10.3)
Chloride: 99 mmol/L (ref 98–111)
Creatinine, Ser: 1.77 mg/dL — ABNORMAL HIGH (ref 0.44–1.00)
GFR, Estimated: 34 mL/min — ABNORMAL LOW (ref >60)
Glucose, Bld: 119 mg/dL — ABNORMAL HIGH (ref 70–99)
Potassium: 4.5 mmol/L (ref 3.5–5.1)
Sodium: 133 mmol/L — ABNORMAL LOW (ref 135–145)

## 2021-03-12 LAB — CBC WITH DIFFERENTIAL/PLATELET
Abs Immature Granulocytes: 0.01 10*3/uL (ref 0.00–0.07)
Basophils Absolute: 0 10*3/uL (ref 0.0–0.1)
Basophils Relative: 0 %
Eosinophils Absolute: 0.1 10*3/uL (ref 0.0–0.5)
Eosinophils Relative: 3 %
HCT: 32.7 % — ABNORMAL LOW (ref 36.0–46.0)
Hemoglobin: 10.4 g/dL — ABNORMAL LOW (ref 12.0–15.0)
Immature Granulocytes: 0 %
Lymphocytes Relative: 31 %
Lymphs Abs: 1.5 10*3/uL (ref 0.7–4.0)
MCH: 27.3 pg (ref 26.0–34.0)
MCHC: 31.8 g/dL (ref 30.0–36.0)
MCV: 85.8 fL (ref 80.0–100.0)
Monocytes Absolute: 0.4 10*3/uL (ref 0.1–1.0)
Monocytes Relative: 8 %
Neutro Abs: 2.7 10*3/uL (ref 1.7–7.7)
Neutrophils Relative %: 58 %
Platelets: 214 10*3/uL (ref 150–400)
RBC: 3.81 MIL/uL — ABNORMAL LOW (ref 3.87–5.11)
RDW: 14.6 % (ref 11.5–15.5)
WBC: 4.8 10*3/uL (ref 4.0–10.5)
nRBC: 0 % (ref 0.0–0.2)

## 2021-03-12 LAB — PHOSPHORUS: Phosphorus: 4.3 mg/dL (ref 2.5–4.6)

## 2021-03-12 LAB — MAGNESIUM: Magnesium: 1.8 mg/dL (ref 1.7–2.4)

## 2021-03-15 LAB — TACROLIMUS LEVEL: Tacrolimus (FK506) - LabCorp: 6 ng/mL (ref 2.0–20.0)

## 2021-04-30 ENCOUNTER — Other Ambulatory Visit
Admission: RE | Admit: 2021-04-30 | Discharge: 2021-04-30 | Disposition: A | Discharge status: Home / Self Care | Payer: BC Managed Care – PPO | Attending: Nephrology | Admitting: Nephrology

## 2021-04-30 DIAGNOSIS — N390 Urinary tract infection, site not specified: Secondary | ICD-10-CM | POA: Insufficient documentation

## 2021-04-30 DIAGNOSIS — E559 Vitamin D deficiency, unspecified: Secondary | ICD-10-CM | POA: Insufficient documentation

## 2021-04-30 DIAGNOSIS — Z9483 Pancreas transplant status: Secondary | ICD-10-CM | POA: Diagnosis not present

## 2021-04-30 DIAGNOSIS — Z79899 Other long term (current) drug therapy: Secondary | ICD-10-CM | POA: Diagnosis not present

## 2021-04-30 DIAGNOSIS — D631 Anemia in chronic kidney disease: Secondary | ICD-10-CM | POA: Insufficient documentation

## 2021-04-30 DIAGNOSIS — Z789 Other specified health status: Secondary | ICD-10-CM | POA: Insufficient documentation

## 2021-04-30 DIAGNOSIS — B259 Cytomegaloviral disease, unspecified: Secondary | ICD-10-CM | POA: Diagnosis not present

## 2021-04-30 DIAGNOSIS — E1129 Type 2 diabetes mellitus with other diabetic kidney complication: Secondary | ICD-10-CM | POA: Insufficient documentation

## 2021-04-30 DIAGNOSIS — Z114 Encounter for screening for human immunodeficiency virus [HIV]: Secondary | ICD-10-CM | POA: Insufficient documentation

## 2021-04-30 DIAGNOSIS — Z940 Kidney transplant status: Secondary | ICD-10-CM | POA: Diagnosis not present

## 2021-04-30 DIAGNOSIS — Z09 Encounter for follow-up examination after completed treatment for conditions other than malignant neoplasm: Secondary | ICD-10-CM | POA: Diagnosis not present

## 2021-04-30 DIAGNOSIS — D899 Disorder involving the immune mechanism, unspecified: Secondary | ICD-10-CM | POA: Insufficient documentation

## 2021-04-30 LAB — CBC WITH DIFFERENTIAL/PLATELET
Abs Immature Granulocytes: 0.02 10*3/uL (ref 0.00–0.07)
Basophils Absolute: 0 10*3/uL (ref 0.0–0.1)
Basophils Relative: 0 %
Eosinophils Absolute: 0.1 10*3/uL (ref 0.0–0.5)
Eosinophils Relative: 1 %
HCT: 31.3 % — ABNORMAL LOW (ref 36.0–46.0)
Hemoglobin: 10 g/dL — ABNORMAL LOW (ref 12.0–15.0)
Immature Granulocytes: 0 %
Lymphocytes Relative: 23 %
Lymphs Abs: 1.7 10*3/uL (ref 0.7–4.0)
MCH: 27.5 pg (ref 26.0–34.0)
MCHC: 31.9 g/dL (ref 30.0–36.0)
MCV: 86.2 fL (ref 80.0–100.0)
Monocytes Absolute: 0.7 10*3/uL (ref 0.1–1.0)
Monocytes Relative: 10 %
Neutro Abs: 4.9 10*3/uL (ref 1.7–7.7)
Neutrophils Relative %: 66 %
Platelets: 147 10*3/uL — ABNORMAL LOW (ref 150–400)
RBC: 3.63 MIL/uL — ABNORMAL LOW (ref 3.87–5.11)
RDW: 14.3 % (ref 11.5–15.5)
WBC: 7.4 10*3/uL (ref 4.0–10.5)
nRBC: 0 % (ref 0.0–0.2)

## 2021-04-30 LAB — BASIC METABOLIC PANEL
Anion gap: 9 (ref 5–15)
BUN: 33 mg/dL — ABNORMAL HIGH (ref 6–20)
CO2: 25 mmol/L (ref 22–32)
Calcium: 8.9 mg/dL (ref 8.9–10.3)
Chloride: 98 mmol/L (ref 98–111)
Creatinine, Ser: 2.01 mg/dL — ABNORMAL HIGH (ref 0.44–1.00)
GFR, Estimated: 29 mL/min — ABNORMAL LOW (ref >60)
Glucose, Bld: 231 mg/dL — ABNORMAL HIGH (ref 70–99)
Potassium: 4.4 mmol/L (ref 3.5–5.1)
Sodium: 132 mmol/L — ABNORMAL LOW (ref 135–145)

## 2021-04-30 LAB — PHOSPHORUS: Phosphorus: 3.6 mg/dL (ref 2.5–4.6)

## 2021-04-30 LAB — MAGNESIUM: Magnesium: 1.8 mg/dL (ref 1.7–2.4)

## 2021-05-02 LAB — TACROLIMUS LEVEL: Tacrolimus (FK506) - LabCorp: 2.9 ng/mL (ref 2.0–20.0)

## 2021-05-28 ENCOUNTER — Other Ambulatory Visit
Admission: RE | Admit: 2021-05-28 | Discharge: 2021-05-28 | Disposition: A | Discharge status: Home / Self Care | Payer: BC Managed Care – PPO | Source: Ambulatory Visit | Attending: Nephrology | Admitting: Nephrology

## 2021-05-28 DIAGNOSIS — D899 Disorder involving the immune mechanism, unspecified: Secondary | ICD-10-CM | POA: Insufficient documentation

## 2021-05-28 DIAGNOSIS — N390 Urinary tract infection, site not specified: Secondary | ICD-10-CM | POA: Diagnosis not present

## 2021-05-28 DIAGNOSIS — D631 Anemia in chronic kidney disease: Secondary | ICD-10-CM | POA: Diagnosis not present

## 2021-05-28 DIAGNOSIS — Z9483 Pancreas transplant status: Secondary | ICD-10-CM | POA: Diagnosis not present

## 2021-05-28 DIAGNOSIS — B259 Cytomegaloviral disease, unspecified: Secondary | ICD-10-CM | POA: Insufficient documentation

## 2021-05-28 DIAGNOSIS — Z114 Encounter for screening for human immunodeficiency virus [HIV]: Secondary | ICD-10-CM | POA: Insufficient documentation

## 2021-05-28 DIAGNOSIS — Z940 Kidney transplant status: Secondary | ICD-10-CM | POA: Diagnosis not present

## 2021-05-28 DIAGNOSIS — E1129 Type 2 diabetes mellitus with other diabetic kidney complication: Secondary | ICD-10-CM | POA: Diagnosis not present

## 2021-05-28 DIAGNOSIS — Z79899 Other long term (current) drug therapy: Secondary | ICD-10-CM | POA: Insufficient documentation

## 2021-05-28 DIAGNOSIS — E559 Vitamin D deficiency, unspecified: Secondary | ICD-10-CM | POA: Diagnosis not present

## 2021-05-28 DIAGNOSIS — Z09 Encounter for follow-up examination after completed treatment for conditions other than malignant neoplasm: Secondary | ICD-10-CM | POA: Diagnosis present

## 2021-05-28 DIAGNOSIS — Z789 Other specified health status: Secondary | ICD-10-CM | POA: Insufficient documentation

## 2021-05-28 LAB — CBC WITH DIFFERENTIAL/PLATELET
Abs Immature Granulocytes: 0.02 10*3/uL (ref 0.00–0.07)
Basophils Absolute: 0 10*3/uL (ref 0.0–0.1)
Basophils Relative: 0 %
Eosinophils Absolute: 0.1 10*3/uL (ref 0.0–0.5)
Eosinophils Relative: 2 %
HCT: 35.4 % — ABNORMAL LOW (ref 36.0–46.0)
Hemoglobin: 11.4 g/dL — ABNORMAL LOW (ref 12.0–15.0)
Immature Granulocytes: 0 %
Lymphocytes Relative: 22 %
Lymphs Abs: 1.5 10*3/uL (ref 0.7–4.0)
MCH: 28 pg (ref 26.0–34.0)
MCHC: 32.2 g/dL (ref 30.0–36.0)
MCV: 87 fL (ref 80.0–100.0)
Monocytes Absolute: 0.4 10*3/uL (ref 0.1–1.0)
Monocytes Relative: 6 %
Neutro Abs: 4.8 10*3/uL (ref 1.7–7.7)
Neutrophils Relative %: 70 %
Platelets: 177 10*3/uL (ref 150–400)
RBC: 4.07 MIL/uL (ref 3.87–5.11)
RDW: 14 % (ref 11.5–15.5)
WBC: 6.9 10*3/uL (ref 4.0–10.5)
nRBC: 0 % (ref 0.0–0.2)

## 2021-05-28 LAB — BASIC METABOLIC PANEL
Anion gap: 11 (ref 5–15)
BUN: 44 mg/dL — ABNORMAL HIGH (ref 6–20)
CO2: 24 mmol/L (ref 22–32)
Calcium: 9.3 mg/dL (ref 8.9–10.3)
Chloride: 99 mmol/L (ref 98–111)
Creatinine, Ser: 2.21 mg/dL — ABNORMAL HIGH (ref 0.44–1.00)
GFR, Estimated: 26 mL/min — ABNORMAL LOW (ref >60)
Glucose, Bld: 146 mg/dL — ABNORMAL HIGH (ref 70–99)
Potassium: 4.5 mmol/L (ref 3.5–5.1)
Sodium: 134 mmol/L — ABNORMAL LOW (ref 135–145)

## 2021-05-28 LAB — MAGNESIUM: Magnesium: 1.8 mg/dL (ref 1.7–2.4)

## 2021-05-28 LAB — PHOSPHORUS: Phosphorus: 4.3 mg/dL (ref 2.5–4.6)

## 2021-05-30 LAB — TACROLIMUS LEVEL: Tacrolimus (FK506) - LabCorp: 5 ng/mL (ref 2.0–20.0)

## 2021-06-22 ENCOUNTER — Encounter: Payer: Self-pay | Admitting: Internal Medicine

## 2021-06-22 ENCOUNTER — Ambulatory Visit: Payer: BC Managed Care – PPO | Admitting: Internal Medicine

## 2021-06-22 ENCOUNTER — Other Ambulatory Visit: Payer: Self-pay

## 2021-06-22 ENCOUNTER — Emergency Department
Admission: EM | Admit: 2021-06-22 | Discharge: 2021-06-22 | Disposition: A | Discharge status: Home / Self Care | Payer: BC Managed Care – PPO | Attending: Emergency Medicine | Admitting: Emergency Medicine

## 2021-06-22 VITALS — BP 160/80 | HR 75 | Ht 63.0 in | Wt 158.0 lb

## 2021-06-22 DIAGNOSIS — Z5321 Procedure and treatment not carried out due to patient leaving prior to being seen by health care provider: Secondary | ICD-10-CM | POA: Diagnosis not present

## 2021-06-22 DIAGNOSIS — R079 Chest pain, unspecified: Secondary | ICD-10-CM | POA: Diagnosis not present

## 2021-06-22 DIAGNOSIS — R5383 Other fatigue: Secondary | ICD-10-CM | POA: Insufficient documentation

## 2021-06-22 DIAGNOSIS — M79602 Pain in left arm: Secondary | ICD-10-CM | POA: Diagnosis not present

## 2021-06-22 DIAGNOSIS — E162 Hypoglycemia, unspecified: Secondary | ICD-10-CM

## 2021-06-22 DIAGNOSIS — E108 Type 1 diabetes mellitus with unspecified complications: Secondary | ICD-10-CM | POA: Insufficient documentation

## 2021-06-22 LAB — CBG MONITORING, ED: Glucose-Capillary: 175 mg/dL — ABNORMAL HIGH (ref 70–99)

## 2021-06-22 NOTE — Progress Notes (Signed)
Patient in room barely responsive. Cool, clammy, and blood sugar was 26. Dr. Saunders Revel disabled her insulin pump. Gave her glucose tablet to chew but she was not alert enough so IV started in right forearm by Dr. Saunders Revel and one amp of D50 administered  by Dr. Quentin Ore. Patient then started becoming more alert and recheck 246. Blood pressure was checked and was in the 140/70 range. Once she was more alert she was taken to ED for further evaluation and Charge nurse was called to notify they were on the way.

## 2021-06-22 NOTE — Progress Notes (Signed)
New Outpatient Visit Date: 06/22/2021  Referring Provider: Coolidge Breeze, [address] [place],  [place] [address]  Chief Complaint: Blood pressure  HPI:  Brandi Carroll is a 55 y.o. female who is being seen today for the evaluation of chest and left arm pain at the request of Coolidge Breeze, FNP. She has a history of hypertension, hyperlipidemia, stroke, type 1 diabetes mellitus, chronic kidney disease s/p renal transplant, GERD, and Bell's palsy.  Patient is unable to provide any meaningful history.  When asked if she has chest pain or any other symptoms, she perseverates about blood pressure.  She has a hard time initiating thoughts and also finding certain words.  She states that her husband is here, though he was not available in our waiting room nor did he answer his phone when called 4 times.  --------------------------------------------------------------------------------------------------  Cardiovascular History & Procedures: Cardiovascular Problems: Possible chest pain  Risk Factors: Hypertension, hyperlipidemia, diabetes, and stroke  Cath/PCI:  None  CV Surgery: None  EP Procedures and Devices: None  Non-Invasive Evaluation(s): TTE (04/20/2015, [place] [facility]): Normal biventricular systolic function.  Mild mitral and moderate tricuspid regurgitation.  Recent CV Pertinent Labs: Lab Results  Component Value Date   CHOL 160 10/19/2018   CHOL 145 01/30/2015   HDL 74 10/19/2018   HDL 57 01/30/2015   LDLCALC 77 10/19/2018   LDLCALC 75 01/30/2015   LDLDIRECT 74 07/13/2018   TRIG 43 10/19/2018   TRIG 65 01/30/2015   CHOLHDL 2.2 10/19/2018   K 4.5 05/28/2021   K 4.3 01/30/2015   MG 1.8 05/28/2021   MG 1.4 (L) 01/30/2015   BUN 44 (H) 05/28/2021   BUN 38 (H) 01/30/2015   CREATININE 2.21 (H) 05/28/2021   CREATININE 1.76 (H) 01/30/2015    --------------------------------------------------------------------------------------------------  Past Medical History:   Diagnosis Date   Chronic kidney disease    Chronic sinusitis    Diabetes mellitus without complication (HCC)    Z1I, 7.0 last check; type 1, has insulin pump; subsequently has neuropathy;    Diabetic neuropathy (HCC)    GERD (gastroesophageal reflux disease)    Hyperlipemia    Hypertension    reports it goes either too high or too low; taking lisinopril but hasn't been regulated.   Hyperthyroidism    Insulin pump in place    Nausea and vomiting    Obesity    Osteoporosis    Stroke [facility])    Tremor     Past Surgical History:  Procedure Laterality Date   CATARACT EXTRACTION     COLONOSCOPY WITH PROPOFOL N/A 10/19/2016   Procedure: COLONOSCOPY WITH PROPOFOL;  Surgeon: Lollie Sails, MD;  Location: [facility] ENDOSCOPY;  Service: Endoscopy;  Laterality: N/A;   EYE SURGERY     bilateral (cataracts)   FOOT SURGERY     FOOT SURGERY     KIDNEY TRANSPLANT  2007    Current Meds  Medication Sig   aspirin 81 MG tablet Take 81 mg by mouth daily.   atorvastatin (LIPITOR) 40 MG tablet Take 40 mg by mouth daily.   Azelastine HCl 137 MCG/SPRAY SOLN Place 1 spray into the nose 2 (two) times daily.   Biotin 1 MG CAPS Take by mouth daily.   Cholecalciferol (VITAMIN D3) 2000 UNITS CHEW Chew by mouth daily.   cyanocobalamin 1000 MCG tablet Take 1,000 mcg by mouth daily.   fexofenadine (ALLEGRA) 180 MG tablet Take 180 mg by mouth daily.   glucagon 1 MG injection Inject  1 mg into the vein once as needed.   lisinopril (PRINIVIL,ZESTRIL) 5 MG tablet Take 5 mg by mouth daily.   montelukast (SINGULAIR) 10 MG tablet Take 10 mg by mouth at bedtime.   mycophenolate (CELLCEPT) 250 MG capsule Take by mouth 2 (two) times daily.   tacrolimus (PROGRAF) 1 MG capsule Take 6 mg by mouth daily.    Allergies: Erythromycin, Floxin [ofloxacin], Latex, Levaquin [levofloxacin in d5w], Moxifloxacin, Sulfa antibiotics, and Tape  Social History   Tobacco Use   Smoking status: Never   Smokeless tobacco: Never   Vaping Use   Vaping Use: Never used  Substance Use Topics   Alcohol use: No   Drug use: No    Family History  Problem Relation Age of Onset   Parkinson's disease Mother    Breast cancer Maternal Aunt        in lymph nodes    Review of Systems: A 12-system review of systems was performed and was negative except as noted in the HPI.  --------------------------------------------------------------------------------------------------  Physical Exam: BP (!) 160/80 (BP Location: Right Arm, Patient Position: Sitting, Cuff Size: Normal)   Pulse 75   Ht 5\' 3"  (1.6 m)   Wt 158 lb (71.7 kg)   SpO2 98%   BMI 27.99 kg/m   General: Initially responsive woman though difficulty finding words and completing sentences. HEENT: No conjunctival pallor or scleral icterus. Facemask in place. Neck: Supple without lymphadenopathy, thyromegaly, JVD, or HJR. Lungs: Normal work of breathing. Clear to auscultation bilaterally without wheezes or crackles. Heart: Regular rate and rhythm without murmurs, rubs, or gallops. Non-displaced PMI. Abd: Bowel sounds present. Soft, NT/ND without hepatosplenomegaly Ext: No lower extremity edema. Radial, PT, and DP pulses are 2+ bilaterally Skin: Diaphoretic.  No obvious rash. Neuro: Somnolent.  Patient moves all 4 extremities but does not reliably follow commands well enough to assess strength. Psych: Unable to assess due to somnolence  EKG: Sinus rhythm without significant abnormality  Lab Results  Component Value Date   WBC 6.9 05/28/2021   HGB 11.4 (L) 05/28/2021   HCT 35.4 (L) 05/28/2021   MCV 87.0 05/28/2021   PLT 177 05/28/2021    Lab Results  Component Value Date   NA 134 (L) 05/28/2021   K 4.5 05/28/2021   CL 99 05/28/2021   CO2 24 05/28/2021   BUN 44 (H) 05/28/2021   CREATININE 2.21 (H) 05/28/2021   GLUCOSE 146 (H) 05/28/2021   ALT 19 10/19/2018    Lab Results  Component Value Date   CHOL 160 10/19/2018   HDL 74 10/19/2018   LDLCALC  77 10/19/2018   LDLDIRECT 74 07/13/2018   TRIG 43 10/19/2018   CHOLHDL 2.2 10/19/2018     --------------------------------------------------------------------------------------------------  ASSESSMENT AND PLAN: Chest and left arm pain: Unable to obtain meaningful history from the patient.  No records from referring provider are available.  EKG does not show any acute abnormality.  In the setting of altered mental status (see details below) further work-up will be deferred at this time.  Ultimately, patient will likely need pharmacologic MPI given her multiple cardiac risk factors, if her history is consistent with possible angina.  Lethargy and hypoglycemia associated with type 1 diabetes mellitus: Patient became progressively more lethargic and was not thinking coherently throughout my visit with her.  Initial concern was for possible stroke given word finding difficulty.  Initial plan was to transfer to the emergency department for urgent evaluation.  However, she became diaphoretic and less responsive in  the exam room.  Glucose was checked and found to be 26.  Her insulin pump was disabled.  She was too somnolent to chew a glucose tablet.  I therefore personally placed a 20-gauge IV in the right forearm.  She received 1 amp of D50 with rapid improvement in her mental status.  She was given additional juice to help prevent recurrent hypoglycemia.  She has been transferred to the emergency department for further evaluation as to why she became so profoundly hypoglycemic (it is unclear if she had missed a meal or has some other systemic process contributing to this).  Chronic kidney disease status post kidney transplant: Avoid nephrotoxic agents.  Ongoing management per nephrology.  Hypertension: Blood pressure suboptimally controlled today but in the setting of acute distress from progressive hypoglycemia.  Defer medication changes today with plan to readdress at follow-up.  Follow-up: Return  to clinic at earliest convenience to reevaluate chest and left arm pain if not worked up in the ED.  Nelva Bush, MD 06/22/2021 3:39 PM

## 2021-06-22 NOTE — ED Triage Notes (Signed)
Pt to ED states she was at Dr appt, got hypoglycemic, cbg 26. States she ate today but must have bolused insulin pump wrong   Pt alert and oriented.

## 2021-06-24 ENCOUNTER — Telehealth: Payer: Self-pay | Admitting: *Deleted

## 2021-06-24 NOTE — Telephone Encounter (Signed)
-----   Message from Nelva Bush, MD sent at 06/24/2021  7:12 AM EDT ----- Regarding: Follow-up visit Please arrange for Ms. Kanzler to follow-up with me or an APP at her earliest convenience to reassess her chest and left arm pain that she was referred to [place] for, as we were unable to evaluate this during her visit this week due to her episode of severe hypoglycemia.  Notably, it appears that she left the emergency department without being seen by a provider.  I recommend that she contact her endocrinologist as soon as possible to help evaluate for potential causes of her severe hypoglycemic event, which could have been life-threatening had not been recognized promptly in our office.  Nelva Bush, MD [facility]

## 2021-06-24 NOTE — Telephone Encounter (Signed)
Spoke with pt and discussed Dr. Darnelle Bos recommendations below.  Pt verbalized understanding.  Pt does confirm that she is contacting her endocrinologist, Dr. Manfred Shirts in [place].  She is also scheduled for follow up in our office 08/11/21.

## 2021-07-09 ENCOUNTER — Other Ambulatory Visit
Admission: RE | Admit: 2021-07-09 | Discharge: 2021-07-09 | Disposition: A | Discharge status: Home / Self Care | Payer: BC Managed Care – PPO | Attending: Nephrology | Admitting: Nephrology

## 2021-07-09 DIAGNOSIS — B259 Cytomegaloviral disease, unspecified: Secondary | ICD-10-CM | POA: Diagnosis not present

## 2021-07-09 DIAGNOSIS — Z79899 Other long term (current) drug therapy: Secondary | ICD-10-CM | POA: Diagnosis not present

## 2021-07-09 DIAGNOSIS — Z9483 Pancreas transplant status: Secondary | ICD-10-CM | POA: Insufficient documentation

## 2021-07-09 DIAGNOSIS — Z114 Encounter for screening for human immunodeficiency virus [HIV]: Secondary | ICD-10-CM | POA: Diagnosis not present

## 2021-07-09 DIAGNOSIS — E1129 Type 2 diabetes mellitus with other diabetic kidney complication: Secondary | ICD-10-CM | POA: Insufficient documentation

## 2021-07-09 DIAGNOSIS — Z789 Other specified health status: Secondary | ICD-10-CM | POA: Diagnosis not present

## 2021-07-09 DIAGNOSIS — Z09 Encounter for follow-up examination after completed treatment for conditions other than malignant neoplasm: Secondary | ICD-10-CM | POA: Diagnosis present

## 2021-07-09 DIAGNOSIS — N390 Urinary tract infection, site not specified: Secondary | ICD-10-CM | POA: Insufficient documentation

## 2021-07-09 DIAGNOSIS — E559 Vitamin D deficiency, unspecified: Secondary | ICD-10-CM | POA: Insufficient documentation

## 2021-07-09 DIAGNOSIS — Z940 Kidney transplant status: Secondary | ICD-10-CM | POA: Insufficient documentation

## 2021-07-09 DIAGNOSIS — D899 Disorder involving the immune mechanism, unspecified: Secondary | ICD-10-CM | POA: Insufficient documentation

## 2021-07-09 DIAGNOSIS — D631 Anemia in chronic kidney disease: Secondary | ICD-10-CM | POA: Diagnosis not present

## 2021-07-09 LAB — CBC WITH DIFFERENTIAL/PLATELET
Abs Immature Granulocytes: 0.02 10*3/uL (ref 0.00–0.07)
Basophils Absolute: 0 10*3/uL (ref 0.0–0.1)
Basophils Relative: 0 %
Eosinophils Absolute: 0.1 10*3/uL (ref 0.0–0.5)
Eosinophils Relative: 2 %
HCT: 34.1 % — ABNORMAL LOW (ref 36.0–46.0)
Hemoglobin: 10.9 g/dL — ABNORMAL LOW (ref 12.0–15.0)
Immature Granulocytes: 0 %
Lymphocytes Relative: 34 %
Lymphs Abs: 1.7 10*3/uL (ref 0.7–4.0)
MCH: 27 pg (ref 26.0–34.0)
MCHC: 32 g/dL (ref 30.0–36.0)
MCV: 84.6 fL (ref 80.0–100.0)
Monocytes Absolute: 0.3 10*3/uL (ref 0.1–1.0)
Monocytes Relative: 7 %
Neutro Abs: 2.8 10*3/uL (ref 1.7–7.7)
Neutrophils Relative %: 57 %
Platelets: 172 10*3/uL (ref 150–400)
RBC: 4.03 MIL/uL (ref 3.87–5.11)
RDW: 14 % (ref 11.5–15.5)
WBC: 4.9 10*3/uL (ref 4.0–10.5)
nRBC: 0 % (ref 0.0–0.2)

## 2021-07-09 LAB — BASIC METABOLIC PANEL
Anion gap: 9 (ref 5–15)
BUN: 45 mg/dL — ABNORMAL HIGH (ref 6–20)
CO2: 26 mmol/L (ref 22–32)
Calcium: 9.1 mg/dL (ref 8.9–10.3)
Chloride: 104 mmol/L (ref 98–111)
Creatinine, Ser: 1.95 mg/dL — ABNORMAL HIGH (ref 0.44–1.00)
GFR, Estimated: 30 mL/min — ABNORMAL LOW (ref >60)
Glucose, Bld: 148 mg/dL — ABNORMAL HIGH (ref 70–99)
Potassium: 4.2 mmol/L (ref 3.5–5.1)
Sodium: 139 mmol/L (ref 135–145)

## 2021-07-09 LAB — PHOSPHORUS: Phosphorus: 4.2 mg/dL (ref 2.5–4.6)

## 2021-07-09 LAB — MAGNESIUM: Magnesium: 1.8 mg/dL (ref 1.7–2.4)

## 2021-07-12 LAB — TACROLIMUS LEVEL: Tacrolimus (FK506) - LabCorp: 5.7 ng/mL (ref 2.0–20.0)

## 2021-08-11 ENCOUNTER — Ambulatory Visit: Payer: BC Managed Care – PPO | Admitting: Internal Medicine

## 2021-08-11 ENCOUNTER — Other Ambulatory Visit: Payer: Self-pay

## 2021-08-11 ENCOUNTER — Encounter: Payer: Self-pay | Admitting: Internal Medicine

## 2021-08-11 VITALS — BP 160/64 | HR 75 | Ht 63.0 in | Wt 162.0 lb

## 2021-08-11 DIAGNOSIS — E108 Type 1 diabetes mellitus with unspecified complications: Secondary | ICD-10-CM

## 2021-08-11 DIAGNOSIS — R079 Chest pain, unspecified: Secondary | ICD-10-CM

## 2021-08-11 DIAGNOSIS — Z940 Kidney transplant status: Secondary | ICD-10-CM

## 2021-08-11 DIAGNOSIS — N186 End stage renal disease: Secondary | ICD-10-CM

## 2021-08-11 DIAGNOSIS — I10 Essential (primary) hypertension: Secondary | ICD-10-CM | POA: Diagnosis not present

## 2021-08-11 DIAGNOSIS — R0609 Other forms of dyspnea: Secondary | ICD-10-CM

## 2021-08-11 DIAGNOSIS — R011 Cardiac murmur, unspecified: Secondary | ICD-10-CM | POA: Diagnosis not present

## 2021-08-11 NOTE — Patient Instructions (Signed)
Medication Instructions:  Your physician recommends that you continue on your current medications as directed. Please refer to the Current Medication list given to you today.    *If you need a refill on your cardiac medications before your next appointment, please call your pharmacy*   Lab Work: None ordered  If you have labs (blood work) drawn today and your tests are completely normal, you will receive your results only by: [place] (if you have MyChart) OR A paper copy in the mail If you have any lab test that is abnormal or we need to change your treatment, we will call you to review the results.   Testing/Procedures: Your physician has requested that you have an echocardiogram. Echocardiography is a painless test that uses sound waves to create images of your heart. It provides your doctor with information about the size and shape of your heart and how well your heart's chambers and valves are working. This procedure takes approximately one hour. There are no restrictions for this procedure.  Follow-Up: At [facility], you and your health needs are our priority.  As part of our continuing mission to provide you with exceptional heart care, we have created designated Provider Care Teams.  These Care Teams include your primary Cardiologist (physician) and Advanced Practice Providers (APPs -  Physician Assistants and Nurse Practitioners) who all work together to provide you with the care you need, when you need it.  We recommend signing up for the patient portal called "MyChart".  Sign up information is provided on this After Visit Summary.  MyChart is used to connect with patients for Virtual Visits (Telemedicine).  Patients are able to view lab/test results, encounter notes, upcoming appointments, etc.  Non-urgent messages can be sent to your provider as well.   To learn more about what you can do with MyChart, go to [email or web address].    Your next appointment:   3  month(s)  The format for your next appointment:   In Person  Provider:   You may see Dr. Harrell Gave End or one of the following Advanced Practice Providers on your designated Care Team:   Murray Hodgkins, NP Christell Faith, PA-C Marrianne Mood, PA-C Cadence Galena, [place]

## 2021-08-11 NOTE — Progress Notes (Signed)
Follow-up Outpatient Visit Date: 08/11/2021  Primary Care Provider: Derinda Late, MD [phone] [facility] [place] [address]  Chief Complaint: Follow-up chest and left arm pain  HPI:  Ms. Brandi Carroll is a 55 y.o. female with history of hypertension, hyperlipidemia, stroke, type 1 diabetes mellitus, chronic kidney disease status post renal transplant, GERD, and Bell's palsy, who presents for follow-up of chest and left arm pain.  I met her in mid August for evaluation of chest and left arm pain.  We are unable to complete the visit because Ms. Brandi Carroll became unresponsive in the office and was found to be severely hypoglycemic in the setting of having not eaten much and continued her basal insulin pump infusion.  She was given oral and IV glucose with prompt improvement.  She was taken to the emergency department for further evaluation though she departed before being seen by a provider.  Today, Ms. Brandi Carroll reports that she has been feeling well.  She denies further chest and left arm pain but has noticed occasional discomfort in the right arm.  She has chronic exertional dyspnea that has been stable for > 1 year.  She denies palpitations, lightheadedness, and edema.  She has been having problems with her left foot, which has limited her mobility.  Her blood sugar and blood pressure remain labile.  She saw her endocrinologist today; no changes were made to her insulin pump settings.  --------------------------------------------------------------------------------------------------  Cardiovascular History & Procedures: Cardiovascular Problems: Possible chest pain   Risk Factors: Hypertension, hyperlipidemia, diabetes, and stroke   Cath/PCI:  None   CV Surgery: None   EP Procedures and Devices: None   Non-Invasive Evaluation(s): TTE (04/20/2015, [place] [facility]): Normal biventricular systolic function.  Mild mitral and moderate tricuspid  regurgitation.  Recent CV Pertinent Labs: Lab Results  Component Value Date   CHOL 160 10/19/2018   CHOL 145 01/30/2015   HDL 74 10/19/2018   HDL 57 01/30/2015   LDLCALC 77 10/19/2018   LDLCALC 75 01/30/2015   LDLDIRECT 74 07/13/2018   TRIG 43 10/19/2018   TRIG 65 01/30/2015   CHOLHDL 2.2 10/19/2018   K 4.2 07/09/2021   K 4.3 01/30/2015   MG 1.8 07/09/2021   MG 1.4 (L) 01/30/2015   BUN 45 (H) 07/09/2021   BUN 38 (H) 01/30/2015   CREATININE 1.95 (H) 07/09/2021   CREATININE 1.76 (H) 01/30/2015    Past medical and surgical history were reviewed and updated in EPIC.  Current Meds  Medication Sig   aspirin 81 MG tablet Take 81 mg by mouth daily.   Azelastine HCl 137 MCG/SPRAY SOLN Place 1 spray into the nose 2 (two) times daily.   Biotin 1 MG CAPS Take by mouth daily.   Cholecalciferol (VITAMIN D3) 2000 UNITS CHEW Chew by mouth daily.   cyanocobalamin 1000 MCG tablet Take 1,000 mcg by mouth daily.   fexofenadine (ALLEGRA) 180 MG tablet Take 180 mg by mouth daily.   fluocinolone (SYNALAR) 0.01 % external solution Apply topically as needed.   glucagon 1 MG injection Inject 1 mg into the vein once as needed.   latanoprost (XALATAN) 0.005 % ophthalmic solution Place 1 drop into the right eye at bedtime.   lisinopril (PRINIVIL,ZESTRIL) 5 MG tablet Take 5 mg by mouth daily.   montelukast (SINGULAIR) 10 MG tablet Take 10 mg by mouth at bedtime.   mycophenolate (CELLCEPT) 250 MG capsule Take by mouth 2 (two) times daily.   rosuvastatin (CRESTOR)  5 MG tablet Take 5 mg by mouth daily.   tacrolimus (PROGRAF) 1 MG capsule Take 6 mg by mouth daily.   [DISCONTINUED] atorvastatin (LIPITOR) 40 MG tablet Take 40 mg by mouth daily.    Allergies: Erythromycin, Floxin [ofloxacin], Latex, Levaquin [levofloxacin in d5w], Moxifloxacin, Sulfa antibiotics, and Tape  Social History   Tobacco Use   Smoking status: Never   Smokeless tobacco: Never  Vaping Use   Vaping Use: Never used  Substance  Use Topics   Alcohol use: No   Drug use: No    Family History  Problem Relation Age of Onset   Parkinson's disease Mother    Breast cancer Maternal Aunt        in lymph nodes    Review of Systems: A 12-system review of systems was performed and was negative except as noted in the HPI.  --------------------------------------------------------------------------------------------------  Physical Exam: BP (!) 160/64 (BP Location: Left Arm, Patient Position: Sitting, Cuff Size: Normal)   Pulse 75   Ht '5\' 3"'  (1.6 m)   Wt 162 lb (73.5 kg)   SpO2 97%   BMI 28.70 kg/m   General:  NAD. Neck: No JVD or HJR. Lungs: Clear to auscultation bilaterally without wheezes or crackles. Heart: Regular rate and rhythm with 2/6 systolic murmur. Abdomen: Soft, nontender, nondistended. Extremities: No lower extremity edema.  EKG:  Normal sinus rhythm without abnormality.  Lab Results  Component Value Date   WBC 4.9 07/09/2021   HGB 10.9 (L) 07/09/2021   HCT 34.1 (L) 07/09/2021   MCV 84.6 07/09/2021   PLT 172 07/09/2021    Lab Results  Component Value Date   NA 139 07/09/2021   K 4.2 07/09/2021   CL 104 07/09/2021   CO2 26 07/09/2021   BUN 45 (H) 07/09/2021   CREATININE 1.95 (H) 07/09/2021   GLUCOSE 148 (H) 07/09/2021   ALT 19 10/19/2018    Lab Results  Component Value Date   CHOL 160 10/19/2018   HDL 74 10/19/2018   LDLCALC 77 10/19/2018   LDLDIRECT 74 07/13/2018   TRIG 43 10/19/2018   CHOLHDL 2.2 10/19/2018    --------------------------------------------------------------------------------------------------  ASSESSMENT AND PLAN: Chest and arm pain as well as dyspnea on exertion: Ms. Brandi Carroll denies further chest pain today but has still had mild right arm pain at times.  It is unclear if this could represent her anginal equivalent.  She has stable exertional dyspnea.  She is certainly at risk for silent ischemia given her long history of type 1 diabetes.  I have  recommended that we obtain an echocardiogram.  If this does not show evidence of a significant cardiomyopathy, we will proceed with pharmacologic myocardial perfusion stress test.  We will defer medication changes today.  Heart murmur: Noted on exam today with prior echo in 2016 showed mild-moderate MR and TR.  We will repeat an echo at Ms. Brandi Carroll's convenience.  Hypertension: BP moderately elevated today in the office but reportedly quite labile at home with some borderline hypotensive episodes.  We will defer medication changes today.  Type 1 diabetes mellitus: Continue close follow-up with endocrinology.  ESRD s/p kidney transplant: Continue immunosuppression and ongoing follow-up with Brandi Carroll transplant nephrology.  Avoid nephrotoxic agents, including IV contrast, if possible.  Shared Decision Making/Informed Consent  The risks [chest pain, shortness of breath, cardiac arrhythmias, dizziness, blood pressure fluctuations, myocardial infarction, stroke/transient ischemic attack, nausea, vomiting, allergic reaction, radiation exposure, metallic taste sensation and life-threatening complications (estimated to be 1 in 10,000)], benefits (  risk stratification, diagnosing coronary artery disease, treatment guidance) and alternatives of a nuclear stress test were discussed in detail with Ms. Brandi Carroll and she agrees to proceed.  Follow-up: Return to clinic in 3 months.  Nelva Bush, MD 08/11/2021 3:42 PM

## 2021-08-12 ENCOUNTER — Encounter: Payer: Self-pay | Admitting: Internal Medicine

## 2021-08-12 DIAGNOSIS — R011 Cardiac murmur, unspecified: Secondary | ICD-10-CM | POA: Insufficient documentation

## 2021-08-12 DIAGNOSIS — Z940 Kidney transplant status: Secondary | ICD-10-CM | POA: Insufficient documentation

## 2021-08-12 DIAGNOSIS — R0609 Other forms of dyspnea: Secondary | ICD-10-CM | POA: Insufficient documentation

## 2021-08-12 DIAGNOSIS — I10 Essential (primary) hypertension: Secondary | ICD-10-CM | POA: Insufficient documentation

## 2021-08-12 DIAGNOSIS — N186 End stage renal disease: Secondary | ICD-10-CM | POA: Insufficient documentation

## 2021-08-30 ENCOUNTER — Other Ambulatory Visit: Payer: Self-pay | Admitting: Family Medicine

## 2021-08-30 DIAGNOSIS — Z139 Encounter for screening, unspecified: Secondary | ICD-10-CM

## 2021-09-16 ENCOUNTER — Other Ambulatory Visit: Payer: BC Managed Care – PPO

## 2021-09-17 ENCOUNTER — Other Ambulatory Visit
Admission: RE | Admit: 2021-09-17 | Discharge: 2021-09-17 | Disposition: A | Discharge status: Home / Self Care | Payer: BC Managed Care – PPO | Attending: Nephrology | Admitting: Nephrology

## 2021-09-17 DIAGNOSIS — D631 Anemia in chronic kidney disease: Secondary | ICD-10-CM | POA: Insufficient documentation

## 2021-09-17 DIAGNOSIS — E1129 Type 2 diabetes mellitus with other diabetic kidney complication: Secondary | ICD-10-CM | POA: Insufficient documentation

## 2021-09-17 DIAGNOSIS — Z9483 Pancreas transplant status: Secondary | ICD-10-CM | POA: Insufficient documentation

## 2021-09-17 DIAGNOSIS — Z79899 Other long term (current) drug therapy: Secondary | ICD-10-CM | POA: Insufficient documentation

## 2021-09-17 DIAGNOSIS — D899 Disorder involving the immune mechanism, unspecified: Secondary | ICD-10-CM | POA: Diagnosis not present

## 2021-09-17 DIAGNOSIS — B259 Cytomegaloviral disease, unspecified: Secondary | ICD-10-CM | POA: Diagnosis not present

## 2021-09-17 DIAGNOSIS — N390 Urinary tract infection, site not specified: Secondary | ICD-10-CM | POA: Insufficient documentation

## 2021-09-17 DIAGNOSIS — Z09 Encounter for follow-up examination after completed treatment for conditions other than malignant neoplasm: Secondary | ICD-10-CM | POA: Diagnosis not present

## 2021-09-17 DIAGNOSIS — Z114 Encounter for screening for human immunodeficiency virus [HIV]: Secondary | ICD-10-CM | POA: Insufficient documentation

## 2021-09-17 DIAGNOSIS — Z940 Kidney transplant status: Secondary | ICD-10-CM | POA: Insufficient documentation

## 2021-09-17 DIAGNOSIS — Z789 Other specified health status: Secondary | ICD-10-CM | POA: Insufficient documentation

## 2021-09-17 DIAGNOSIS — E559 Vitamin D deficiency, unspecified: Secondary | ICD-10-CM | POA: Insufficient documentation

## 2021-09-17 LAB — PHOSPHORUS: Phosphorus: 3.7 mg/dL (ref 2.5–4.6)

## 2021-09-17 LAB — CBC WITH DIFFERENTIAL/PLATELET
Abs Immature Granulocytes: 0.01 10*3/uL (ref 0.00–0.07)
Basophils Absolute: 0 10*3/uL (ref 0.0–0.1)
Basophils Relative: 0 %
Eosinophils Absolute: 0.1 10*3/uL (ref 0.0–0.5)
Eosinophils Relative: 2 %
HCT: 33.1 % — ABNORMAL LOW (ref 36.0–46.0)
Hemoglobin: 10.4 g/dL — ABNORMAL LOW (ref 12.0–15.0)
Immature Granulocytes: 0 %
Lymphocytes Relative: 31 %
Lymphs Abs: 1.4 10*3/uL (ref 0.7–4.0)
MCH: 26.9 pg (ref 26.0–34.0)
MCHC: 31.4 g/dL (ref 30.0–36.0)
MCV: 85.8 fL (ref 80.0–100.0)
Monocytes Absolute: 0.4 10*3/uL (ref 0.1–1.0)
Monocytes Relative: 8 %
Neutro Abs: 2.7 10*3/uL (ref 1.7–7.7)
Neutrophils Relative %: 59 %
Platelets: 181 10*3/uL (ref 150–400)
RBC: 3.86 MIL/uL — ABNORMAL LOW (ref 3.87–5.11)
RDW: 14.2 % (ref 11.5–15.5)
WBC: 4.7 10*3/uL (ref 4.0–10.5)
nRBC: 0 % (ref 0.0–0.2)

## 2021-09-17 LAB — BASIC METABOLIC PANEL
Anion gap: 9 (ref 5–15)
BUN: 53 mg/dL — ABNORMAL HIGH (ref 6–20)
CO2: 24 mmol/L (ref 22–32)
Calcium: 9.2 mg/dL (ref 8.9–10.3)
Chloride: 101 mmol/L (ref 98–111)
Creatinine, Ser: 2.12 mg/dL — ABNORMAL HIGH (ref 0.44–1.00)
GFR, Estimated: 27 mL/min — ABNORMAL LOW (ref >60)
Glucose, Bld: 219 mg/dL — ABNORMAL HIGH (ref 70–99)
Potassium: 4.6 mmol/L (ref 3.5–5.1)
Sodium: 134 mmol/L — ABNORMAL LOW (ref 135–145)

## 2021-09-17 LAB — MAGNESIUM: Magnesium: 1.8 mg/dL (ref 1.7–2.4)

## 2021-09-19 LAB — TACROLIMUS LEVEL: Tacrolimus (FK506) - LabCorp: 6 ng/mL (ref 2.0–20.0)

## 2021-10-07 ENCOUNTER — Other Ambulatory Visit
Admission: RE | Admit: 2021-10-07 | Discharge: 2021-10-07 | Disposition: A | Discharge status: Home / Self Care | Payer: BC Managed Care – PPO | Attending: Nephrology | Admitting: Nephrology

## 2021-10-07 DIAGNOSIS — Z789 Other specified health status: Secondary | ICD-10-CM | POA: Diagnosis not present

## 2021-10-07 DIAGNOSIS — Z9483 Pancreas transplant status: Secondary | ICD-10-CM | POA: Diagnosis not present

## 2021-10-07 DIAGNOSIS — E1129 Type 2 diabetes mellitus with other diabetic kidney complication: Secondary | ICD-10-CM | POA: Diagnosis not present

## 2021-10-07 DIAGNOSIS — Z79899 Other long term (current) drug therapy: Secondary | ICD-10-CM | POA: Insufficient documentation

## 2021-10-07 DIAGNOSIS — N189 Chronic kidney disease, unspecified: Secondary | ICD-10-CM | POA: Diagnosis not present

## 2021-10-07 DIAGNOSIS — Z09 Encounter for follow-up examination after completed treatment for conditions other than malignant neoplasm: Secondary | ICD-10-CM | POA: Insufficient documentation

## 2021-10-07 DIAGNOSIS — N390 Urinary tract infection, site not specified: Secondary | ICD-10-CM | POA: Insufficient documentation

## 2021-10-07 DIAGNOSIS — E1122 Type 2 diabetes mellitus with diabetic chronic kidney disease: Secondary | ICD-10-CM | POA: Diagnosis not present

## 2021-10-07 DIAGNOSIS — B269 Mumps without complication: Secondary | ICD-10-CM | POA: Insufficient documentation

## 2021-10-07 DIAGNOSIS — D631 Anemia in chronic kidney disease: Secondary | ICD-10-CM | POA: Insufficient documentation

## 2021-10-07 DIAGNOSIS — D899 Disorder involving the immune mechanism, unspecified: Secondary | ICD-10-CM | POA: Diagnosis not present

## 2021-10-07 DIAGNOSIS — Z940 Kidney transplant status: Secondary | ICD-10-CM | POA: Diagnosis not present

## 2021-10-07 LAB — CBC WITH DIFFERENTIAL/PLATELET
Abs Immature Granulocytes: 0.01 10*3/uL (ref 0.00–0.07)
Basophils Absolute: 0 10*3/uL (ref 0.0–0.1)
Basophils Relative: 0 %
Eosinophils Absolute: 0.1 10*3/uL (ref 0.0–0.5)
Eosinophils Relative: 2 %
HCT: 34 % — ABNORMAL LOW (ref 36.0–46.0)
Hemoglobin: 10.7 g/dL — ABNORMAL LOW (ref 12.0–15.0)
Immature Granulocytes: 0 %
Lymphocytes Relative: 27 %
Lymphs Abs: 1.2 10*3/uL (ref 0.7–4.0)
MCH: 26.7 pg (ref 26.0–34.0)
MCHC: 31.5 g/dL (ref 30.0–36.0)
MCV: 84.8 fL (ref 80.0–100.0)
Monocytes Absolute: 0.4 10*3/uL (ref 0.1–1.0)
Monocytes Relative: 8 %
Neutro Abs: 3 10*3/uL (ref 1.7–7.7)
Neutrophils Relative %: 63 %
Platelets: 172 10*3/uL (ref 150–400)
RBC: 4.01 MIL/uL (ref 3.87–5.11)
RDW: 13.9 % (ref 11.5–15.5)
WBC: 4.7 10*3/uL (ref 4.0–10.5)
nRBC: 0 % (ref 0.0–0.2)

## 2021-10-07 LAB — BASIC METABOLIC PANEL
Anion gap: 6 (ref 5–15)
BUN: 50 mg/dL — ABNORMAL HIGH (ref 6–20)
CO2: 23 mmol/L (ref 22–32)
Calcium: 9.2 mg/dL (ref 8.9–10.3)
Chloride: 103 mmol/L (ref 98–111)
Creatinine, Ser: 2.06 mg/dL — ABNORMAL HIGH (ref 0.44–1.00)
GFR, Estimated: 28 mL/min — ABNORMAL LOW (ref >60)
Glucose, Bld: 210 mg/dL — ABNORMAL HIGH (ref 70–99)
Potassium: 5 mmol/L (ref 3.5–5.1)
Sodium: 132 mmol/L — ABNORMAL LOW (ref 135–145)

## 2021-10-07 LAB — MAGNESIUM: Magnesium: 1.8 mg/dL (ref 1.7–2.4)

## 2021-10-07 LAB — PHOSPHORUS: Phosphorus: 4 mg/dL (ref 2.5–4.6)

## 2021-10-10 LAB — TACROLIMUS LEVEL: Tacrolimus (FK506) - LabCorp: 3.9 ng/mL (ref 2.0–20.0)

## 2021-11-09 ENCOUNTER — Telehealth: Payer: Self-pay

## 2021-11-09 NOTE — Telephone Encounter (Signed)
L mom to schedule echocardiogram

## 2021-11-09 NOTE — Telephone Encounter (Signed)
-----   Message from Horton Finer sent at 11/09/2021 10:25 AM EST ----- Regarding: needs echo Scheduled for F/U appt on 11/17/20 but patient canceled echo due to being out of town. Please reschedule and push back appt until after completed. Thank you!

## 2021-11-17 ENCOUNTER — Ambulatory Visit: Payer: BC Managed Care – PPO | Admitting: Internal Medicine

## 2022-01-20 ENCOUNTER — Other Ambulatory Visit
Admission: RE | Admit: 2022-01-20 | Discharge: 2022-01-20 | Disposition: A | Discharge status: Home / Self Care | Payer: BC Managed Care – PPO | Source: Ambulatory Visit | Attending: Nephrology | Admitting: Nephrology

## 2022-01-20 DIAGNOSIS — Z9483 Pancreas transplant status: Secondary | ICD-10-CM | POA: Diagnosis not present

## 2022-01-20 DIAGNOSIS — D649 Anemia, unspecified: Secondary | ICD-10-CM | POA: Diagnosis not present

## 2022-01-20 DIAGNOSIS — N390 Urinary tract infection, site not specified: Secondary | ICD-10-CM | POA: Diagnosis not present

## 2022-01-20 DIAGNOSIS — Z940 Kidney transplant status: Secondary | ICD-10-CM | POA: Diagnosis not present

## 2022-01-20 DIAGNOSIS — B259 Cytomegaloviral disease, unspecified: Secondary | ICD-10-CM | POA: Insufficient documentation

## 2022-01-20 DIAGNOSIS — D899 Disorder involving the immune mechanism, unspecified: Secondary | ICD-10-CM | POA: Diagnosis not present

## 2022-01-20 DIAGNOSIS — Z09 Encounter for follow-up examination after completed treatment for conditions other than malignant neoplasm: Secondary | ICD-10-CM | POA: Insufficient documentation

## 2022-01-20 DIAGNOSIS — E119 Type 2 diabetes mellitus without complications: Secondary | ICD-10-CM | POA: Insufficient documentation

## 2022-01-20 DIAGNOSIS — Z789 Other specified health status: Secondary | ICD-10-CM | POA: Diagnosis not present

## 2022-01-20 DIAGNOSIS — Z79899 Other long term (current) drug therapy: Secondary | ICD-10-CM | POA: Diagnosis not present

## 2022-01-20 LAB — CBC WITH DIFFERENTIAL/PLATELET
Abs Immature Granulocytes: 0.01 K/uL (ref 0.00–0.07)
Basophils Absolute: 0 K/uL (ref 0.0–0.1)
Basophils Relative: 0 %
Eosinophils Absolute: 0.1 K/uL (ref 0.0–0.5)
Eosinophils Relative: 2 %
HCT: 33.7 % — ABNORMAL LOW (ref 36.0–46.0)
Hemoglobin: 10.6 g/dL — ABNORMAL LOW (ref 12.0–15.0)
Immature Granulocytes: 0 %
Lymphocytes Relative: 29 %
Lymphs Abs: 1.4 K/uL (ref 0.7–4.0)
MCH: 26.1 pg (ref 26.0–34.0)
MCHC: 31.5 g/dL (ref 30.0–36.0)
MCV: 83 fL (ref 80.0–100.0)
Monocytes Absolute: 0.4 K/uL (ref 0.1–1.0)
Monocytes Relative: 8 %
Neutro Abs: 3 K/uL (ref 1.7–7.7)
Neutrophils Relative %: 61 %
Platelets: 180 K/uL (ref 150–400)
RBC: 4.06 MIL/uL (ref 3.87–5.11)
RDW: 13.6 % (ref 11.5–15.5)
WBC: 4.9 K/uL (ref 4.0–10.5)
nRBC: 0 % (ref 0.0–0.2)

## 2022-01-20 LAB — BASIC METABOLIC PANEL WITH GFR
Anion gap: 10 (ref 5–15)
BUN: 37 mg/dL — ABNORMAL HIGH (ref 6–20)
CO2: 24 mmol/L (ref 22–32)
Calcium: 9.3 mg/dL (ref 8.9–10.3)
Chloride: 99 mmol/L (ref 98–111)
Creatinine, Ser: 2.39 mg/dL — ABNORMAL HIGH (ref 0.44–1.00)
GFR, Estimated: 23 mL/min — ABNORMAL LOW
Glucose, Bld: 191 mg/dL — ABNORMAL HIGH (ref 70–99)
Potassium: 4.5 mmol/L (ref 3.5–5.1)
Sodium: 133 mmol/L — ABNORMAL LOW (ref 135–145)

## 2022-01-20 LAB — PHOSPHORUS: Phosphorus: 3.6 mg/dL (ref 2.5–4.6)

## 2022-01-20 LAB — MAGNESIUM: Magnesium: 1.5 mg/dL — ABNORMAL LOW (ref 1.7–2.4)

## 2022-01-23 LAB — TACROLIMUS LEVEL: Tacrolimus (FK506) - LabCorp: 6.9 ng/mL (ref 2.0–20.0)

## 2022-03-10 ENCOUNTER — Encounter: Payer: Self-pay | Admitting: Internal Medicine

## 2022-03-10 NOTE — Progress Notes (Signed)
Unable to contact patient to schedule echocardiogram, letter sent, order cancelled. 

## 2022-04-07 ENCOUNTER — Other Ambulatory Visit
Admission: RE | Admit: 2022-04-07 | Discharge: 2022-04-07 | Disposition: A | Discharge status: Home / Self Care | Payer: BC Managed Care – PPO | Attending: Nephrology | Admitting: Nephrology

## 2022-04-07 DIAGNOSIS — B259 Cytomegaloviral disease, unspecified: Secondary | ICD-10-CM | POA: Diagnosis not present

## 2022-04-07 DIAGNOSIS — Z9483 Pancreas transplant status: Secondary | ICD-10-CM | POA: Diagnosis not present

## 2022-04-07 DIAGNOSIS — Z940 Kidney transplant status: Secondary | ICD-10-CM | POA: Diagnosis not present

## 2022-04-07 DIAGNOSIS — Y849 Medical procedure, unspecified as the cause of abnormal reaction of the patient, or of later complication, without mention of misadventure at the time of the procedure: Secondary | ICD-10-CM | POA: Diagnosis not present

## 2022-04-07 DIAGNOSIS — Z09 Encounter for follow-up examination after completed treatment for conditions other than malignant neoplasm: Secondary | ICD-10-CM | POA: Diagnosis not present

## 2022-04-07 DIAGNOSIS — T8610 Unspecified complication of kidney transplant: Secondary | ICD-10-CM | POA: Insufficient documentation

## 2022-04-07 DIAGNOSIS — D631 Anemia in chronic kidney disease: Secondary | ICD-10-CM | POA: Insufficient documentation

## 2022-04-07 DIAGNOSIS — D899 Disorder involving the immune mechanism, unspecified: Secondary | ICD-10-CM | POA: Insufficient documentation

## 2022-04-07 DIAGNOSIS — Z114 Encounter for screening for human immunodeficiency virus [HIV]: Secondary | ICD-10-CM | POA: Insufficient documentation

## 2022-04-07 DIAGNOSIS — Z79899 Other long term (current) drug therapy: Secondary | ICD-10-CM | POA: Insufficient documentation

## 2022-04-07 DIAGNOSIS — Z789 Other specified health status: Secondary | ICD-10-CM | POA: Insufficient documentation

## 2022-04-07 DIAGNOSIS — E559 Vitamin D deficiency, unspecified: Secondary | ICD-10-CM | POA: Diagnosis not present

## 2022-04-07 DIAGNOSIS — N390 Urinary tract infection, site not specified: Secondary | ICD-10-CM | POA: Insufficient documentation

## 2022-04-07 DIAGNOSIS — E1129 Type 2 diabetes mellitus with other diabetic kidney complication: Secondary | ICD-10-CM | POA: Diagnosis not present

## 2022-04-07 LAB — BASIC METABOLIC PANEL
Anion gap: 7 (ref 5–15)
BUN: 44 mg/dL — ABNORMAL HIGH (ref 6–20)
CO2: 25 mmol/L (ref 22–32)
Calcium: 9.5 mg/dL (ref 8.9–10.3)
Chloride: 106 mmol/L (ref 98–111)
Creatinine, Ser: 2.14 mg/dL — ABNORMAL HIGH (ref 0.44–1.00)
GFR, Estimated: 27 mL/min — ABNORMAL LOW (ref >60)
Glucose, Bld: 94 mg/dL (ref 70–99)
Potassium: 4.4 mmol/L (ref 3.5–5.1)
Sodium: 138 mmol/L (ref 135–145)

## 2022-04-07 LAB — PHOSPHORUS: Phosphorus: 4.4 mg/dL (ref 2.5–4.6)

## 2022-04-07 LAB — CBC WITH DIFFERENTIAL/PLATELET
Abs Immature Granulocytes: 0.01 10*3/uL (ref 0.00–0.07)
Basophils Absolute: 0 10*3/uL (ref 0.0–0.1)
Basophils Relative: 0 %
Eosinophils Absolute: 0.2 10*3/uL (ref 0.0–0.5)
Eosinophils Relative: 3 %
HCT: 32.1 % — ABNORMAL LOW (ref 36.0–46.0)
Hemoglobin: 9.9 g/dL — ABNORMAL LOW (ref 12.0–15.0)
Immature Granulocytes: 0 %
Lymphocytes Relative: 28 %
Lymphs Abs: 1.4 10*3/uL (ref 0.7–4.0)
MCH: 26.3 pg (ref 26.0–34.0)
MCHC: 30.8 g/dL (ref 30.0–36.0)
MCV: 85.1 fL (ref 80.0–100.0)
Monocytes Absolute: 0.5 10*3/uL (ref 0.1–1.0)
Monocytes Relative: 10 %
Neutro Abs: 2.8 10*3/uL (ref 1.7–7.7)
Neutrophils Relative %: 59 %
Platelets: 184 10*3/uL (ref 150–400)
RBC: 3.77 MIL/uL — ABNORMAL LOW (ref 3.87–5.11)
RDW: 14 % (ref 11.5–15.5)
WBC: 4.9 10*3/uL (ref 4.0–10.5)
nRBC: 0 % (ref 0.0–0.2)

## 2022-04-07 LAB — MAGNESIUM: Magnesium: 1.8 mg/dL (ref 1.7–2.4)

## 2022-04-10 LAB — TACROLIMUS LEVEL: Tacrolimus (FK506) - LabCorp: 3.4 ng/mL (ref 2.0–20.0)

## 2022-04-11 ENCOUNTER — Telehealth: Payer: Self-pay | Admitting: Internal Medicine

## 2022-04-11 NOTE — Telephone Encounter (Signed)
Reviewed chart and she was last seen on 08/11/2021 and recommendations were to follow up in 3 months. Scheduled her to come in and be seen with APP next week to review these concerns. We discussed blood pressure monitoring and instructed her to continue monitoring and bring log of those readings to appointment. She was appreciative for the call with no further questions at this time.

## 2022-04-11 NOTE — Telephone Encounter (Signed)
Pt c/o BP issue: STAT if pt c/o blurred vision, one-sided weakness or slurred speech  1. What are your last 5 BP readings? 145/80 154/77  2. Are you having any other symptoms (ex. Dizziness, headache, blurred vision, passed out)? No  3. What is your BP issue?  Pt states her bp isn't coming down with meds and she has an echo scheduled for 06/09 and she doesn't know if she needs to reschedule this due to not being able to lower her bp. Please advise.

## 2022-04-14 ENCOUNTER — Ambulatory Visit (INDEPENDENT_AMBULATORY_CARE_PROVIDER_SITE_OTHER): Payer: BC Managed Care – PPO

## 2022-04-14 DIAGNOSIS — R011 Cardiac murmur, unspecified: Secondary | ICD-10-CM | POA: Diagnosis not present

## 2022-04-14 DIAGNOSIS — R0609 Other forms of dyspnea: Secondary | ICD-10-CM | POA: Diagnosis not present

## 2022-04-14 LAB — ECHOCARDIOGRAM COMPLETE
AR max vel: 1.55 cm2
AV Area VTI: 1.71 cm2
AV Area mean vel: 1.67 cm2
AV Mean grad: 5 mmHg
AV Peak grad: 9.9 mmHg
Ao pk vel: 1.57 m/s
Area-P 1/2: 2.95 cm2
Calc EF: 52.5 %
MV VTI: 1.34 cm2
S' Lateral: 2.6 cm
Single Plane A2C EF: 51 %
Single Plane A4C EF: 54.4 %

## 2022-04-17 NOTE — Progress Notes (Signed)
Cardiology Office Note    Date:  04/18/2022   ID:  [name], [name] [birth year], MRN [id number]  PCP:  Derinda Late, MD  Cardiologist:  Nelva Bush, MD  Electrophysiologist:  None   Chief Complaint: Elevated BP  History of Present Illness:   Brandi Carroll is a 56 y.o. female with history of HTN, HLD, stroke, type 1 diabetes mellitus, CKD status post renal transplant, Bell's palsy, and GERD who presents for evaluation of elevated blood pressure.  She established care in our office in 06/2021 for evaluation of chest and left arm pain.  However, that visit was unable to be completed because she became unresponsive in the office and was found to be severely hypoglycemic in the setting of having not eaten much and continued her basal insulin pump infusion.  She was given oral and IV glucose with prompt improvement.  She was taken to the ED for further evaluation, though left without being seen by a provider.  She was last seen in our office in 08/2021 and was without further chest or left arm pain.  She did note occasional discomfort in the right arm.  Her chronic exertional dyspnea had been stable for greater than 1 year.  She continued to report labile blood sugar and blood pressure.  Echo was recommended with plans to pursue ischemic testing thereafter.  She contacted our office on 04/11/2022 noting BP readings in the 140s to 150s over 70s to 80s with recommendation to follow-up today.  Previously scheduled echo was performed on 04/14/2022, and demonstrated an EF of 60 to 65%, no regional wall motion abnormalities, grade 1 diastolic dysfunction, normal RV systolic function and ventricular cavity size, mildly elevated PASP estimated at 42.7 mmHg, mild mitral regurgitation, mild to moderate tricuspid regurgitation, and an estimated right atrial pressure of 8 mmHg.  She comes in today doing reasonably well.  She is without symptoms of angina or decompensation.  More recently, she has  noted elevated BP readings at home ranging from 117-193/62-88 with most readings in the 254Y to 706C systolic.  Historically, she has had low BP readings at home with possible history of whitecoat hypertension in medical practices.  In the setting of her elevated BP readings, she self titrated lisinopril to 20 mg daily approximately 3 weeks ago, though BP readings have remained elevated.  Her PCP did formally titrate her lisinopril to 20 mg daily earlier today.  She has also been under going fitting for a CPAP mask, though has been getting at least 4 hours of CPAP use.  She is waking up about every 2 hours to check her blood sugar.  She is under increased stress surrounding possible disability.  No episodes of syncope.   Labs independently reviewed: 04/2022 - Hgb 9.9, PLT 184, magnesium 1.8, potassium 4.4, BUN 44, serum creatinine 2.14 11/2021 - A1c 8.5, albumin 4.0, AST/ALT not elevated, TC 166, TG 84, HDL 71, LDL 78 07/2019 - TSH normal  Past Medical History:  Diagnosis Date   Chronic kidney disease    Chronic sinusitis    Diabetes mellitus without complication (HCC)    B7S, 7.0 last check; type 1, has insulin pump; subsequently has neuropathy;    Diabetic neuropathy (HCC)    GERD (gastroesophageal reflux disease)    Hyperlipemia    Hypertension    reports it goes either too high or too low; taking lisinopril but hasn't been regulated.   Hyperthyroidism    Insulin pump in place    Nausea  and vomiting    Obesity    Osteoporosis    Stroke [facility])    Tremor     Past Surgical History:  Procedure Laterality Date   CATARACT EXTRACTION     COLONOSCOPY WITH PROPOFOL N/A 10/19/2016   Procedure: COLONOSCOPY WITH PROPOFOL;  Surgeon: Lollie Sails, MD;  Location: [facility] ENDOSCOPY;  Service: Endoscopy;  Laterality: N/A;   EYE SURGERY     bilateral (cataracts)   FOOT SURGERY     FOOT SURGERY     KIDNEY TRANSPLANT  2007    Current Medications: Current Meds  Medication Sig   aspirin 81 MG  tablet Take 81 mg by mouth daily.   Azelastine HCl 137 MCG/SPRAY SOLN Place 1 spray into the nose 2 (two) times daily.   Biotin 1 MG CAPS Take by mouth daily.   carvedilol (COREG) 3.125 MG tablet Take 1 tablet (3.125 mg total) by mouth 2 (two) times daily.   Cholecalciferol (VITAMIN D3) 2000 UNITS CHEW Chew by mouth daily.   cyanocobalamin 1000 MCG tablet Take 1,000 mcg by mouth daily.   fexofenadine (ALLEGRA) 180 MG tablet Take 180 mg by mouth daily.   fluocinolone (SYNALAR) 0.01 % external solution Apply topically as needed.   Glucagon (BAQSIMI ONE PACK) 3 MG/DOSE POWD 1 spray in one nostril in case of severe hypoglycemia   insulin aspart (NOVOLOG) 100 UNIT/ML injection USE UP TO 65 UNITS DAILY IN PUMP.   lisinopril (ZESTRIL) 20 MG tablet Take 20 mg by mouth daily.   montelukast (SINGULAIR) 10 MG tablet Take 10 mg by mouth at bedtime.   mycophenolate (CELLCEPT) 250 MG capsule Take by mouth 2 (two) times daily.   rosuvastatin (CRESTOR) 5 MG tablet Take 5 mg by mouth daily.   tacrolimus (PROGRAF) 1 MG capsule Take 6 mg by mouth daily.    Allergies:   Erythromycin, Floxin [ofloxacin], Latex, Levaquin [levofloxacin in d5w], Moxifloxacin, Sulfa antibiotics, and Tape   Social History   Socioeconomic History   Marital status: Married    Spouse name: Not on file   Number of children: Not on file   Years of education: Not on file   Highest education level: Not on file  Occupational History   Not on file  Tobacco Use   Smoking status: Never   Smokeless tobacco: Never  Vaping Use   Vaping Use: Never used  Substance and Sexual Activity   Alcohol use: No   Drug use: No   Sexual activity: Not on file  Other Topics Concern   Not on file  Social History Narrative   Not on file   Social Determinants of Health   Financial Resource Strain: Not on file  Food Insecurity: Not on file  Transportation Needs: Not on file  Physical Activity: Not on file  Stress: Not on file  Social  Connections: Not on file     Family History:  The patient's family history includes Breast cancer in her maternal aunt; Parkinson's disease in her mother.  ROS:   12-point review of systems is negative unless otherwise noted in the HPI.   EKGs/Labs/Other Studies Reviewed:    Studies reviewed were summarized above. The additional studies were reviewed today:  2D echo 04/14/2022: 1. Left ventricular ejection fraction, by estimation, is 60 to 65%. The  left ventricle has normal function. The left ventricle has no regional  wall motion abnormalities. Left ventricular diastolic parameters are  consistent with Grade I diastolic  dysfunction (impaired relaxation). The average left ventricular  global  longitudinal strain is -16.5 %.   2. Right ventricular systolic function is normal. The right ventricular  size is normal. There is mildly elevated pulmonary artery systolic  pressure. The estimated right ventricular systolic pressure is 04.5 mmHg.   3. The mitral valve is normal in structure. Mild mitral valve  regurgitation. No evidence of mitral stenosis.   4. Tricuspid valve regurgitation is mild to moderate.   5. The aortic valve is tricuspid. Aortic valve regurgitation is not  visualized. No aortic stenosis is present.   6. The inferior vena cava is normal in size with <50% respiratory  variability, suggesting right atrial pressure of 8 mmHg.    EKG:  EKG is ordered today.  The EKG ordered today demonstrates NSR, 77 bpm, no acute abnormalities, no change compared to prior tracing  Recent Labs: 04/07/2022: BUN 44; Creatinine, Ser 2.14; Hemoglobin 9.9; Magnesium 1.8; Platelets 184; Potassium 4.4; Sodium 138  Recent Lipid Panel    Component Value Date/Time   CHOL 160 10/19/2018 0831   CHOL 145 01/30/2015 0754   TRIG 43 10/19/2018 0831   TRIG 65 01/30/2015 0754   HDL 74 10/19/2018 0831   HDL 57 01/30/2015 0754   CHOLHDL 2.2 10/19/2018 0831   VLDL 9 10/19/2018 0831   VLDL 13  01/30/2015 0754   LDLCALC 77 10/19/2018 0831   LDLCALC 75 01/30/2015 0754   LDLDIRECT 74 07/13/2018 0828    PHYSICAL EXAM:    VS:  BP (!) 152/80 (BP Location: Left Arm, Patient Position: Sitting, Cuff Size: Normal)   Pulse 77   Ht '5\' 3"'$  (1.6 m)   Wt 162 lb 3.2 oz (73.6 kg)   SpO2 98%   BMI 28.73 kg/m   BMI: Body mass index is 28.73 kg/m.  Physical Exam Constitutional:      Appearance: She is well-developed.  HENT:     Head: Normocephalic and atraumatic.  Eyes:     General:        Right eye: No discharge.        Left eye: No discharge.  Neck:     Vascular: No JVD.  Cardiovascular:     Rate and Rhythm: Normal rate and regular rhythm.     Pulses:          Posterior tibial pulses are 2+ on the right side and 2+ on the left side.     Heart sounds: S1 normal and S2 normal. Heart sounds not distant. No midsystolic click and no opening snap. Murmur heard.     High-pitched blowing holosystolic murmur is present with a grade of 1/6 at the apex.     No friction rub.  Pulmonary:     Effort: Pulmonary effort is normal. No respiratory distress.     Breath sounds: Normal breath sounds. No decreased breath sounds, wheezing or rales.  Chest:     Chest wall: No tenderness.  Abdominal:     General: There is no distension.     Palpations: Abdomen is soft.  Musculoskeletal:     Cervical back: Normal range of motion.     Right lower leg: No edema.     Left lower leg: No edema.  Skin:    General: Skin is warm and dry.     Nails: There is no clubbing.  Neurological:     Mental Status: She is alert and oriented to person, place, and time.  Psychiatric:        Speech: Speech normal.  Behavior: Behavior normal.        Thought Content: Thought content normal.        Judgment: Judgment normal.     Wt Readings from Last 3 Encounters:  04/18/22 162 lb 3.2 oz (73.6 kg)  08/11/21 162 lb (73.5 kg)  06/22/21 156 lb 8.4 oz (71 kg)     ASSESSMENT & PLAN:   HTN: Blood pressure has  been on the high side lately and continues to run high in the office today.  Elevated BP readings have persisted despite self titration of lisinopril to 20 mg daily, which was formally increased to 20 mg daily by her PCP earlier today.  We will start low-dose carvedilol 3.125 mg twice daily.  She will take carvedilol at 6 AM and 6 PM.  I have asked that she check her blood pressure around 8 AM, prior to taking lisinopril to ensure she does not have episodes of hypotension.  If we see that her blood pressure is consistently less than 295 systolic on carvedilol, prior to taking lisinopril, we may need to de-escalate lisinopril dose.  We will avoid thiazide or loop diuretics at this time in an effort to minimize renal dysfunction.  Some of her elevated BP readings may be in the context of frequent awakening at night to check her blood sugar, becoming acclimated to her CPAP, and with increased stress.  Chest pain/exertional dyspnea: No recent episodes.  We will plan to pursue Lexiscan MPI at her next visit once her blood pressure is under better control as she has at risk for silent ischemia given her long history of type 1 diabetes.  Mitral regurgitation/tricuspid regurgitation/pulmonary hypertension: Monitor with periodic echo.  Hopefully, as she continues to use her CPAP, her PA pressures will improve.  Would defer addition of loop diuretic given that she is asymptomatic and in the context of renal transplant status.  ESRD status post renal transplant: Followed by [facility] transplant nephrology.  Avoid nephrotoxic agents, including IV contrast when possible.    Disposition: F/u with Dr. Saunders Revel or an APP in 1 month.   Medication Adjustments/Labs and Tests Ordered: Current medicines are reviewed at length with the patient today.  Concerns regarding medicines are outlined above. Medication changes, Labs and Tests ordered today are summarized above and listed in the Patient Instructions accessible in Encounters.    Signed, Christell Faith, PA-C 04/18/2022 1:11 PM     [address] [place], [place] [address] [phone]

## 2022-04-18 ENCOUNTER — Ambulatory Visit: Payer: BC Managed Care – PPO | Admitting: Physician Assistant

## 2022-04-18 ENCOUNTER — Encounter: Payer: Self-pay | Admitting: Physician Assistant

## 2022-04-18 VITALS — BP 152/80 | HR 77 | Ht 63.0 in | Wt 162.2 lb

## 2022-04-18 DIAGNOSIS — N186 End stage renal disease: Secondary | ICD-10-CM

## 2022-04-18 DIAGNOSIS — I340 Nonrheumatic mitral (valve) insufficiency: Secondary | ICD-10-CM

## 2022-04-18 DIAGNOSIS — R079 Chest pain, unspecified: Secondary | ICD-10-CM

## 2022-04-18 DIAGNOSIS — I10 Essential (primary) hypertension: Secondary | ICD-10-CM

## 2022-04-18 DIAGNOSIS — Z940 Kidney transplant status: Secondary | ICD-10-CM | POA: Diagnosis not present

## 2022-04-18 DIAGNOSIS — R0609 Other forms of dyspnea: Secondary | ICD-10-CM

## 2022-04-18 MED ORDER — CARVEDILOL 3.125 MG PO TABS: 3.1250 mg | ORAL_TABLET | Freq: Two times a day (BID) | ORAL | 0 refills | Status: DC

## 2022-04-18 NOTE — Patient Instructions (Signed)
Medication Instructions:   Your physician has recommended you make the following change in your medication:   START Coreg (carvedilol) 3.125 mg TWICE daily   *If you need a refill on your cardiac medications before your next appointment, please call your pharmacy*   Lab Work:  None ordered  Testing/Procedures:  None ordered   Follow-Up: At [facility], you and your health needs are our priority.  As part of our continuing mission to provide you with exceptional heart care, we have created designated Provider Care Teams.  These Care Teams include your primary Cardiologist (physician) and Advanced Practice Providers (APPs -  Physician Assistants and Nurse Practitioners) who all work together to provide you with the care you need, when you need it.  We recommend signing up for the patient portal called "MyChart".  Sign up information is provided on this After Visit Summary.  MyChart is used to connect with patients for Virtual Visits (Telemedicine).  Patients are able to view lab/test results, encounter notes, upcoming appointments, etc.  Non-urgent messages can be sent to your provider as well.   To learn more about what you can do with MyChart, go to [email or web address].    Your next appointment:   1 month(s)  The format for your next appointment:   In Person  Provider:   Christell Faith, PA-C{   Important Information About Sugar

## 2022-04-21 ENCOUNTER — Other Ambulatory Visit: Payer: Self-pay | Admitting: *Deleted

## 2022-04-21 MED ORDER — LISINOPRIL 40 MG PO TABS: 40.0000 mg | ORAL_TABLET | Freq: Every day | ORAL | 3 refills | Status: DC

## 2022-05-11 ENCOUNTER — Other Ambulatory Visit: Payer: Self-pay | Admitting: Physician Assistant

## 2022-05-25 NOTE — Progress Notes (Signed)
Cardiology Office Note    Date:  05/26/2022   ID:  Brandi Carroll, Circle 02-08-1966, MRN [id number]  PCP:  Derinda Late, MD  Cardiologist:  Nelva Bush, MD  Electrophysiologist:  None   Chief Complaint: Follow-up  History of Present Illness:   Brandi Carroll is a 56 y.o. female with history of HTN, HLD, stroke with imbalance, brittle type 1 diabetes mellitus, CKD status post renal transplant, Bell's palsy, OSA, memory loss, tremor, and GERD who presents for follow-up of hypertension.   She established care in our office in 06/2021 for evaluation of chest and left arm pain.  However, that visit was unable to be completed because she became unresponsive in the office and was found to be severely hypoglycemic in the setting of having not eaten much and continued her basal insulin pump infusion.  She was given oral and IV glucose with prompt improvement.  She was taken to the ED for further evaluation, though left without being seen by a provider.  She was last seen in our office in 08/2021 and was without further chest or left arm pain.  She did note occasional discomfort in the right arm.  Her chronic exertional dyspnea had been stable for greater than 1 year.  She continued to report labile blood sugar and blood pressure.  Echo was recommended with plans to pursue ischemic testing thereafter.   She contacted our office on 04/11/2022 noting BP readings in the 140s to 150s over 70s to 80s with recommendation to follow-up today.   Previously scheduled echo was performed on 04/14/2022, and demonstrated an EF of 60 to 65%, no regional wall motion abnormalities, grade 1 diastolic dysfunction, normal RV systolic function and ventricular cavity size, mildly elevated PASP estimated at 42.7 mmHg, mild mitral regurgitation, mild to moderate tricuspid regurgitation, and an estimated right atrial pressure of 8 mmHg.  She was last seen in the office on 04/18/2022 and was without symptoms of angina  or decompensation.  She noted home BP readings ranging from 117-193/62-88 with most readings in the 140s to 160s millimeters mercury systolic.  It was noted she typically had low BP readings at home with possible history of whitecoat hypertension.  In the setting of elevated BP, she had self titrated lisinopril to 20 mg daily, though elevated BP readings persisted.  She was also undergoing fitting for a CPAP mask.  She was waking up approximately every 2 hours to check her blood sugar.  She was under increased stress.  We recommended she start carvedilol 3.125 mg twice daily and was advised to take lisinopril 20 mg 2 hours after her morning dose of carvedilol, with hold parameters.  It was also felt some of her elevated BP readings were in the context of frequent waking to check her blood sugar and increased stress.  We subsequently received a message on 6/15 with adverse effects to carvedilol with nausea and dizziness.  She also reported she overslept and ignored her alerts to check her blood sugar and eat leading to hypoglycemia.  In this setting, carvedilol was discontinued, and lisinopril was increased to 40 mg daily.  Despite this, home BP readings have continued to run on the high side with most readings in the 209O to 709G systolic with a range of 283 to 182/57 to 90.  She comes in today and is without symptoms of angina or decompensation.  Since we last saw her she has undergone a continuous glucose monitoring implant, which will go active later  on this afternoon.  She is uncertain if her above lethargy and nausea were related to hypoglycemia occurring while asleep or if it was related to carvedilol.  Now that she has a CGM monitor in place she is open to undergoing a trial of carvedilol.  No falls since she was last seen.  No presyncope or syncope.  No significant lower extremity swelling.     Labs independently reviewed: 04/2022 - Hgb 9.9, PLT 184, magnesium 1.8, potassium 4.4, BUN 44, serum  creatinine 2.14 11/2021 - A1c 8.5, albumin 4.0, AST/ALT not elevated, TC 166, TG 84, HDL 71, LDL 78 07/2019 - TSH normal    Past Medical History:  Diagnosis Date   Chronic kidney disease    Chronic sinusitis    Diabetes mellitus without complication (HCC)    G8Q, 7.0 last check; type 1, has insulin pump; subsequently has neuropathy;    Diabetic neuropathy (HCC)    GERD (gastroesophageal reflux disease)    Hyperlipemia    Hypertension    reports it goes either too high or too low; taking lisinopril but hasn't been regulated.   Hyperthyroidism    Insulin pump in place    Nausea and vomiting    Obesity    Osteoporosis    Stroke [facility])    Tremor     Past Surgical History:  Procedure Laterality Date   CATARACT EXTRACTION     COLONOSCOPY WITH PROPOFOL N/A 10/19/2016   Procedure: COLONOSCOPY WITH PROPOFOL;  Surgeon: Lollie Sails, MD;  Location: [facility] ENDOSCOPY;  Service: Endoscopy;  Laterality: N/A;   EYE SURGERY     bilateral (cataracts)   FOOT SURGERY     FOOT SURGERY     KIDNEY TRANSPLANT  2007    Current Medications: Current Meds  Medication Sig   aspirin 81 MG tablet Take 81 mg by mouth daily.   Biotin 1 MG CAPS Take by mouth daily.   carvedilol (COREG) 3.125 MG tablet Take 1 tablet (3.125 mg total) by mouth 2 (two) times daily.   Cholecalciferol (VITAMIN D3) 2000 UNITS CHEW Chew by mouth daily.   cyanocobalamin 1000 MCG tablet Take 1,000 mcg by mouth daily.   fexofenadine (ALLEGRA) 180 MG tablet Take 180 mg by mouth daily.   fluocinolone (SYNALAR) 0.01 % external solution Apply topically as needed.   Glucagon (BAQSIMI ONE PACK) 3 MG/DOSE POWD 1 spray in one nostril in case of severe hypoglycemia   glucagon 1 MG injection Inject 1 mg into the vein once as needed.   insulin aspart (NOVOLOG) 100 UNIT/ML injection USE UP TO 65 UNITS DAILY IN PUMP.   lisinopril (ZESTRIL) 40 MG tablet Take 1 tablet (40 mg total) by mouth daily.   montelukast (SINGULAIR) 10 MG tablet Take  10 mg by mouth at bedtime.   mycophenolate (CELLCEPT) 250 MG capsule Take by mouth 2 (two) times daily.   Olopatadine-Mometasone (RYALTRIS) [id number] MCG/ACT SUSP Place 2 sprays into the nose daily.   rosuvastatin (CRESTOR) 5 MG tablet Take 5 mg by mouth daily.   tacrolimus (PROGRAF) 1 MG capsule Take 6 mg by mouth daily.    Allergies:   Erythromycin, Floxin [ofloxacin], Latex, Levaquin [levofloxacin in d5w], Moxifloxacin, Sulfa antibiotics, and Tape   Social History   Socioeconomic History   Marital status: Married    Spouse name: Not on file   Number of children: Not on file   Years of education: Not on file   Highest education level: Not on file  Occupational History  Not on file  Tobacco Use   Smoking status: Never   Smokeless tobacco: Never  Vaping Use   Vaping Use: Never used  Substance and Sexual Activity   Alcohol use: No   Drug use: No   Sexual activity: Not on file  Other Topics Concern   Not on file  Social History Narrative   Not on file   Social Determinants of Health   Financial Resource Strain: Not on file  Food Insecurity: Not on file  Transportation Needs: Not on file  Physical Activity: Not on file  Stress: Not on file  Social Connections: Not on file     Family History:  The patient's family history includes Breast cancer in her maternal aunt; Parkinson's disease in her mother.  ROS:   12-point review of systems is negative unless otherwise noted in HPI.   EKGs/Labs/Other Studies Reviewed:    Studies reviewed were summarized above. The additional studies were reviewed today:  2D echo 04/14/2022: 1. Left ventricular ejection fraction, by estimation, is 60 to 65%. The  left ventricle has normal function. The left ventricle has no regional  wall motion abnormalities. Left ventricular diastolic parameters are  consistent with Grade I diastolic  dysfunction (impaired relaxation). The average left ventricular global  longitudinal strain is -16.5 %.    2. Right ventricular systolic function is normal. The right ventricular  size is normal. There is mildly elevated pulmonary artery systolic  pressure. The estimated right ventricular systolic pressure is 41.3 mmHg.   3. The mitral valve is normal in structure. Mild mitral valve  regurgitation. No evidence of mitral stenosis.   4. Tricuspid valve regurgitation is mild to moderate.   5. The aortic valve is tricuspid. Aortic valve regurgitation is not  visualized. No aortic stenosis is present.   6. The inferior vena cava is normal in size with <50% respiratory  variability, suggesting right atrial pressure of 8 mmHg.   EKG:  EKG is not ordered today.    Recent Labs: 04/07/2022: BUN 44; Creatinine, Ser 2.14; Hemoglobin 9.9; Magnesium 1.8; Platelets 184; Potassium 4.4; Sodium 138  Recent Lipid Panel    Component Value Date/Time   CHOL 160 10/19/2018 0831   CHOL 145 01/30/2015 0754   TRIG 43 10/19/2018 0831   TRIG 65 01/30/2015 0754   HDL 74 10/19/2018 0831   HDL 57 01/30/2015 0754   CHOLHDL 2.2 10/19/2018 0831   VLDL 9 10/19/2018 0831   VLDL 13 01/30/2015 0754   LDLCALC 77 10/19/2018 0831   LDLCALC 75 01/30/2015 0754   LDLDIRECT 74 07/13/2018 0828    PHYSICAL EXAM:    VS:  BP (!) 174/78 (BP Location: Right Arm, Patient Position: Sitting, Cuff Size: Normal)   Pulse 72   Ht '5\' 3"'$  (1.6 m)   Wt 160 lb 9.6 oz (72.8 kg)   SpO2 99%   BMI 28.45 kg/m   BMI: Body mass index is 28.45 kg/m.  Physical Exam Vitals reviewed.  Constitutional:      Appearance: She is well-developed.  HENT:     Head: Normocephalic and atraumatic.  Eyes:     General:        Right eye: No discharge.        Left eye: No discharge.  Neck:     Vascular: No JVD.  Cardiovascular:     Rate and Rhythm: Normal rate and regular rhythm.     Pulses:          Posterior tibial pulses are  2+ on the right side and 2+ on the left side.     Heart sounds: S1 normal and S2 normal. Heart sounds not distant. No  midsystolic click and no opening snap. Murmur heard.     High-pitched blowing holosystolic murmur is present with a grade of 1/6 at the apex.     No friction rub.  Pulmonary:     Effort: Pulmonary effort is normal. No respiratory distress.     Breath sounds: Normal breath sounds. No decreased breath sounds, wheezing or rales.  Chest:     Chest wall: No tenderness.  Abdominal:     General: There is no distension.  Musculoskeletal:     Cervical back: Normal range of motion.     Right lower leg: No edema.     Left lower leg: No edema.  Skin:    General: Skin is warm and dry.     Nails: There is no clubbing.  Neurological:     Mental Status: She is alert and oriented to person, place, and time.  Psychiatric:        Speech: Speech normal.        Behavior: Behavior normal.        Thought Content: Thought content normal.        Judgment: Judgment normal.     Wt Readings from Last 3 Encounters:  05/26/22 160 lb 9.6 oz (72.8 kg)  04/18/22 162 lb 3.2 oz (73.6 kg)  08/11/21 162 lb (73.5 kg)     ASSESSMENT & PLAN:   HTN: Blood pressure remains elevated in the office today.  We discussed adding amlodipine versus undergoing a retrial of carvedilol.  We ultimately elected to proceed with retrial of carvedilol 3.125 mg twice daily.  She remains on lisinopril 40 mg daily.  She will give [place] a call early next week with an update on how she felt on carvedilol.  If she notes significant fatigue or lethargy with carvedilol, we could try a low-dose of amlodipine.  Avoid thiazide and loop diuretics in an effort to minimize renal dysfunction.  Continue CPAP for underlying sleep apnea.  Chest pain/exertional dyspnea: No further issues.  For now, given improvement in symptoms, we will defer Lexiscan MPI.  This can always be revisited in the future as indicated.  Mitral regurgitation/tricuspid vegetation/pulmonary hypertension: Monitor with periodic echo.  Hopefully, as she continues to use her CPAP, her  PA pressures will improve.  Would defer addition of loop diuretic given she is asymptomatic, and in the context of renal transplant status.  ESRD status post renal transplant: Followed by [facility] transplant nephrology.  Avoid nephrotoxic agents, including IV contrast when possible.  Type 1 diabetes: Follow-up with endocrinology as directed.  OSA: CPAP.  Followed by neurology.   Disposition: F/u with Dr. Saunders Revel or an APP in 2 months.   Medication Adjustments/Labs and Tests Ordered: Current medicines are reviewed at length with the patient today.  Concerns regarding medicines are outlined above. Medication changes, Labs and Tests ordered today are summarized above and listed in the Patient Instructions accessible in Encounters.   Signed, Christell Faith, PA-C 05/26/2022 10:37 AM     [facility] [address] [place] [address] [place], [place] [address] [phone]

## 2022-05-26 ENCOUNTER — Ambulatory Visit: Payer: BC Managed Care – PPO | Admitting: Physician Assistant

## 2022-05-26 ENCOUNTER — Encounter: Payer: Self-pay | Admitting: Physician Assistant

## 2022-05-26 VITALS — BP 174/78 | HR 72 | Ht 63.0 in | Wt 160.6 lb

## 2022-05-26 DIAGNOSIS — G4733 Obstructive sleep apnea (adult) (pediatric): Secondary | ICD-10-CM

## 2022-05-26 DIAGNOSIS — N186 End stage renal disease: Secondary | ICD-10-CM

## 2022-05-26 DIAGNOSIS — R079 Chest pain, unspecified: Secondary | ICD-10-CM | POA: Diagnosis not present

## 2022-05-26 DIAGNOSIS — R0609 Other forms of dyspnea: Secondary | ICD-10-CM

## 2022-05-26 DIAGNOSIS — I340 Nonrheumatic mitral (valve) insufficiency: Secondary | ICD-10-CM

## 2022-05-26 DIAGNOSIS — Z940 Kidney transplant status: Secondary | ICD-10-CM

## 2022-05-26 DIAGNOSIS — I10 Essential (primary) hypertension: Secondary | ICD-10-CM

## 2022-05-26 DIAGNOSIS — E108 Type 1 diabetes mellitus with unspecified complications: Secondary | ICD-10-CM

## 2022-05-26 MED ORDER — CARVEDILOL 3.125 MG PO TABS: 3.1250 mg | ORAL_TABLET | Freq: Two times a day (BID) | ORAL | 3 refills | Status: DC

## 2022-05-26 NOTE — Patient Instructions (Signed)
Medication Instructions:  No changes at this time.   *If you need a refill on your cardiac medications before your next appointment, please call your pharmacy*   Lab Work: None  If you have labs (blood work) drawn today and your tests are completely normal, you will receive your results only by: [facility] (if you have MyChart) OR A paper copy in the mail If you have any lab test that is abnormal or we need to change your treatment, we will call you to review the results.   Testing/Procedures: None   Follow-Up: At [facility], you and your health needs are our priority.  As part of our continuing mission to provide you with exceptional heart care, we have created designated Provider Care Teams.  These Care Teams include your primary Cardiologist (physician) and Advanced Practice Providers (APPs -  Physician Assistants and Nurse Practitioners) who all work together to provide you with the care you need, when you need it.   Your next appointment:   2 month(s)  The format for your next appointment:   In Person  Provider:   Nelva Bush, MD or Christell Faith, PA-C      Important Information About Sugar

## 2022-05-30 ENCOUNTER — Other Ambulatory Visit: Payer: Self-pay | Admitting: *Deleted

## 2022-05-30 MED ORDER — CARVEDILOL 6.25 MG PO TABS: 6.2500 mg | ORAL_TABLET | Freq: Two times a day (BID) | ORAL | 3 refills | Status: DC

## 2022-06-06 ENCOUNTER — Other Ambulatory Visit
Admission: RE | Admit: 2022-06-06 | Discharge: 2022-06-06 | Disposition: A | Discharge status: Home / Self Care | Payer: BC Managed Care – PPO | Attending: Nephrology | Admitting: Nephrology

## 2022-06-06 DIAGNOSIS — D899 Disorder involving the immune mechanism, unspecified: Secondary | ICD-10-CM | POA: Insufficient documentation

## 2022-06-06 DIAGNOSIS — Z9483 Pancreas transplant status: Secondary | ICD-10-CM | POA: Diagnosis not present

## 2022-06-06 DIAGNOSIS — N189 Chronic kidney disease, unspecified: Secondary | ICD-10-CM | POA: Diagnosis not present

## 2022-06-06 DIAGNOSIS — Z940 Kidney transplant status: Secondary | ICD-10-CM | POA: Diagnosis present

## 2022-06-06 DIAGNOSIS — Z79899 Other long term (current) drug therapy: Secondary | ICD-10-CM | POA: Diagnosis not present

## 2022-06-06 DIAGNOSIS — E559 Vitamin D deficiency, unspecified: Secondary | ICD-10-CM | POA: Insufficient documentation

## 2022-06-06 DIAGNOSIS — Z789 Other specified health status: Secondary | ICD-10-CM | POA: Diagnosis not present

## 2022-06-06 DIAGNOSIS — D631 Anemia in chronic kidney disease: Secondary | ICD-10-CM | POA: Diagnosis not present

## 2022-06-06 DIAGNOSIS — B259 Cytomegaloviral disease, unspecified: Secondary | ICD-10-CM | POA: Diagnosis not present

## 2022-06-06 DIAGNOSIS — Z114 Encounter for screening for human immunodeficiency virus [HIV]: Secondary | ICD-10-CM | POA: Insufficient documentation

## 2022-06-06 DIAGNOSIS — Z09 Encounter for follow-up examination after completed treatment for conditions other than malignant neoplasm: Secondary | ICD-10-CM | POA: Insufficient documentation

## 2022-06-06 DIAGNOSIS — N390 Urinary tract infection, site not specified: Secondary | ICD-10-CM | POA: Diagnosis not present

## 2022-06-06 DIAGNOSIS — E1129 Type 2 diabetes mellitus with other diabetic kidney complication: Secondary | ICD-10-CM | POA: Diagnosis not present

## 2022-06-06 LAB — PHOSPHORUS: Phosphorus: 5.2 mg/dL — ABNORMAL HIGH (ref 2.5–4.6)

## 2022-06-06 LAB — CBC WITH DIFFERENTIAL/PLATELET
Abs Immature Granulocytes: 0.01 10*3/uL (ref 0.00–0.07)
Basophils Absolute: 0 10*3/uL (ref 0.0–0.1)
Basophils Relative: 0 %
Eosinophils Absolute: 0.1 10*3/uL (ref 0.0–0.5)
Eosinophils Relative: 3 %
HCT: 30.6 % — ABNORMAL LOW (ref 36.0–46.0)
Hemoglobin: 9.4 g/dL — ABNORMAL LOW (ref 12.0–15.0)
Immature Granulocytes: 0 %
Lymphocytes Relative: 23 %
Lymphs Abs: 1.1 10*3/uL (ref 0.7–4.0)
MCH: 26 pg (ref 26.0–34.0)
MCHC: 30.7 g/dL (ref 30.0–36.0)
MCV: 84.8 fL (ref 80.0–100.0)
Monocytes Absolute: 0.4 10*3/uL (ref 0.1–1.0)
Monocytes Relative: 8 %
Neutro Abs: 3.2 10*3/uL (ref 1.7–7.7)
Neutrophils Relative %: 66 %
Platelets: 157 10*3/uL (ref 150–400)
RBC: 3.61 MIL/uL — ABNORMAL LOW (ref 3.87–5.11)
RDW: 13.7 % (ref 11.5–15.5)
WBC: 4.8 10*3/uL (ref 4.0–10.5)
nRBC: 0 % (ref 0.0–0.2)

## 2022-06-06 LAB — BASIC METABOLIC PANEL
Anion gap: 8 (ref 5–15)
BUN: 40 mg/dL — ABNORMAL HIGH (ref 6–20)
CO2: 22 mmol/L (ref 22–32)
Calcium: 9 mg/dL (ref 8.9–10.3)
Chloride: 105 mmol/L (ref 98–111)
Creatinine, Ser: 2.32 mg/dL — ABNORMAL HIGH (ref 0.44–1.00)
GFR, Estimated: 24 mL/min — ABNORMAL LOW (ref >60)
Glucose, Bld: 126 mg/dL — ABNORMAL HIGH (ref 70–99)
Potassium: 4.6 mmol/L (ref 3.5–5.1)
Sodium: 135 mmol/L (ref 135–145)

## 2022-06-06 LAB — MAGNESIUM: Magnesium: 1.8 mg/dL (ref 1.7–2.4)

## 2022-06-08 LAB — TACROLIMUS LEVEL: Tacrolimus (FK506) - LabCorp: 5.4 ng/mL (ref 2.0–20.0)

## 2022-06-14 ENCOUNTER — Other Ambulatory Visit: Payer: Self-pay | Admitting: *Deleted

## 2022-06-14 ENCOUNTER — Telehealth: Payer: Self-pay | Admitting: *Deleted

## 2022-06-14 MED ORDER — CARVEDILOL 12.5 MG PO TABS: 12.5000 mg | ORAL_TABLET | Freq: Two times a day (BID) | ORAL | 3 refills | Status: DC

## 2022-06-14 NOTE — Telephone Encounter (Signed)
Left voicemail message to call back for review of provider recommendations.      Rise Mu, PA-C  21 hours ago (1:13 PM)    Please inform Ms. Jessee her blood pressure readings have been reviewed and are overall gradually improving, though do remain above goal.  Please have her increase carvedilol to 12.5 mg twice daily.  We will follow-up on her blood pressure at her next visit.  Please provide hypotension precautions.

## 2022-06-15 NOTE — Telephone Encounter (Signed)
See telephone encounter. Reviewed recommendations with patient and scheduled her for appointment per recall. No further needs.

## 2022-06-15 NOTE — Telephone Encounter (Signed)
Pt returning a call.

## 2022-06-15 NOTE — Telephone Encounter (Signed)
Reviewed provider recommendations with patient and she verbalized understanding with no further questions at this time. We also scheduled her follow up appointment per recall.

## 2022-07-05 ENCOUNTER — Other Ambulatory Visit
Admission: RE | Admit: 2022-07-05 | Discharge: 2022-07-05 | Disposition: A | Discharge status: Home / Self Care | Payer: BC Managed Care – PPO | Attending: Nephrology | Admitting: Nephrology

## 2022-07-05 DIAGNOSIS — Z789 Other specified health status: Secondary | ICD-10-CM | POA: Diagnosis not present

## 2022-07-05 DIAGNOSIS — D899 Disorder involving the immune mechanism, unspecified: Secondary | ICD-10-CM | POA: Insufficient documentation

## 2022-07-05 DIAGNOSIS — Z9483 Pancreas transplant status: Secondary | ICD-10-CM | POA: Insufficient documentation

## 2022-07-05 DIAGNOSIS — N390 Urinary tract infection, site not specified: Secondary | ICD-10-CM | POA: Insufficient documentation

## 2022-07-05 DIAGNOSIS — Z09 Encounter for follow-up examination after completed treatment for conditions other than malignant neoplasm: Secondary | ICD-10-CM | POA: Insufficient documentation

## 2022-07-05 DIAGNOSIS — Z114 Encounter for screening for human immunodeficiency virus [HIV]: Secondary | ICD-10-CM | POA: Insufficient documentation

## 2022-07-05 DIAGNOSIS — D631 Anemia in chronic kidney disease: Secondary | ICD-10-CM | POA: Insufficient documentation

## 2022-07-05 DIAGNOSIS — Z79899 Other long term (current) drug therapy: Secondary | ICD-10-CM | POA: Diagnosis not present

## 2022-07-05 DIAGNOSIS — Z940 Kidney transplant status: Secondary | ICD-10-CM | POA: Insufficient documentation

## 2022-07-05 DIAGNOSIS — E1129 Type 2 diabetes mellitus with other diabetic kidney complication: Secondary | ICD-10-CM | POA: Insufficient documentation

## 2022-07-05 DIAGNOSIS — B259 Cytomegaloviral disease, unspecified: Secondary | ICD-10-CM | POA: Insufficient documentation

## 2022-07-05 DIAGNOSIS — T8610 Unspecified complication of kidney transplant: Secondary | ICD-10-CM | POA: Insufficient documentation

## 2022-07-05 LAB — CBC WITH DIFFERENTIAL/PLATELET
Abs Immature Granulocytes: 0.02 10*3/uL (ref 0.00–0.07)
Basophils Absolute: 0 10*3/uL (ref 0.0–0.1)
Basophils Relative: 0 %
Eosinophils Absolute: 0.2 10*3/uL (ref 0.0–0.5)
Eosinophils Relative: 4 %
HCT: 26.6 % — ABNORMAL LOW (ref 36.0–46.0)
Hemoglobin: 8.5 g/dL — ABNORMAL LOW (ref 12.0–15.0)
Immature Granulocytes: 0 %
Lymphocytes Relative: 27 %
Lymphs Abs: 1.4 10*3/uL (ref 0.7–4.0)
MCH: 26.2 pg (ref 26.0–34.0)
MCHC: 32 g/dL (ref 30.0–36.0)
MCV: 81.8 fL (ref 80.0–100.0)
Monocytes Absolute: 0.7 10*3/uL (ref 0.1–1.0)
Monocytes Relative: 13 %
Neutro Abs: 2.7 10*3/uL (ref 1.7–7.7)
Neutrophils Relative %: 56 %
Platelets: 159 10*3/uL (ref 150–400)
RBC: 3.25 MIL/uL — ABNORMAL LOW (ref 3.87–5.11)
RDW: 14.8 % (ref 11.5–15.5)
WBC: 4.9 10*3/uL (ref 4.0–10.5)
nRBC: 0 % (ref 0.0–0.2)

## 2022-07-05 LAB — BASIC METABOLIC PANEL
Anion gap: 8 (ref 5–15)
BUN: 79 mg/dL — ABNORMAL HIGH (ref 6–20)
CO2: 20 mmol/L — ABNORMAL LOW (ref 22–32)
Calcium: 8.9 mg/dL (ref 8.9–10.3)
Chloride: 102 mmol/L (ref 98–111)
Creatinine, Ser: 4.55 mg/dL — ABNORMAL HIGH (ref 0.44–1.00)
GFR, Estimated: 11 mL/min — ABNORMAL LOW (ref >60)
Glucose, Bld: 128 mg/dL — ABNORMAL HIGH (ref 70–99)
Potassium: 4.6 mmol/L (ref 3.5–5.1)
Sodium: 130 mmol/L — ABNORMAL LOW (ref 135–145)

## 2022-07-05 LAB — MAGNESIUM: Magnesium: 2.2 mg/dL (ref 1.7–2.4)

## 2022-07-05 LAB — PHOSPHORUS: Phosphorus: 5.3 mg/dL — ABNORMAL HIGH (ref 2.5–4.6)

## 2022-07-07 LAB — TACROLIMUS LEVEL: Tacrolimus (FK506) - LabCorp: 8.2 ng/mL (ref 2.0–20.0)

## 2022-07-14 ENCOUNTER — Other Ambulatory Visit
Admission: RE | Admit: 2022-07-14 | Discharge: 2022-07-14 | Disposition: A | Discharge status: Home / Self Care | Payer: BC Managed Care – PPO | Attending: Nephrology | Admitting: Nephrology

## 2022-07-14 DIAGNOSIS — Z79899 Other long term (current) drug therapy: Secondary | ICD-10-CM | POA: Diagnosis not present

## 2022-07-14 DIAGNOSIS — B259 Cytomegaloviral disease, unspecified: Secondary | ICD-10-CM | POA: Diagnosis not present

## 2022-07-14 DIAGNOSIS — Z09 Encounter for follow-up examination after completed treatment for conditions other than malignant neoplasm: Secondary | ICD-10-CM | POA: Diagnosis present

## 2022-07-14 DIAGNOSIS — E559 Vitamin D deficiency, unspecified: Secondary | ICD-10-CM | POA: Diagnosis not present

## 2022-07-14 DIAGNOSIS — Z9483 Pancreas transplant status: Secondary | ICD-10-CM | POA: Diagnosis not present

## 2022-07-14 DIAGNOSIS — Z789 Other specified health status: Secondary | ICD-10-CM | POA: Insufficient documentation

## 2022-07-14 DIAGNOSIS — D631 Anemia in chronic kidney disease: Secondary | ICD-10-CM | POA: Diagnosis not present

## 2022-07-14 DIAGNOSIS — Z114 Encounter for screening for human immunodeficiency virus [HIV]: Secondary | ICD-10-CM | POA: Insufficient documentation

## 2022-07-14 DIAGNOSIS — N390 Urinary tract infection, site not specified: Secondary | ICD-10-CM | POA: Diagnosis not present

## 2022-07-14 DIAGNOSIS — D899 Disorder involving the immune mechanism, unspecified: Secondary | ICD-10-CM | POA: Insufficient documentation

## 2022-07-14 DIAGNOSIS — E1129 Type 2 diabetes mellitus with other diabetic kidney complication: Secondary | ICD-10-CM | POA: Insufficient documentation

## 2022-07-14 DIAGNOSIS — Z940 Kidney transplant status: Secondary | ICD-10-CM | POA: Diagnosis not present

## 2022-07-14 LAB — CBC WITH DIFFERENTIAL/PLATELET
Abs Immature Granulocytes: 0.02 10*3/uL (ref 0.00–0.07)
Basophils Absolute: 0 10*3/uL (ref 0.0–0.1)
Basophils Relative: 0 %
Eosinophils Absolute: 0.2 10*3/uL (ref 0.0–0.5)
Eosinophils Relative: 4 %
HCT: 29.6 % — ABNORMAL LOW (ref 36.0–46.0)
Hemoglobin: 9.1 g/dL — ABNORMAL LOW (ref 12.0–15.0)
Immature Granulocytes: 0 %
Lymphocytes Relative: 25 %
Lymphs Abs: 1.1 10*3/uL (ref 0.7–4.0)
MCH: 26.1 pg (ref 26.0–34.0)
MCHC: 30.7 g/dL (ref 30.0–36.0)
MCV: 85.1 fL (ref 80.0–100.0)
Monocytes Absolute: 0.4 10*3/uL (ref 0.1–1.0)
Monocytes Relative: 9 %
Neutro Abs: 2.8 10*3/uL (ref 1.7–7.7)
Neutrophils Relative %: 62 %
Platelets: 214 10*3/uL (ref 150–400)
RBC: 3.48 MIL/uL — ABNORMAL LOW (ref 3.87–5.11)
RDW: 16.5 % — ABNORMAL HIGH (ref 11.5–15.5)
WBC: 4.6 10*3/uL (ref 4.0–10.5)
nRBC: 0 % (ref 0.0–0.2)

## 2022-07-14 LAB — BASIC METABOLIC PANEL
Anion gap: 9 (ref 5–15)
BUN: 62 mg/dL — ABNORMAL HIGH (ref 6–20)
CO2: 20 mmol/L — ABNORMAL LOW (ref 22–32)
Calcium: 9.2 mg/dL (ref 8.9–10.3)
Chloride: 112 mmol/L — ABNORMAL HIGH (ref 98–111)
Creatinine, Ser: 3.37 mg/dL — ABNORMAL HIGH (ref 0.44–1.00)
GFR, Estimated: 15 mL/min — ABNORMAL LOW (ref >60)
Glucose, Bld: 81 mg/dL (ref 70–99)
Potassium: 5.1 mmol/L (ref 3.5–5.1)
Sodium: 141 mmol/L (ref 135–145)

## 2022-07-14 LAB — PHOSPHORUS: Phosphorus: 5 mg/dL — ABNORMAL HIGH (ref 2.5–4.6)

## 2022-07-14 LAB — MAGNESIUM: Magnesium: 2.1 mg/dL (ref 1.7–2.4)

## 2022-07-16 LAB — TACROLIMUS LEVEL: Tacrolimus (FK506) - LabCorp: 6.2 ng/mL (ref 2.0–20.0)

## 2022-07-24 ENCOUNTER — Other Ambulatory Visit
Admission: RE | Admit: 2022-07-24 | Discharge: 2022-07-24 | Disposition: A | Discharge status: Home / Self Care | Payer: BC Managed Care – PPO | Attending: Nephrology | Admitting: Nephrology

## 2022-07-24 DIAGNOSIS — D899 Disorder involving the immune mechanism, unspecified: Secondary | ICD-10-CM | POA: Insufficient documentation

## 2022-07-24 DIAGNOSIS — B259 Cytomegaloviral disease, unspecified: Secondary | ICD-10-CM | POA: Insufficient documentation

## 2022-07-24 DIAGNOSIS — D631 Anemia in chronic kidney disease: Secondary | ICD-10-CM | POA: Diagnosis not present

## 2022-07-24 DIAGNOSIS — Z09 Encounter for follow-up examination after completed treatment for conditions other than malignant neoplasm: Secondary | ICD-10-CM | POA: Insufficient documentation

## 2022-07-24 DIAGNOSIS — Z79899 Other long term (current) drug therapy: Secondary | ICD-10-CM | POA: Insufficient documentation

## 2022-07-24 DIAGNOSIS — N390 Urinary tract infection, site not specified: Secondary | ICD-10-CM | POA: Insufficient documentation

## 2022-07-24 DIAGNOSIS — Z789 Other specified health status: Secondary | ICD-10-CM | POA: Insufficient documentation

## 2022-07-24 DIAGNOSIS — E559 Vitamin D deficiency, unspecified: Secondary | ICD-10-CM | POA: Diagnosis not present

## 2022-07-24 DIAGNOSIS — Z9483 Pancreas transplant status: Secondary | ICD-10-CM | POA: Insufficient documentation

## 2022-07-24 DIAGNOSIS — Z114 Encounter for screening for human immunodeficiency virus [HIV]: Secondary | ICD-10-CM | POA: Insufficient documentation

## 2022-07-24 DIAGNOSIS — Z940 Kidney transplant status: Secondary | ICD-10-CM | POA: Diagnosis not present

## 2022-07-24 DIAGNOSIS — E1129 Type 2 diabetes mellitus with other diabetic kidney complication: Secondary | ICD-10-CM | POA: Insufficient documentation

## 2022-07-24 LAB — CBC WITH DIFFERENTIAL/PLATELET
Abs Immature Granulocytes: 0.02 10*3/uL (ref 0.00–0.07)
Basophils Absolute: 0 10*3/uL (ref 0.0–0.1)
Basophils Relative: 0 %
Eosinophils Absolute: 0.2 10*3/uL (ref 0.0–0.5)
Eosinophils Relative: 5 %
HCT: 27.1 % — ABNORMAL LOW (ref 36.0–46.0)
Hemoglobin: 8.4 g/dL — ABNORMAL LOW (ref 12.0–15.0)
Immature Granulocytes: 1 %
Lymphocytes Relative: 24 %
Lymphs Abs: 0.9 10*3/uL (ref 0.7–4.0)
MCH: 26.2 pg (ref 26.0–34.0)
MCHC: 31 g/dL (ref 30.0–36.0)
MCV: 84.4 fL (ref 80.0–100.0)
Monocytes Absolute: 0.5 10*3/uL (ref 0.1–1.0)
Monocytes Relative: 14 %
Neutro Abs: 2 10*3/uL (ref 1.7–7.7)
Neutrophils Relative %: 56 %
Platelets: 204 10*3/uL (ref 150–400)
RBC: 3.21 MIL/uL — ABNORMAL LOW (ref 3.87–5.11)
RDW: 15.5 % (ref 11.5–15.5)
WBC: 3.6 10*3/uL — ABNORMAL LOW (ref 4.0–10.5)
nRBC: 0 % (ref 0.0–0.2)

## 2022-07-24 LAB — BASIC METABOLIC PANEL
Anion gap: 9 (ref 5–15)
BUN: 55 mg/dL — ABNORMAL HIGH (ref 6–20)
CO2: 21 mmol/L — ABNORMAL LOW (ref 22–32)
Calcium: 9.2 mg/dL (ref 8.9–10.3)
Chloride: 111 mmol/L (ref 98–111)
Creatinine, Ser: 3.09 mg/dL — ABNORMAL HIGH (ref 0.44–1.00)
GFR, Estimated: 17 mL/min — ABNORMAL LOW (ref >60)
Glucose, Bld: 79 mg/dL (ref 70–99)
Potassium: 4.7 mmol/L (ref 3.5–5.1)
Sodium: 141 mmol/L (ref 135–145)

## 2022-07-24 LAB — URINALYSIS, COMPLETE (UACMP) WITH MICROSCOPIC
Bacteria, UA: NONE SEEN
Bilirubin Urine: NEGATIVE
Glucose, UA: NEGATIVE mg/dL
Hgb urine dipstick: NEGATIVE
Ketones, ur: NEGATIVE mg/dL
Leukocytes,Ua: NEGATIVE
Nitrite: NEGATIVE
Protein, ur: 100 mg/dL — AB
Specific Gravity, Urine: 1.008 (ref 1.005–1.030)
pH: 5 (ref 5.0–8.0)

## 2022-07-24 LAB — PROTEIN / CREATININE RATIO, URINE
Creatinine, Urine: 50 mg/dL
Protein Creatinine Ratio: 3.24 mg/mg{Cre} — ABNORMAL HIGH (ref 0.00–0.15)
Total Protein, Urine: 162 mg/dL

## 2022-07-24 LAB — PHOSPHORUS: Phosphorus: 4.9 mg/dL — ABNORMAL HIGH (ref 2.5–4.6)

## 2022-07-24 LAB — MAGNESIUM: Magnesium: 1.6 mg/dL — ABNORMAL LOW (ref 1.7–2.4)

## 2022-07-24 NOTE — Progress Notes (Deleted)
Cardiology Office Note    Date:  07/24/2022   ID:  Brandi Carroll, DOB 07/15/1966, MRN [id number]  PCP:  Derinda Late, MD  Cardiologist:  Nelva Bush, MD  Electrophysiologist:  None   Chief Complaint: ***  History of Present Illness:   Brandi Carroll is a 56 y.o. female with history of HTN, HLD, stroke with imbalance, brittle type 1 diabetes mellitus, CKD status post renal transplant, Bell's palsy, OSA, memory loss, tremor, and GERD who presents for follow-up of ***.   She established care in our office in 06/2021 for evaluation of chest and left arm pain.  However, that visit was unable to be completed because she became unresponsive in the office and was found to be severely hypoglycemic in the setting of having not eaten much and continued her basal insulin pump infusion.  She was given oral and IV glucose with prompt improvement.  She was taken to the ED for further evaluation, though left without being seen by a provider.  She was last seen in our office in 08/2021 and was without further chest or left arm pain.  She did note occasional discomfort in the right arm.  Her chronic exertional dyspnea had been stable for greater than 1 year.  She continued to report labile blood sugar and blood pressure.  Echo was recommended with plans to pursue ischemic testing thereafter.   Previously scheduled echo was performed on 04/14/2022, and demonstrated an EF of 60 to 65%, no regional wall motion abnormalities, grade 1 diastolic dysfunction, normal RV systolic function and ventricular cavity size, mildly elevated PASP estimated at 42.7 mmHg, mild mitral regurgitation, mild to moderate tricuspid regurgitation, and an estimated right atrial pressure of 8 mmHg.   She was seen in the office on 04/18/2022 and was without symptoms of angina or decompensation.  She noted home BP readings ranging from 117-193/62-88 with most readings in the 140s to 160s mmHg.  It was noted she typically had low  BP readings at home with possible history of whitecoat hypertension.  In the setting of elevated BP, she had self titrated lisinopril to 20 mg daily, though elevated BP readings persisted.  She was also undergoing fitting for a CPAP mask.  She was waking up approximately every 2 hours to check her blood sugar.  She was under increased stress.  We recommended she start carvedilol 3.125 mg twice daily and was advised to take lisinopril 20 mg 2 hours after her morning dose of carvedilol, with hold parameters.  It was also felt some of her elevated BP readings were in the context of frequent waking to check her blood sugar and increased stress.   We subsequently received a message on 6/15 with adverse effects to carvedilol with nausea and dizziness.  She also reported she overslept and ignored her alerts to check her blood sugar and eat leading to hypoglycemia.  In this setting, carvedilol was discontinued, and lisinopril was increased to 40 mg daily.  Despite this, home BP readings have continued to run on the high side with most readings in the 412I to 786V systolic with a range of 672 to 182/57 to 90.  She was last seen in the office on 05/26/2022 and was without symptoms of angina or decompensation.  She had undergone continuous glucose monitoring implant.  She underwent rechallenge of carvedilol with subsequent uptitration without adverse effect.  She was admitted to [facility] in 06/2022 with abdominal pain with associated nausea, vomiting, and diarrhea.  Serum creatinine  4.11 upon admission from a baseline of 1.7-2.3, and suspected to be AKI from nausea, vomiting, and diarrhea.  Presenting potassium 7.3 with associated bradycardia and peaked T waves.  During the admission, antihypertensives were initially held due to soft blood pressure, though systolic subsequently increased to the 190s to 210s with titration of carvedilol to 12.5 mg and initiation of amlodipine 10 mg.  Lisinopril was held at discharge due to renal  dysfunction.  ***   Labs independently reviewed: 07/2022 - Hgb 9.3, PLT 183, magnesium 1.6, potassium 5.1, BUN 36, serum creatinine 2.92, albumin 3.9, TC 111, TG 89, HDL 44, LDL 49 06/2022 - AST normal, ALT 52, A1c 7.3 07/2019 - TSH normal  Past Medical History:  Diagnosis Date   Chronic kidney disease    Chronic sinusitis    Diabetes mellitus without complication (HCC)    H6W, 7.0 last check; type 1, has insulin pump; subsequently has neuropathy;    Diabetic neuropathy (HCC)    GERD (gastroesophageal reflux disease)    Hyperlipemia    Hypertension    reports it goes either too high or too low; taking lisinopril but hasn't been regulated.   Hyperthyroidism    Insulin pump in place    Nausea and vomiting    Obesity    Osteoporosis    Stroke [facility])    Tremor     Past Surgical History:  Procedure Laterality Date   CATARACT EXTRACTION     COLONOSCOPY WITH PROPOFOL N/A 10/19/2016   Procedure: COLONOSCOPY WITH PROPOFOL;  Surgeon: Lollie Sails, MD;  Location: [facility] ENDOSCOPY;  Service: Endoscopy;  Laterality: N/A;   EYE SURGERY     bilateral (cataracts)   FOOT SURGERY     FOOT SURGERY     KIDNEY TRANSPLANT  2007    Current Medications: No outpatient medications have been marked as taking for the 07/26/22 encounter (Appointment) with Rise Mu, PA-C.    Allergies:   Erythromycin, Floxin [ofloxacin], Latex, Levaquin [levofloxacin in d5w], Moxifloxacin, Sulfa antibiotics, and Tape   Social History   Socioeconomic History   Marital status: Married    Spouse name: Not on file   Number of children: Not on file   Years of education: Not on file   Highest education level: Not on file  Occupational History   Not on file  Tobacco Use   Smoking status: Never   Smokeless tobacco: Never  Vaping Use   Vaping Use: Never used  Substance and Sexual Activity   Alcohol use: No   Drug use: No   Sexual activity: Not on file  Other Topics Concern   Not on file  Social  History Narrative   Not on file   Social Determinants of Health   Financial Resource Strain: Not on file  Food Insecurity: Not on file  Transportation Needs: Not on file  Physical Activity: Not on file  Stress: Not on file  Social Connections: Not on file     Family History:  The patient's family history includes Breast cancer in her maternal aunt; Parkinson's disease in her mother.  ROS:   ROS   EKGs/Labs/Other Studies Reviewed:    Studies reviewed were summarized above. The additional studies were reviewed today:  2D echo 04/14/2022: 1. Left ventricular ejection fraction, by estimation, is 60 to 65%. The  left ventricle has normal function. The left ventricle has no regional  wall motion abnormalities. Left ventricular diastolic parameters are  consistent with Grade I diastolic  dysfunction (impaired relaxation).  The average left ventricular global  longitudinal strain is -16.5 %.   2. Right ventricular systolic function is normal. The right ventricular  size is normal. There is mildly elevated pulmonary artery systolic  pressure. The estimated right ventricular systolic pressure is 40.1 mmHg.   3. The mitral valve is normal in structure. Mild mitral valve  regurgitation. No evidence of mitral stenosis.   4. Tricuspid valve regurgitation is mild to moderate.   5. The aortic valve is tricuspid. Aortic valve regurgitation is not  visualized. No aortic stenosis is present.   6. The inferior vena cava is normal in size with <50% respiratory  variability, suggesting right atrial pressure of 8 mmHg.   EKG:  EKG is ordered today.  The EKG ordered today demonstrates ***  Recent Labs: 07/14/2022: BUN 62; Creatinine, Ser 3.37; Hemoglobin 9.1; Magnesium 2.1; Platelets 214; Potassium 5.1; Sodium 141  Recent Lipid Panel    Component Value Date/Time   CHOL 160 10/19/2018 0831   CHOL 145 01/30/2015 0754   TRIG 43 10/19/2018 0831   TRIG 65 01/30/2015 0754   HDL 74 10/19/2018 0831    HDL 57 01/30/2015 0754   CHOLHDL 2.2 10/19/2018 0831   VLDL 9 10/19/2018 0831   VLDL 13 01/30/2015 0754   LDLCALC 77 10/19/2018 0831   LDLCALC 75 01/30/2015 0754   LDLDIRECT 74 07/13/2018 0828    PHYSICAL EXAM:    VS:  There were no vitals taken for this visit.  BMI: There is no height or weight on file to calculate BMI.  Physical Exam  Wt Readings from Last 3 Encounters:  05/26/22 160 lb 9.6 oz (72.8 kg)  04/18/22 162 lb 3.2 oz (73.6 kg)  08/11/21 162 lb (73.5 kg)     ASSESSMENT & PLAN:   HTN: Blood pressure  Mitral regurgitation/tricuspid regurgitation/pulmonary hypertension:  Chest pain/exertional dyspnea:  ESRD status post renal transplant: Followed by [facility] transplant nephrology.  Type 1 diabetes:  OSA:   {Are you ordering a CV Procedure (e.g. stress test, cath, DCCV, TEE, etc)?   Press F2        :[id number]}     Disposition: F/u with Dr. Saunders Revel or an APP in ***.   Medication Adjustments/Labs and Tests Ordered: Current medicines are reviewed at length with the patient today.  Concerns regarding medicines are outlined above. Medication changes, Labs and Tests ordered today are summarized above and listed in the Patient Instructions accessible in Encounters.   Signed, Christell Faith, PA-C 07/24/2022 6:58 AM     [facility] [address] [place] [address] [place], [place] [address] [phone]

## 2022-07-25 LAB — TACROLIMUS LEVEL: Tacrolimus (FK506) - LabCorp: 4.5 ng/mL (ref 2.0–20.0)

## 2022-07-25 LAB — URINE CULTURE: Culture: <10000 — AB

## 2022-07-26 ENCOUNTER — Ambulatory Visit: Payer: BC Managed Care – PPO | Admitting: Physician Assistant

## 2022-07-26 DIAGNOSIS — N186 End stage renal disease: Secondary | ICD-10-CM

## 2022-07-26 DIAGNOSIS — R079 Chest pain, unspecified: Secondary | ICD-10-CM

## 2022-07-26 DIAGNOSIS — E108 Type 1 diabetes mellitus with unspecified complications: Secondary | ICD-10-CM

## 2022-07-26 DIAGNOSIS — I340 Nonrheumatic mitral (valve) insufficiency: Secondary | ICD-10-CM

## 2022-07-26 DIAGNOSIS — G4733 Obstructive sleep apnea (adult) (pediatric): Secondary | ICD-10-CM

## 2022-07-26 DIAGNOSIS — I10 Essential (primary) hypertension: Secondary | ICD-10-CM

## 2022-07-26 DIAGNOSIS — R0609 Other forms of dyspnea: Secondary | ICD-10-CM

## 2022-07-26 DIAGNOSIS — Z940 Kidney transplant status: Secondary | ICD-10-CM

## 2022-07-27 ENCOUNTER — Ambulatory Visit: Payer: BC Managed Care – PPO | Admitting: Nurse Practitioner

## 2022-07-31 ENCOUNTER — Telehealth: Payer: Self-pay | Admitting: Internal Medicine

## 2022-07-31 ENCOUNTER — Other Ambulatory Visit
Admission: RE | Admit: 2022-07-31 | Discharge: 2022-07-31 | Disposition: A | Discharge status: Home / Self Care | Payer: BC Managed Care – PPO | Attending: Nephrology | Admitting: Nephrology

## 2022-07-31 DIAGNOSIS — Z789 Other specified health status: Secondary | ICD-10-CM | POA: Diagnosis not present

## 2022-07-31 DIAGNOSIS — Z09 Encounter for follow-up examination after completed treatment for conditions other than malignant neoplasm: Secondary | ICD-10-CM | POA: Diagnosis not present

## 2022-07-31 DIAGNOSIS — Z114 Encounter for screening for human immunodeficiency virus [HIV]: Secondary | ICD-10-CM | POA: Diagnosis not present

## 2022-07-31 DIAGNOSIS — Z9483 Pancreas transplant status: Secondary | ICD-10-CM | POA: Diagnosis not present

## 2022-07-31 DIAGNOSIS — Z940 Kidney transplant status: Secondary | ICD-10-CM | POA: Diagnosis not present

## 2022-07-31 DIAGNOSIS — E559 Vitamin D deficiency, unspecified: Secondary | ICD-10-CM | POA: Diagnosis not present

## 2022-07-31 DIAGNOSIS — D631 Anemia in chronic kidney disease: Secondary | ICD-10-CM | POA: Insufficient documentation

## 2022-07-31 DIAGNOSIS — D899 Disorder involving the immune mechanism, unspecified: Secondary | ICD-10-CM | POA: Diagnosis not present

## 2022-07-31 DIAGNOSIS — Z79899 Other long term (current) drug therapy: Secondary | ICD-10-CM | POA: Insufficient documentation

## 2022-07-31 DIAGNOSIS — B259 Cytomegaloviral disease, unspecified: Secondary | ICD-10-CM | POA: Diagnosis not present

## 2022-07-31 DIAGNOSIS — T8610 Unspecified complication of kidney transplant: Secondary | ICD-10-CM | POA: Diagnosis not present

## 2022-07-31 DIAGNOSIS — E1129 Type 2 diabetes mellitus with other diabetic kidney complication: Secondary | ICD-10-CM | POA: Insufficient documentation

## 2022-07-31 DIAGNOSIS — N390 Urinary tract infection, site not specified: Secondary | ICD-10-CM | POA: Diagnosis not present

## 2022-07-31 DIAGNOSIS — Z0279 Encounter for issue of other medical certificate: Secondary | ICD-10-CM

## 2022-07-31 LAB — CBC WITH DIFFERENTIAL/PLATELET
Abs Immature Granulocytes: 0 10*3/uL (ref 0.00–0.07)
Basophils Absolute: 0 10*3/uL (ref 0.0–0.1)
Basophils Relative: 1 %
Eosinophils Absolute: 0.2 10*3/uL (ref 0.0–0.5)
Eosinophils Relative: 4 %
HCT: 27.8 % — ABNORMAL LOW (ref 36.0–46.0)
Hemoglobin: 8.7 g/dL — ABNORMAL LOW (ref 12.0–15.0)
Immature Granulocytes: 0 %
Lymphocytes Relative: 23 %
Lymphs Abs: 0.9 10*3/uL (ref 0.7–4.0)
MCH: 25.7 pg — ABNORMAL LOW (ref 26.0–34.0)
MCHC: 31.3 g/dL (ref 30.0–36.0)
MCV: 82.2 fL (ref 80.0–100.0)
Monocytes Absolute: 0.5 10*3/uL (ref 0.1–1.0)
Monocytes Relative: 13 %
Neutro Abs: 2.5 10*3/uL (ref 1.7–7.7)
Neutrophils Relative %: 59 %
Platelets: 218 10*3/uL (ref 150–400)
RBC: 3.38 MIL/uL — ABNORMAL LOW (ref 3.87–5.11)
RDW: 15 % (ref 11.5–15.5)
WBC: 4.1 10*3/uL (ref 4.0–10.5)
nRBC: 0 % (ref 0.0–0.2)

## 2022-07-31 LAB — URINALYSIS, COMPLETE (UACMP) WITH MICROSCOPIC
Bacteria, UA: NONE SEEN
Bilirubin Urine: NEGATIVE
Glucose, UA: NEGATIVE mg/dL
Ketones, ur: NEGATIVE mg/dL
Leukocytes,Ua: NEGATIVE
Nitrite: NEGATIVE
Protein, ur: >300 mg/dL — AB
Specific Gravity, Urine: 1.015 (ref 1.005–1.030)
pH: 6 (ref 5.0–8.0)

## 2022-07-31 LAB — BASIC METABOLIC PANEL
Anion gap: 11 (ref 5–15)
BUN: 57 mg/dL — ABNORMAL HIGH (ref 6–20)
CO2: 24 mmol/L (ref 22–32)
Calcium: 9 mg/dL (ref 8.9–10.3)
Chloride: 103 mmol/L (ref 98–111)
Creatinine, Ser: 3.06 mg/dL — ABNORMAL HIGH (ref 0.44–1.00)
GFR, Estimated: 17 mL/min — ABNORMAL LOW (ref >60)
Glucose, Bld: 156 mg/dL — ABNORMAL HIGH (ref 70–99)
Potassium: 3.9 mmol/L (ref 3.5–5.1)
Sodium: 138 mmol/L (ref 135–145)

## 2022-07-31 LAB — PROTEIN / CREATININE RATIO, URINE
Creatinine, Urine: 44 mg/dL
Protein Creatinine Ratio: 3.61 mg/mg{Cre} — ABNORMAL HIGH (ref 0.00–0.15)
Total Protein, Urine: 159 mg/dL

## 2022-07-31 NOTE — Telephone Encounter (Signed)
Received [employer] STD forms to be completed  Patient came by office and signed heartcare forms Paid [phone] fee with check Placed forms in nurse box

## 2022-08-01 LAB — URINE CULTURE: Culture: NO GROWTH

## 2022-08-01 NOTE — Telephone Encounter (Signed)
Forms placed on your desk for review and completion.

## 2022-08-02 LAB — TACROLIMUS LEVEL: Tacrolimus (FK506) - LabCorp: 4 ng/mL (ref 2.0–20.0)

## 2022-08-02 NOTE — Telephone Encounter (Signed)
Spoke with patient regarding her disability forms. Forms were reviewed by provider. Upon his review we are only treating her for blood pressures and based on that that would not be considered a disability or restrictions from our standpoint. Advised that she may pick up her forms here in our office and they can refund her money. She verbalized understanding with no further questions at this time.   Placing red folder with forms given to [place] in front office

## 2022-08-14 ENCOUNTER — Other Ambulatory Visit
Admission: RE | Admit: 2022-08-14 | Discharge: 2022-08-14 | Disposition: A | Discharge status: Home / Self Care | Payer: BC Managed Care – PPO | Attending: Nephrology | Admitting: Nephrology

## 2022-08-14 DIAGNOSIS — Z79899 Other long term (current) drug therapy: Secondary | ICD-10-CM | POA: Diagnosis not present

## 2022-08-14 DIAGNOSIS — Z09 Encounter for follow-up examination after completed treatment for conditions other than malignant neoplasm: Secondary | ICD-10-CM | POA: Diagnosis present

## 2022-08-14 DIAGNOSIS — Z789 Other specified health status: Secondary | ICD-10-CM | POA: Diagnosis not present

## 2022-08-14 DIAGNOSIS — Z114 Encounter for screening for human immunodeficiency virus [HIV]: Secondary | ICD-10-CM | POA: Insufficient documentation

## 2022-08-14 DIAGNOSIS — Z9483 Pancreas transplant status: Secondary | ICD-10-CM | POA: Diagnosis not present

## 2022-08-14 DIAGNOSIS — D899 Disorder involving the immune mechanism, unspecified: Secondary | ICD-10-CM | POA: Insufficient documentation

## 2022-08-14 DIAGNOSIS — E559 Vitamin D deficiency, unspecified: Secondary | ICD-10-CM | POA: Diagnosis not present

## 2022-08-14 DIAGNOSIS — D631 Anemia in chronic kidney disease: Secondary | ICD-10-CM | POA: Diagnosis not present

## 2022-08-14 DIAGNOSIS — B259 Cytomegaloviral disease, unspecified: Secondary | ICD-10-CM | POA: Insufficient documentation

## 2022-08-14 DIAGNOSIS — N390 Urinary tract infection, site not specified: Secondary | ICD-10-CM | POA: Diagnosis not present

## 2022-08-14 DIAGNOSIS — E1129 Type 2 diabetes mellitus with other diabetic kidney complication: Secondary | ICD-10-CM | POA: Insufficient documentation

## 2022-08-14 DIAGNOSIS — Z940 Kidney transplant status: Secondary | ICD-10-CM | POA: Insufficient documentation

## 2022-08-14 LAB — URINALYSIS, COMPLETE (UACMP) WITH MICROSCOPIC
Bacteria, UA: NONE SEEN
Bilirubin Urine: NEGATIVE
Glucose, UA: NEGATIVE mg/dL
Hgb urine dipstick: NEGATIVE
Ketones, ur: NEGATIVE mg/dL
Leukocytes,Ua: NEGATIVE
Nitrite: NEGATIVE
Protein, ur: 100 mg/dL — AB
Specific Gravity, Urine: 1.006 (ref 1.005–1.030)
pH: 6 (ref 5.0–8.0)

## 2022-08-14 LAB — CBC WITH DIFFERENTIAL/PLATELET
Abs Immature Granulocytes: 0.06 10*3/uL (ref 0.00–0.07)
Basophils Absolute: 0 10*3/uL (ref 0.0–0.1)
Basophils Relative: 0 %
Eosinophils Absolute: 0.2 10*3/uL (ref 0.0–0.5)
Eosinophils Relative: 4 %
HCT: 31.3 % — ABNORMAL LOW (ref 36.0–46.0)
Hemoglobin: 9.6 g/dL — ABNORMAL LOW (ref 12.0–15.0)
Immature Granulocytes: 1 %
Lymphocytes Relative: 26 %
Lymphs Abs: 1.2 10*3/uL (ref 0.7–4.0)
MCH: 25.6 pg — ABNORMAL LOW (ref 26.0–34.0)
MCHC: 30.7 g/dL (ref 30.0–36.0)
MCV: 83.5 fL (ref 80.0–100.0)
Monocytes Absolute: 0.5 10*3/uL (ref 0.1–1.0)
Monocytes Relative: 11 %
Neutro Abs: 2.6 10*3/uL (ref 1.7–7.7)
Neutrophils Relative %: 58 %
Platelets: 173 10*3/uL (ref 150–400)
RBC: 3.75 MIL/uL — ABNORMAL LOW (ref 3.87–5.11)
RDW: 14.6 % (ref 11.5–15.5)
WBC: 4.6 10*3/uL (ref 4.0–10.5)
nRBC: 0 % (ref 0.0–0.2)

## 2022-08-14 LAB — PHOSPHORUS: Phosphorus: 4.8 mg/dL — ABNORMAL HIGH (ref 2.5–4.6)

## 2022-08-14 LAB — MAGNESIUM: Magnesium: 1.8 mg/dL (ref 1.7–2.4)

## 2022-08-14 LAB — BASIC METABOLIC PANEL
Anion gap: 11 (ref 5–15)
BUN: 77 mg/dL — ABNORMAL HIGH (ref 6–20)
CO2: 25 mmol/L (ref 22–32)
Calcium: 9.5 mg/dL (ref 8.9–10.3)
Chloride: 98 mmol/L (ref 98–111)
Creatinine, Ser: 3.78 mg/dL — ABNORMAL HIGH (ref 0.44–1.00)
GFR, Estimated: 13 mL/min — ABNORMAL LOW (ref >60)
Glucose, Bld: 179 mg/dL — ABNORMAL HIGH (ref 70–99)
Potassium: 4.4 mmol/L (ref 3.5–5.1)
Sodium: 134 mmol/L — ABNORMAL LOW (ref 135–145)

## 2022-08-14 LAB — PROTEIN / CREATININE RATIO, URINE
Creatinine, Urine: 57 mg/dL
Protein Creatinine Ratio: 1.12 mg/mg{Cre} — ABNORMAL HIGH (ref 0.00–0.15)
Total Protein, Urine: 64 mg/dL

## 2022-08-15 LAB — URINE CULTURE: Culture: NO GROWTH

## 2022-08-15 LAB — TACROLIMUS LEVEL: Tacrolimus (FK506) - LabCorp: 5.5 ng/mL (ref 2.0–20.0)

## 2022-08-22 ENCOUNTER — Ambulatory Visit: Payer: BC Managed Care – PPO | Attending: Physician Assistant | Admitting: Nurse Practitioner

## 2022-08-22 ENCOUNTER — Other Ambulatory Visit
Admission: RE | Admit: 2022-08-22 | Discharge: 2022-08-22 | Disposition: A | Discharge status: Home / Self Care | Payer: BC Managed Care – PPO | Attending: Nephrology | Admitting: Nephrology

## 2022-08-22 ENCOUNTER — Encounter: Payer: Self-pay | Admitting: Nurse Practitioner

## 2022-08-22 VITALS — BP 170/80 | HR 75 | Ht 63.0 in | Wt 159.4 lb

## 2022-08-22 DIAGNOSIS — Z114 Encounter for screening for human immunodeficiency virus [HIV]: Secondary | ICD-10-CM | POA: Diagnosis not present

## 2022-08-22 DIAGNOSIS — E1129 Type 2 diabetes mellitus with other diabetic kidney complication: Secondary | ICD-10-CM | POA: Insufficient documentation

## 2022-08-22 DIAGNOSIS — E559 Vitamin D deficiency, unspecified: Secondary | ICD-10-CM | POA: Diagnosis not present

## 2022-08-22 DIAGNOSIS — T8610 Unspecified complication of kidney transplant: Secondary | ICD-10-CM | POA: Insufficient documentation

## 2022-08-22 DIAGNOSIS — Z9483 Pancreas transplant status: Secondary | ICD-10-CM | POA: Insufficient documentation

## 2022-08-22 DIAGNOSIS — D631 Anemia in chronic kidney disease: Secondary | ICD-10-CM | POA: Diagnosis not present

## 2022-08-22 DIAGNOSIS — R0989 Other specified symptoms and signs involving the circulatory and respiratory systems: Secondary | ICD-10-CM

## 2022-08-22 DIAGNOSIS — Z09 Encounter for follow-up examination after completed treatment for conditions other than malignant neoplasm: Secondary | ICD-10-CM | POA: Insufficient documentation

## 2022-08-22 DIAGNOSIS — Z940 Kidney transplant status: Secondary | ICD-10-CM | POA: Diagnosis not present

## 2022-08-22 DIAGNOSIS — Z789 Other specified health status: Secondary | ICD-10-CM | POA: Diagnosis not present

## 2022-08-22 DIAGNOSIS — R0609 Other forms of dyspnea: Secondary | ICD-10-CM

## 2022-08-22 DIAGNOSIS — D899 Disorder involving the immune mechanism, unspecified: Secondary | ICD-10-CM | POA: Diagnosis present

## 2022-08-22 DIAGNOSIS — B259 Cytomegaloviral disease, unspecified: Secondary | ICD-10-CM | POA: Insufficient documentation

## 2022-08-22 DIAGNOSIS — N390 Urinary tract infection, site not specified: Secondary | ICD-10-CM | POA: Insufficient documentation

## 2022-08-22 DIAGNOSIS — E108 Type 1 diabetes mellitus with unspecified complications: Secondary | ICD-10-CM

## 2022-08-22 DIAGNOSIS — X58XXXD Exposure to other specified factors, subsequent encounter: Secondary | ICD-10-CM | POA: Diagnosis not present

## 2022-08-22 DIAGNOSIS — Z79899 Other long term (current) drug therapy: Secondary | ICD-10-CM | POA: Insufficient documentation

## 2022-08-22 DIAGNOSIS — N184 Chronic kidney disease, stage 4 (severe): Secondary | ICD-10-CM | POA: Diagnosis not present

## 2022-08-22 DIAGNOSIS — G4733 Obstructive sleep apnea (adult) (pediatric): Secondary | ICD-10-CM

## 2022-08-22 LAB — URINALYSIS, COMPLETE (UACMP) WITH MICROSCOPIC
Bacteria, UA: NONE SEEN
Bilirubin Urine: NEGATIVE
Glucose, UA: NEGATIVE mg/dL
Hgb urine dipstick: NEGATIVE
Ketones, ur: NEGATIVE mg/dL
Leukocytes,Ua: NEGATIVE
Nitrite: NEGATIVE
Protein, ur: 100 mg/dL — AB
Specific Gravity, Urine: 1.006 (ref 1.005–1.030)
pH: 6 (ref 5.0–8.0)

## 2022-08-22 LAB — CBC WITH DIFFERENTIAL/PLATELET
Abs Immature Granulocytes: 0.39 10*3/uL — ABNORMAL HIGH (ref 0.00–0.07)
Basophils Absolute: 0 10*3/uL (ref 0.0–0.1)
Basophils Relative: 0 %
Eosinophils Absolute: 0.1 10*3/uL (ref 0.0–0.5)
Eosinophils Relative: 3 %
HCT: 30.7 % — ABNORMAL LOW (ref 36.0–46.0)
Hemoglobin: 9.5 g/dL — ABNORMAL LOW (ref 12.0–15.0)
Immature Granulocytes: 8 %
Lymphocytes Relative: 18 %
Lymphs Abs: 0.9 10*3/uL (ref 0.7–4.0)
MCH: 25.7 pg — ABNORMAL LOW (ref 26.0–34.0)
MCHC: 30.9 g/dL (ref 30.0–36.0)
MCV: 83.2 fL (ref 80.0–100.0)
Monocytes Absolute: 0.5 10*3/uL (ref 0.1–1.0)
Monocytes Relative: 10 %
Neutro Abs: 3.2 10*3/uL (ref 1.7–7.7)
Neutrophils Relative %: 61 %
Platelets: 153 10*3/uL (ref 150–400)
RBC: 3.69 MIL/uL — ABNORMAL LOW (ref 3.87–5.11)
RDW: 14.6 % (ref 11.5–15.5)
Smear Review: NORMAL
WBC: 5.1 10*3/uL (ref 4.0–10.5)
nRBC: 0 % (ref 0.0–0.2)

## 2022-08-22 LAB — PHOSPHORUS: Phosphorus: 4.3 mg/dL (ref 2.5–4.6)

## 2022-08-22 LAB — BASIC METABOLIC PANEL
Anion gap: 8 (ref 5–15)
BUN: 54 mg/dL — ABNORMAL HIGH (ref 6–20)
CO2: 23 mmol/L (ref 22–32)
Calcium: 9.4 mg/dL (ref 8.9–10.3)
Chloride: 104 mmol/L (ref 98–111)
Creatinine, Ser: 2.9 mg/dL — ABNORMAL HIGH (ref 0.44–1.00)
GFR, Estimated: 18 mL/min — ABNORMAL LOW (ref >60)
Glucose, Bld: 109 mg/dL — ABNORMAL HIGH (ref 70–99)
Potassium: 4.6 mmol/L (ref 3.5–5.1)
Sodium: 135 mmol/L (ref 135–145)

## 2022-08-22 LAB — PROTEIN / CREATININE RATIO, URINE
Creatinine, Urine: 59 mg/dL
Protein Creatinine Ratio: 2.22 mg/mg{Cre} — ABNORMAL HIGH (ref 0.00–0.15)
Total Protein, Urine: 131 mg/dL

## 2022-08-22 LAB — MAGNESIUM: Magnesium: 1.9 mg/dL (ref 1.7–2.4)

## 2022-08-22 NOTE — Patient Instructions (Signed)
Medication Instructions:  No changes at this time.   *If you need a refill on your cardiac medications before your next appointment, please call your pharmacy*   Lab Work: None  If you have labs (blood work) drawn today and your tests are completely normal, you will receive your results only by: MyChart Message (if you have MyChart) OR A paper copy in the mail If you have any lab test that is abnormal or we need to change your treatment, we will call you to review the results.   Testing/Procedures: None   Follow-Up: At [facility], you and your health needs are our priority.  As part of our continuing mission to provide you with exceptional heart care, we have created designated Provider Care Teams.  These Care Teams include your primary Cardiologist (physician) and Advanced Practice Providers (APPs -  Physician Assistants and Nurse Practitioners) who all work together to provide you with the care you need, when you need it.  Your next appointment:   3 month(s)  The format for your next appointment:   In Person  Provider:   Christopher End, MD or Christopher Berge, NP      Important Information About Sugar       

## 2022-08-22 NOTE — Progress Notes (Signed)
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  Past Medical History    Past Medical History:  Diagnosis Date   Chronic sinusitis    CKD (chronic kidney disease), stage IV ([place])    a. s/p renal transplant.   Diabetes mellitus without complication (HCC)    I4P, 7.0 last check; type 1, has insulin pump; subsequently has neuropathy;    Diabetic neuropathy (HCC)    Diastolic dysfunction    a. 04/2022 Echo: EF 60-65%, no rwma, GrI DD, nl RV fxn, RVSP 42.59mHg, mild MR, mild-mod TR.   GERD (gastroesophageal reflux disease)    Hyperlipemia    Hyperthyroidism    Insulin pump in place    Labile Hypertension    Nausea and vomiting    Obesity    Osteoporosis    Stroke ([facility]    Tremor    Past Surgical History:  Procedure Laterality Date   CATARACT EXTRACTION     COLONOSCOPY WITH PROPOFOL N/A 10/19/2016   Procedure: COLONOSCOPY WITH PROPOFOL;  Surgeon: MLollie Sails MD;  [facility]  Service: Endoscopy;  Laterality: N/A;   EYE SURGERY     bilateral (cataracts)   FOOT SURGERY     FOOT SURGERY     KIDNEY TRANSPLANT  2007    Allergies  Allergies  Allergen Reactions   Erythromycin    Floxin [Ofloxacin]    Latex    Levaquin [Levofloxacin In D5w]    Moxifloxacin    Sulfa Antibiotics    Tape     ADHESIVE     History of Present Illness    56year old female with above past medical history including hypertension, hyperlipidemia, stroke, brittle type 1 diabetes, 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  She establish care in our office in August 2022 in the setting of chest and left arm pain.  During that visit, she became unresponsive in the setting of profound hypoglycemia and required ED evaluation and management.  She has some degree of chronic mild dyspnea on exertion with echocardiogram in June 2023 showing an EF of 60 to 65% without regional wall motion abnormalities, grade 1 diastolic dysfunction, normal RV function, RVSP of 42.7 mmHg, mild MR, and mild to moderate TR.  She has been followed over the summer 2023 in the setting of elevated blood pressures.  After initial intolerance of carvedilol (nausea and dizziness), she was retrialed on low-dose carvedilol at July 2023 visit.  This was subsequently titrated to 6.25 mg and later 12.5 mg twice daily.  [facility] and hyperkalemia.  Antihypertensives were initially held in the setting of low blood pressures but then she subsequently had significant hypertension.  She has been following blood pressure since discharge and for much of September, she was having somewhat low blood pressure sometimes in the 90s but mostly in the low 100s and over the past few weeks, she has noted transient in the 130-140 range.  Since her hospitalization, she has steadily increased her activity.  She does have some degree of dyspnea on exertion but feels that this is improving as her strength improved.  She does not experience chest pain and denies palpitations, PND, orthopnea, dizziness, syncope, edema, or early satiety.  Home Medications    Current Outpatient Medications  Medication Sig Dispense Refill   aspirin 81 MG tablet Take 81 mg by mouth daily.     Biotin 1 MG CAPS Take by mouth daily.     carvedilol (COREG) 12.5 MG tablet Take 1 tablet (12.5 mg total) by mouth 2 (two) times daily. 180 tablet 3   Cholecalciferol (VITAMIN D3) 2000 UNITS CHEW Chew by  mouth daily.     cyanocobalamin 1000 MCG tablet Take 1,000 mcg by mouth daily.     fexofenadine (ALLEGRA) 180 MG tablet Take 180 mg by mouth daily.     fluocinolone (SYNALAR) 0.01 % external solution Apply topically as needed.     Glucagon (BAQSIMI ONE PACK) 3 MG/DOSE POWD 1 spray in one nostril in case of severe hypoglycemia     glucagon 1 MG injection Inject 1 mg into the vein once as needed.     insulin aspart (NOVOLOG) 100 UNIT/ML injection USE UP TO 65 UNITS DAILY IN PUMP.     montelukast (SINGULAIR) 10 MG tablet Take 10 mg by mouth at bedtime.     mycophenolate (CELLCEPT) 250 MG capsule Take by mouth 2 (two) times daily.     Olopatadine-Mometasone (RYALTRIS) [id number] MCG/ACT SUSP Place 2 sprays into the nose daily.     rosuvastatin (CRESTOR) 5 MG tablet Take 5 mg by mouth daily.     tacrolimus (PROGRAF) 1 MG capsule Take 5 mg by mouth daily.     Azelastine HCl 137 MCG/SPRAY SOLN Place 1 spray into the nose 2 (two) times daily.     latanoprost (XALATAN) 0.005 % ophthalmic solution Place 1 drop into the right eye at bedtime. (Patient not taking: Reported on 04/18/2022)     lisinopril (ZESTRIL) 40 MG tablet Take 1 tablet (40 mg total) by mouth daily. (Patient not taking: Reported on 08/22/2022) 90 tablet 3   No current facility-administered medications for this visit.     Review of Systems    Chronic dyspnea exertion.  She denies chest pain, palpitations, PND, orthopnea, dizziness, syncope, edema, or early satiety.  All other systems reviewed and are otherwise negative except as noted above.    Physical Exam    VS:  BP (!) 172/76 (BP Location: Right Arm, Patient Position: Sitting, Cuff Size: Normal)   Pulse 75   Ht '5\' 3"'$  (1.6 m)   Wt 159 lb 6.4 oz (72.3 kg)   SpO2 99%   BMI 28.24 kg/m  , BMI Body mass index is 28.24 kg/m.     Vitals:   08/22/22 1522 08/22/22 1721  BP: (!) 172/76 (!) 170/80  Pulse: 75   SpO2: 99%     GEN: Well nourished, well developed, in no acute  distress. HEENT: normal. Neck: Supple, no JVD, carotid bruits, or masses. Cardiac: RRR, 2/6 systolic murmur at the left upper sternal border, no rubs or gallops. No clubbing, cyanosis, edema.  Radials/DP/PT 2+ and equal bilaterally.  Respiratory:  Respirations regular and unlabored, clear to auscultation bilaterally. GI: Soft, nontender, nondistended, BS + x 4. MS: no deformity or atrophy. Skin: warm and dry, no rash. Neuro:  Strength and sensation are intact. Psych: Normal affect.  Accessory Clinical Findings    Lab Results  Component Value Date   WBC 5.1  08/22/2022   HGB 9.5 (L) 08/22/2022   HCT 30.7 (L) 08/22/2022   MCV 83.2 08/22/2022   PLT 153 08/22/2022   Lab Results  Component Value Date   CREATININE 2.90 (H) 08/22/2022   BUN 54 (H) 08/22/2022   NA 135 08/22/2022   K 4.6 08/22/2022   CL 104 08/22/2022   CO2 23 08/22/2022   Lab Results  Component Value Date   ALT 19 10/19/2018   AST 24 10/19/2018   GGT 30 10/19/2018   ALKPHOS 114 10/19/2018   BILITOT 0.9 10/19/2018   Lab Results  Component Value Date   CHOL 160 10/19/2018   HDL 74 10/19/2018   LDLCALC 77 10/19/2018   LDLDIRECT 74 07/13/2018   TRIG 43 10/19/2018   CHOLHDL 2.2 10/19/2018     Assessment & Plan    1.  Labile hypertension: Patient with a history of labile hypertension and elevated blood pressure today at 170/80 on repeat.  At home, her trends have been much better though over the past few weeks, she has had more numbers in the 130-140 range.  Earlier in the month, when she was trending in the low 100s and sometimes 90s, she says she felt somewhat wiped out.  We agreed that instead of adjusting medication today, she will continue to trend her blood pressures for another week and provide [place] with additional information.  If she continues to trend greater than 140, will plan to increase carvedilol to 18.75 mg twice daily.  She is no longer on lisinopril in the setting of AKI and hyperkalemia during DKA  admission.  Creatinine has been improving in the outpatient setting and was 2.9 earlier today.  2.  Chronic dyspnea on exertion: Overall stable.  Echo in August 2020 showed normal LV function with mildly elevated RVSP.  She does not experience chest pain though did report chest pain at some point in 2022.  She has never undergone ischemic testing.  She feels as though she is still recovering from her DKA admission and late August and in the absence of symptoms, agreed to defer any testing at this time.  3.  Stage IV chronic kidney disease status post renal transplant: Followed closely by [facility] nephrology.  4.  Type 1 diabetes mellitus: Followed closely by endocrinology with DKA admission in August.  Sugars more recently have been stable.  5.  Obstructive sleep apnea: Uses CPAP.  6.  Disposition: Patient to provide his blood pressure recordings in about a week.  Plan to follow-up in 6 to 8 weeks.  Murray Hodgkins, NP 08/22/2022, 5:15 PM

## 2022-08-23 LAB — URINE CULTURE: Culture: NO GROWTH

## 2022-08-24 LAB — TACROLIMUS LEVEL: Tacrolimus (FK506) - LabCorp: 5.7 ng/mL (ref 2.0–20.0)

## 2022-08-28 ENCOUNTER — Other Ambulatory Visit
Admission: RE | Admit: 2022-08-28 | Discharge: 2022-08-28 | Disposition: A | Discharge status: Home / Self Care | Payer: BC Managed Care – PPO | Attending: Nephrology | Admitting: Nephrology

## 2022-08-28 DIAGNOSIS — D899 Disorder involving the immune mechanism, unspecified: Secondary | ICD-10-CM | POA: Insufficient documentation

## 2022-08-28 DIAGNOSIS — Z09 Encounter for follow-up examination after completed treatment for conditions other than malignant neoplasm: Secondary | ICD-10-CM | POA: Diagnosis not present

## 2022-08-28 DIAGNOSIS — N189 Chronic kidney disease, unspecified: Secondary | ICD-10-CM | POA: Diagnosis not present

## 2022-08-28 DIAGNOSIS — B259 Cytomegaloviral disease, unspecified: Secondary | ICD-10-CM | POA: Insufficient documentation

## 2022-08-28 DIAGNOSIS — Z940 Kidney transplant status: Secondary | ICD-10-CM | POA: Insufficient documentation

## 2022-08-28 DIAGNOSIS — Z9483 Pancreas transplant status: Secondary | ICD-10-CM | POA: Diagnosis not present

## 2022-08-28 DIAGNOSIS — Z114 Encounter for screening for human immunodeficiency virus [HIV]: Secondary | ICD-10-CM | POA: Insufficient documentation

## 2022-08-28 DIAGNOSIS — E1129 Type 2 diabetes mellitus with other diabetic kidney complication: Secondary | ICD-10-CM | POA: Insufficient documentation

## 2022-08-28 DIAGNOSIS — E559 Vitamin D deficiency, unspecified: Secondary | ICD-10-CM | POA: Diagnosis not present

## 2022-08-28 DIAGNOSIS — Z789 Other specified health status: Secondary | ICD-10-CM | POA: Insufficient documentation

## 2022-08-28 DIAGNOSIS — D631 Anemia in chronic kidney disease: Secondary | ICD-10-CM | POA: Diagnosis not present

## 2022-08-28 DIAGNOSIS — Z79899 Other long term (current) drug therapy: Secondary | ICD-10-CM | POA: Diagnosis not present

## 2022-08-28 LAB — CBC WITH DIFFERENTIAL/PLATELET
Abs Immature Granulocytes: 0.11 10*3/uL — ABNORMAL HIGH (ref 0.00–0.07)
Basophils Absolute: 0 10*3/uL (ref 0.0–0.1)
Basophils Relative: 0 %
Eosinophils Absolute: 0 10*3/uL (ref 0.0–0.5)
Eosinophils Relative: 1 %
HCT: 29 % — ABNORMAL LOW (ref 36.0–46.0)
Hemoglobin: 9 g/dL — ABNORMAL LOW (ref 12.0–15.0)
Immature Granulocytes: 3 %
Lymphocytes Relative: 22 %
Lymphs Abs: 0.7 10*3/uL (ref 0.7–4.0)
MCH: 25.6 pg — ABNORMAL LOW (ref 26.0–34.0)
MCHC: 31 g/dL (ref 30.0–36.0)
MCV: 82.6 fL (ref 80.0–100.0)
Monocytes Absolute: 0.6 10*3/uL (ref 0.1–1.0)
Monocytes Relative: 18 %
Neutro Abs: 1.9 10*3/uL (ref 1.7–7.7)
Neutrophils Relative %: 56 %
Platelets: 147 10*3/uL — ABNORMAL LOW (ref 150–400)
RBC: 3.51 MIL/uL — ABNORMAL LOW (ref 3.87–5.11)
RDW: 14.8 % (ref 11.5–15.5)
WBC: 3.4 10*3/uL — ABNORMAL LOW (ref 4.0–10.5)
nRBC: 0 % (ref 0.0–0.2)

## 2022-08-28 LAB — BASIC METABOLIC PANEL
Anion gap: 8 (ref 5–15)
BUN: 45 mg/dL — ABNORMAL HIGH (ref 6–20)
CO2: 21 mmol/L — ABNORMAL LOW (ref 22–32)
Calcium: 9 mg/dL (ref 8.9–10.3)
Chloride: 107 mmol/L (ref 98–111)
Creatinine, Ser: 3.03 mg/dL — ABNORMAL HIGH (ref 0.44–1.00)
GFR, Estimated: 17 mL/min — ABNORMAL LOW (ref >60)
Glucose, Bld: 138 mg/dL — ABNORMAL HIGH (ref 70–99)
Potassium: 4.5 mmol/L (ref 3.5–5.1)
Sodium: 136 mmol/L (ref 135–145)

## 2022-08-28 LAB — PROTEIN / CREATININE RATIO, URINE
Creatinine, Urine: 91 mg/dL
Protein Creatinine Ratio: 2.58 mg/mg{Cre} — ABNORMAL HIGH (ref 0.00–0.15)
Total Protein, Urine: 235 mg/dL

## 2022-08-28 LAB — URINALYSIS, COMPLETE (UACMP) WITH MICROSCOPIC
Bacteria, UA: NONE SEEN
Bilirubin Urine: NEGATIVE
Glucose, UA: NEGATIVE mg/dL
Hgb urine dipstick: NEGATIVE
Ketones, ur: NEGATIVE mg/dL
Leukocytes,Ua: NEGATIVE
Nitrite: NEGATIVE
Protein, ur: >=300 mg/dL — AB
Specific Gravity, Urine: 1.01 (ref 1.005–1.030)
pH: 5 (ref 5.0–8.0)

## 2022-08-28 LAB — MAGNESIUM: Magnesium: 1.7 mg/dL (ref 1.7–2.4)

## 2022-08-28 LAB — PHOSPHORUS: Phosphorus: 4.1 mg/dL (ref 2.5–4.6)

## 2022-08-29 LAB — URINE CULTURE: Culture: NO GROWTH

## 2022-08-29 LAB — TACROLIMUS LEVEL: Tacrolimus (FK506) - LabCorp: 4.4 ng/mL (ref 2.0–20.0)

## 2022-09-22 ENCOUNTER — Other Ambulatory Visit: Payer: Self-pay

## 2022-09-22 MED ORDER — CARVEDILOL 25 MG PO TABS: 25.0000 mg | ORAL_TABLET | Freq: Two times a day (BID) | ORAL | 0 refills | Status: DC

## 2022-10-05 ENCOUNTER — Other Ambulatory Visit
Admission: RE | Admit: 2022-10-05 | Discharge: 2022-10-05 | Disposition: A | Discharge status: Home / Self Care | Payer: BC Managed Care – PPO | Source: Ambulatory Visit | Attending: Nephrology | Admitting: Nephrology

## 2022-10-05 DIAGNOSIS — Z9483 Pancreas transplant status: Secondary | ICD-10-CM | POA: Insufficient documentation

## 2022-10-05 DIAGNOSIS — Z79899 Other long term (current) drug therapy: Secondary | ICD-10-CM | POA: Insufficient documentation

## 2022-10-05 DIAGNOSIS — T8610 Unspecified complication of kidney transplant: Secondary | ICD-10-CM | POA: Insufficient documentation

## 2022-10-05 DIAGNOSIS — Z114 Encounter for screening for human immunodeficiency virus [HIV]: Secondary | ICD-10-CM | POA: Insufficient documentation

## 2022-10-05 DIAGNOSIS — N390 Urinary tract infection, site not specified: Secondary | ICD-10-CM | POA: Insufficient documentation

## 2022-10-05 DIAGNOSIS — B259 Cytomegaloviral disease, unspecified: Secondary | ICD-10-CM | POA: Insufficient documentation

## 2022-10-05 DIAGNOSIS — D631 Anemia in chronic kidney disease: Secondary | ICD-10-CM | POA: Insufficient documentation

## 2022-10-05 DIAGNOSIS — Z09 Encounter for follow-up examination after completed treatment for conditions other than malignant neoplasm: Secondary | ICD-10-CM | POA: Insufficient documentation

## 2022-10-05 DIAGNOSIS — Z940 Kidney transplant status: Secondary | ICD-10-CM | POA: Insufficient documentation

## 2022-10-05 DIAGNOSIS — E1129 Type 2 diabetes mellitus with other diabetic kidney complication: Secondary | ICD-10-CM | POA: Insufficient documentation

## 2022-10-05 DIAGNOSIS — E559 Vitamin D deficiency, unspecified: Secondary | ICD-10-CM | POA: Insufficient documentation

## 2022-10-05 DIAGNOSIS — D899 Disorder involving the immune mechanism, unspecified: Secondary | ICD-10-CM | POA: Insufficient documentation

## 2022-10-05 DIAGNOSIS — Z789 Other specified health status: Secondary | ICD-10-CM | POA: Insufficient documentation

## 2022-10-05 LAB — URINALYSIS, COMPLETE (UACMP) WITH MICROSCOPIC
Bacteria, UA: NONE SEEN
Bilirubin Urine: NEGATIVE
Glucose, UA: 50 mg/dL — AB
Hgb urine dipstick: NEGATIVE
Ketones, ur: NEGATIVE mg/dL
Leukocytes,Ua: NEGATIVE
Nitrite: NEGATIVE
Protein, ur: 100 mg/dL — AB
Specific Gravity, Urine: 1.011 (ref 1.005–1.030)
pH: 5 (ref 5.0–8.0)

## 2022-10-05 LAB — PROTEIN / CREATININE RATIO, URINE
Creatinine, Urine: 87 mg/dL
Protein Creatinine Ratio: 1.71 mg/mg{Cre} — ABNORMAL HIGH (ref 0.00–0.15)
Total Protein, Urine: 149 mg/dL

## 2022-10-05 LAB — CBC WITH DIFFERENTIAL/PLATELET
Abs Immature Granulocytes: 0.02 10*3/uL (ref 0.00–0.07)
Basophils Absolute: 0 10*3/uL (ref 0.0–0.1)
Basophils Relative: 0 %
Eosinophils Absolute: 0.2 10*3/uL (ref 0.0–0.5)
Eosinophils Relative: 4 %
HCT: 32.9 % — ABNORMAL LOW (ref 36.0–46.0)
Hemoglobin: 9.8 g/dL — ABNORMAL LOW (ref 12.0–15.0)
Immature Granulocytes: 0 %
Lymphocytes Relative: 39 %
Lymphs Abs: 1.8 10*3/uL (ref 0.7–4.0)
MCH: 25.2 pg — ABNORMAL LOW (ref 26.0–34.0)
MCHC: 29.8 g/dL — ABNORMAL LOW (ref 30.0–36.0)
MCV: 84.6 fL (ref 80.0–100.0)
Monocytes Absolute: 0.4 10*3/uL (ref 0.1–1.0)
Monocytes Relative: 9 %
Neutro Abs: 2.3 10*3/uL (ref 1.7–7.7)
Neutrophils Relative %: 48 %
Platelets: 177 10*3/uL (ref 150–400)
RBC: 3.89 MIL/uL (ref 3.87–5.11)
RDW: 15.9 % — ABNORMAL HIGH (ref 11.5–15.5)
WBC: 4.7 10*3/uL (ref 4.0–10.5)
nRBC: 0 % (ref 0.0–0.2)

## 2022-10-05 LAB — PHOSPHORUS: Phosphorus: 4.7 mg/dL — ABNORMAL HIGH (ref 2.5–4.6)

## 2022-10-05 LAB — BASIC METABOLIC PANEL
Anion gap: 5 (ref 5–15)
BUN: 62 mg/dL — ABNORMAL HIGH (ref 6–20)
CO2: 24 mmol/L (ref 22–32)
Calcium: 9.4 mg/dL (ref 8.9–10.3)
Chloride: 113 mmol/L — ABNORMAL HIGH (ref 98–111)
Creatinine, Ser: 2.9 mg/dL — ABNORMAL HIGH (ref 0.44–1.00)
GFR, Estimated: 18 mL/min — ABNORMAL LOW (ref >60)
Glucose, Bld: 63 mg/dL — ABNORMAL LOW (ref 70–99)
Potassium: 4.7 mmol/L (ref 3.5–5.1)
Sodium: 142 mmol/L (ref 135–145)

## 2022-10-05 LAB — MAGNESIUM: Magnesium: 1.8 mg/dL (ref 1.7–2.4)

## 2022-10-06 LAB — URINE CULTURE: Culture: NO GROWTH

## 2022-10-07 LAB — TACROLIMUS LEVEL: Tacrolimus (FK506) - LabCorp: 7.1 ng/mL (ref 2.0–20.0)

## 2022-11-22 NOTE — Progress Notes (Signed)
Follow-up Outpatient Visit Date: 11/23/2022  Primary Care Provider: Derinda Late, MD [phone] [facility] [place] [address]  Chief Complaint: Hypertension  HPI:  Ms. Graffius is a 57 y.o. female with history of hypertension, hyperlipidemia, stroke, type 1 diabetes mellitus, chronic kidney disease status post renal transplant, GERD, and Bell's palsy, who presents for follow-up of labile hypertension.  She was last seen in our office in October by Ignacia Bayley, NP, at which time she was feeling fairly well.  She had been hospitalized over the summer with DKA and hypotension, prompting discontinuation of her antihypertensive regimen.  Due to subsequent spikes in her blood pressure, antihypertensives were resumed.  Blood pressure was significantly elevated at 172/76 at her last visit.  However, she reported systolic blood pressures in the low 100s and 90s earlier in the month.  Medication changes were deferred.  Today, Ms. Villena reports that she feels fairly well though she continues to complain of intermittent dizziness.  She has not passed out or fallen.  She notes that her blood pressures remain elevated at home albeit typically lower than today's readings.  Most recent home blood pressures were 151/76 and 156/80.  She has not had any chest pain, shortness of breath, palpitations, lightheadedness, or edema.  She continues to follow with Dr. Suzan Nailer at [facility] nephrology.  She notes that due to declining renal function, she is planning to restart pretransplant evaluation in case she were to need a second transplant.  --------------------------------------------------------------------------------------------------  Past Medical History:  Diagnosis Date   Chronic sinusitis    CKD (chronic kidney disease), stage IV ([place])    a. s/p renal transplant.   Diabetes mellitus without complication (HCC)    U2P, 7.0 last check; type 1, has insulin pump;  subsequently has neuropathy;    Diabetic neuropathy (HCC)    Diastolic dysfunction    a. 04/2022 Echo: EF 60-65%, no rwma, GrI DD, nl RV fxn, RVSP 42.100mHg, mild MR, mild-mod TR.   GERD (gastroesophageal reflux disease)    Hyperlipemia    Hyperthyroidism    Insulin pump in place    Labile Hypertension    Nausea and vomiting    Obesity    Osteoporosis    Stroke ([facility]    Tremor    Past Surgical History:  Procedure Laterality Date   CATARACT EXTRACTION     COLONOSCOPY WITH PROPOFOL N/A 10/19/2016   Procedure: COLONOSCOPY WITH PROPOFOL;  Surgeon: MLollie Sails MD;  [facility];  Service: Endoscopy;  Laterality: N/A;   EYE SURGERY     bilateral (cataracts)   FOOT SURGERY     FOOT SURGERY     KIDNEY TRANSPLANT  2007    Current Meds  Medication Sig   aspirin 81 MG tablet Take 81 mg by mouth daily.   Biotin 1 MG CAPS Take by mouth daily.   carvedilol (COREG) 25 MG tablet Take 1 tablet (25 mg total) by mouth 2 (two) times daily.   Cholecalciferol (VITAMIN D3) 2000 UNITS CHEW Chew by mouth daily.   cyanocobalamin 1000 MCG tablet Take 1,000 mcg by mouth daily.   fexofenadine (ALLEGRA) 180 MG tablet Take 180 mg by mouth daily.   fluocinolone (SYNALAR) 0.01 % external solution Apply topically as needed.   fluticasone (FLONASE) 50 MCG/ACT nasal spray Place 2 sprays into both nostrils daily.   Glucagon (BAQSIMI ONE PACK) 3 MG/DOSE POWD 1 spray in one nostril in case of severe hypoglycemia  glucagon 1 MG injection Inject 1 mg into the vein once as needed.   insulin aspart (NOVOLOG) 100 UNIT/ML injection USE UP TO 65 UNITS DAILY IN PUMP.   LUMIGAN 0.01 % SOLN Place 1 drop into the right eye at bedtime.   montelukast (SINGULAIR) 10 MG tablet Take 10 mg by mouth at bedtime.   mycophenolate (CELLCEPT) 250 MG capsule Take by mouth 2 (two) times daily.   Olopatadine-Mometasone (RYALTRIS) [id number] MCG/ACT SUSP Place 2 sprays into the nose daily.   rosuvastatin (CRESTOR) 5 MG  tablet Take 5 mg by mouth daily.   tacrolimus (PROGRAF) 1 MG capsule Take 5 mg by mouth daily.    Allergies: Erythromycin, Floxin [ofloxacin], Latex, Levaquin [levofloxacin in d5w], Moxifloxacin, Sulfa antibiotics, and Tape  Social History   Tobacco Use   Smoking status: Never   Smokeless tobacco: Never  Vaping Use   Vaping Use: Never used  Substance Use Topics   Alcohol use: No   Drug use: No    Family History  Problem Relation Age of Onset   Parkinson's disease Mother    Breast cancer Maternal Aunt        in lymph nodes    Review of Systems: A 12-system review of systems was performed and was negative except as noted in the HPI.  --------------------------------------------------------------------------------------------------  Physical Exam: BP (!) 200/88 (BP Location: Right Arm, Patient Position: Sitting, Cuff Size: Normal)   Pulse 67   Ht '5\' 3"'$  (1.6 m)   Wt 150 lb (68 kg)   SpO2 96%   BMI 26.57 kg/m  Repeat blood pressure: 200/72  General:  NAD. Neck: No JVD or HJR. Lungs: Clear to auscultation bilaterally without wheezes or crackles. Heart: Regular rate and rhythm without murmurs, rubs, or gallops. Abdomen: Soft, nontender, nondistended. Extremities: No lower extremity edema.  EKG: Normal sinus rhythm with possible left atrial enlargement.  No significant change from prior tracing on 04/18/2022.  Lab Results  Component Value Date   WBC 4.1 11/23/2022   HGB 10.0 (L) 11/23/2022   HCT 32.0 (L) 11/23/2022   MCV 82.1 11/23/2022   PLT 165 11/23/2022    Lab Results  Component Value Date   NA 142 10/05/2022   K 4.7 10/05/2022   CL 113 (H) 10/05/2022   CO2 24 10/05/2022   BUN 62 (H) 10/05/2022   CREATININE 2.90 (H) 10/05/2022   GLUCOSE 63 (L) 10/05/2022   ALT 19 10/19/2018    Lab Results  Component Value Date   CHOL 160 10/19/2018   HDL 74 10/19/2018   LDLCALC 77 10/19/2018   LDLDIRECT 74 07/13/2018   TRIG 43 10/19/2018   CHOLHDL 2.2 10/19/2018     --------------------------------------------------------------------------------------------------  ASSESSMENT AND PLAN: Uncontrolled hypertension: Blood pressure remains uncontrolled and was severely elevated on initial assessment in the office.  As Ms. Sopko was asymptomatic, we administered clonidine 0.1 mg with improvement in blood pressure to 172/70.  I have spoken with her nephrologist at [facility], Dr. Suzan Nailer, and we have agreed to add amlodipine 5 mg daily to Ms. Justice's current regimen of carvedilol 25 mg twice daily.  Due to the finding function of her renal transplant, we will defer rechallenging her with an ACE inhibitor.  I will have Ms. Zynda follow-up in about 2 weeks to reassess her blood pressure.  She should continue to follow closely with [facility] nephrology as well.  Chronic kidney disease status post renal transplant: Kidney function continues to decline.  Importance of blood pressure control reinforced.  Labs were drawn earlier today by Dr. Suzan Nailer to reassess renal function, blood counts, and immunosuppression.  Type 1 diabetes mellitus: Per Dr. Honor Junes.  Follow-up: Return to clinic in 2 weeks.  Nelva Bush, MD 11/23/2022 9:42 AM

## 2022-11-23 ENCOUNTER — Other Ambulatory Visit
Admission: RE | Admit: 2022-11-23 | Discharge: 2022-11-23 | Disposition: A | Discharge status: Home / Self Care | Payer: BC Managed Care – PPO | Attending: Nephrology | Admitting: Nephrology

## 2022-11-23 ENCOUNTER — Ambulatory Visit: Payer: BC Managed Care – PPO | Attending: Internal Medicine | Admitting: Internal Medicine

## 2022-11-23 ENCOUNTER — Encounter: Payer: Self-pay | Admitting: Internal Medicine

## 2022-11-23 VITALS — BP 172/70 | HR 67 | Ht 63.0 in | Wt 150.0 lb

## 2022-11-23 DIAGNOSIS — E108 Type 1 diabetes mellitus with unspecified complications: Secondary | ICD-10-CM

## 2022-11-23 DIAGNOSIS — D899 Disorder involving the immune mechanism, unspecified: Secondary | ICD-10-CM | POA: Insufficient documentation

## 2022-11-23 DIAGNOSIS — N390 Urinary tract infection, site not specified: Secondary | ICD-10-CM | POA: Insufficient documentation

## 2022-11-23 DIAGNOSIS — Z114 Encounter for screening for human immunodeficiency virus [HIV]: Secondary | ICD-10-CM | POA: Diagnosis not present

## 2022-11-23 DIAGNOSIS — B259 Cytomegaloviral disease, unspecified: Secondary | ICD-10-CM | POA: Insufficient documentation

## 2022-11-23 DIAGNOSIS — Z940 Kidney transplant status: Secondary | ICD-10-CM | POA: Diagnosis not present

## 2022-11-23 DIAGNOSIS — E1129 Type 2 diabetes mellitus with other diabetic kidney complication: Secondary | ICD-10-CM | POA: Diagnosis not present

## 2022-11-23 DIAGNOSIS — Z9483 Pancreas transplant status: Secondary | ICD-10-CM | POA: Diagnosis not present

## 2022-11-23 DIAGNOSIS — D631 Anemia in chronic kidney disease: Secondary | ICD-10-CM | POA: Diagnosis not present

## 2022-11-23 DIAGNOSIS — Z79899 Other long term (current) drug therapy: Secondary | ICD-10-CM | POA: Insufficient documentation

## 2022-11-23 DIAGNOSIS — N189 Chronic kidney disease, unspecified: Secondary | ICD-10-CM | POA: Diagnosis not present

## 2022-11-23 DIAGNOSIS — I10 Essential (primary) hypertension: Secondary | ICD-10-CM

## 2022-11-23 DIAGNOSIS — N184 Chronic kidney disease, stage 4 (severe): Secondary | ICD-10-CM

## 2022-11-23 LAB — CBC WITH DIFFERENTIAL/PLATELET
Abs Immature Granulocytes: 0.01 10*3/uL (ref 0.00–0.07)
Basophils Absolute: 0 10*3/uL (ref 0.0–0.1)
Basophils Relative: 1 %
Eosinophils Absolute: 0.1 10*3/uL (ref 0.0–0.5)
Eosinophils Relative: 3 %
HCT: 32 % — ABNORMAL LOW (ref 36.0–46.0)
Hemoglobin: 10 g/dL — ABNORMAL LOW (ref 12.0–15.0)
Immature Granulocytes: 0 %
Lymphocytes Relative: 31 %
Lymphs Abs: 1.3 10*3/uL (ref 0.7–4.0)
MCH: 25.6 pg — ABNORMAL LOW (ref 26.0–34.0)
MCHC: 31.3 g/dL (ref 30.0–36.0)
MCV: 82.1 fL (ref 80.0–100.0)
Monocytes Absolute: 0.5 10*3/uL (ref 0.1–1.0)
Monocytes Relative: 11 %
Neutro Abs: 2.3 10*3/uL (ref 1.7–7.7)
Neutrophils Relative %: 54 %
Platelets: 165 10*3/uL (ref 150–400)
RBC: 3.9 MIL/uL (ref 3.87–5.11)
RDW: 15.1 % (ref 11.5–15.5)
WBC: 4.1 10*3/uL (ref 4.0–10.5)
nRBC: 0 % (ref 0.0–0.2)

## 2022-11-23 LAB — BASIC METABOLIC PANEL
Anion gap: 8 (ref 5–15)
BUN: 70 mg/dL — ABNORMAL HIGH (ref 6–20)
CO2: 20 mmol/L — ABNORMAL LOW (ref 22–32)
Calcium: 8.9 mg/dL (ref 8.9–10.3)
Chloride: 106 mmol/L (ref 98–111)
Creatinine, Ser: 3.33 mg/dL — ABNORMAL HIGH (ref 0.44–1.00)
GFR, Estimated: 16 mL/min — ABNORMAL LOW (ref >60)
Glucose, Bld: 177 mg/dL — ABNORMAL HIGH (ref 70–99)
Potassium: 4.8 mmol/L (ref 3.5–5.1)
Sodium: 134 mmol/L — ABNORMAL LOW (ref 135–145)

## 2022-11-23 LAB — PHOSPHORUS: Phosphorus: 4.5 mg/dL (ref 2.5–4.6)

## 2022-11-23 LAB — MAGNESIUM: Magnesium: 1.8 mg/dL (ref 1.7–2.4)

## 2022-11-23 MED ORDER — CARVEDILOL 25 MG PO TABS: 25.0000 mg | ORAL_TABLET | Freq: Two times a day (BID) | ORAL | 3 refills | Status: DC

## 2022-11-23 MED ORDER — CLONIDINE HCL 0.1 MG PO TABS
0.1000 mg | ORAL_TABLET | Freq: Once | ORAL | Status: AC
  Administered 2022-11-23: 0.1 mg via ORAL

## 2022-11-23 MED ORDER — AMLODIPINE BESYLATE 5 MG PO TABS: 5.0000 mg | ORAL_TABLET | Freq: Every day | ORAL | 3 refills | Status: DC

## 2022-11-23 NOTE — Patient Instructions (Addendum)
Medication Instructions:  Your physician recommends the following medication changes.  START TAKING: Amlodipine 5 mg by mouth daily   *If you need a refill on your cardiac medications before your next appointment, please call your pharmacy*   Lab Work: None ordered today   Testing/Procedures: None ordered today   Follow-Up: At [facility], you and your health needs are our priority.  As part of our continuing mission to provide you with exceptional heart care, we have created designated Provider Care Teams.  These Care Teams include your primary Cardiologist (physician) and Advanced Practice Providers (APPs -  Physician Assistants and Nurse Practitioners) who all work together to provide you with the care you need, when you need it.  We recommend signing up for the patient portal called "MyChart".  Sign up information is provided on this After Visit Summary.  MyChart is used to connect with patients for Virtual Visits (Telemedicine).  Patients are able to view lab/test results, encounter notes, upcoming appointments, etc.  Non-urgent messages can be sent to your provider as well.   To learn more about what you can do with MyChart, go to [email or web address].    Your next appointment:   2 week(s)  Provider:   You will see one of the following Advanced Practice Providers on your designated Care Team:   Murray Hodgkins, NP Christell Faith, PA-C Cadence Kathlen Mody, PA-C Gerrie Nordmann, NP

## 2022-11-24 ENCOUNTER — Telehealth: Payer: Self-pay | Admitting: Internal Medicine

## 2022-11-24 NOTE — Telephone Encounter (Signed)
Prior to calling the patient, I clarified with Dr. Saunders Revel, if the patient's SBP was > 120 she could take amlodipine 2.5 mg tonight.  I called and spoke with the patient regarding Dr. Darnelle Bos recommendations to decrease amlodipine to 2.5 mg once daily and continue coreg 25 mg BID.  I advised her to only take the amlodipine 2.5 mg dose tonight if her SBP was >120.   After speaking with this patient about the 2.5 mg Amlodipine, she stated she was supposed to get a call from our office last night as to what exactly she was supposed to take for amlodipine after Dr. Saunders Revel spoke with [facility].   She said she never heard back and took her husband's amlodipine 10 mg last night- she failed to tell me this earlier- she hadn't even picked up the 5 mg tablets.  The patient then became very concerned about her DBP readings and why when her blood pressure read 113/58 was she more dizzy than her initial reading of 89/60. I advised the patient that by the time she had the reading 113/58, she had taken her coreg dose.  I have advised her that her BP medication adjustments at this time are being based on her SBP readings.  I have advised her to: - check her BP ~ 5 pm today - If SBP > 120, she will take amlodipine 5 mg - 0.5 tablet (2.5 mg) tonight - continue coreg 25 mg BID over the weekend with her amlodipine 2.5 mg once daily - call on Monday with BP readings.   The patient voices understanding and is agreeable.  Dr. Saunders Revel made aware that the patient confirmed taking amlodipine 10 mg x 1 dose last night . Confirmed with Dr. Darnelle Bos nurse, Lars Mage, that she did speak with the patient last night regarding the amlodipine 5 mg once daily dose and that the RX was sent to the pharmacy.  He asked that we call back to see what her blood sugars are running at this time to insure she is not hypoglycemic and if this could be impairing her though processes.

## 2022-11-24 NOTE — Telephone Encounter (Signed)
Pt c/o medication issue:  1. Name of Medication:   amLODipine (NORVASC) 5 MG tablet   2. How are you currently taking this medication (dosage and times per day)?  Patient has been off this medication  3. Are you having a reaction (difficulty breathing--STAT)?   4. What is your medication issue?   Patient stated she recently started back on this medication and today her BP was 89/60 HR 62 before she took her carvedilol (COREG) 25 MG tablet and she has been very dizzy.  After she took the [place] her BP was 113/53 and she had not taken the amlodipine.  Patient would like to know if there is a sliding scale she could apply for better control of symptoms.

## 2022-11-24 NOTE — Telephone Encounter (Signed)
Called pt to check blood sugar as requested by MD. Pt stated blood sugar at 2:30 pm 98 and currently 156 (after meal). BP 148/71 MD made aware.  Nurse reiterated to start amlodipine 2.5 mg daily if systolic greater than 757. Pt verbalized understanding.

## 2022-11-24 NOTE — Telephone Encounter (Signed)
I spoke with the patient. She was seen in the office yesterday with Dr. Saunders Revel and advised her SBP was >200.  She was given clonidine 0.1 mg x 1 dose and BP improved to 172/70. Per Maudie Mercury, Dr. Saunders Revel spoke with her transplant team and the decision was made to restart amlodipine 5 mg once daily.  Kim advised she took her 1st dose of amlodipine yesterday evening around 5-6 pm.  She also took her regular dose of coreg 25 mg last night.  Upon waking this morning, her BP was 89/60 (62). She advised she "didn't really think about it" and took her coreg 25 mg AM dose with her regular AM meds today as well. BP is currently 113/53.  The patient is having some dizziness and nausea.  She is wanting to know about being on a "sliding scale" with her BP meds so she knows when she should take these based on her BP.  She confirms home BP prior to yesterdays visit were: 140-160/80.  I have advised the patient: - I am unsure if she needs a sliding scale for meds once she can get past her low readings from today (due to clonidine in office yesterday & amlodipine start last night), we may have a better idea starting tomorrow how she will respond to Coreg 25 mg BID & Amlodipine 5 mg QD - to increase her water intake some today to support her low BP - lay with her lower extremities elevated above her heart when able - hold off on taking amlodipine today until we can review with Dr. Saunders Revel  She is aware we will reach out to her once further MD recommendations are received.  The patient voices understanding and is agreeable.

## 2022-11-24 NOTE — Telephone Encounter (Signed)
I recommend Ms. Pakula decrease amlodipine to 2.5 mg daily and continue current dose of carvedilol.  Nelva Bush, MD

## 2022-11-25 LAB — TACROLIMUS LEVEL: Tacrolimus (FK506) - LabCorp: 5.4 ng/mL (ref 2.0–20.0)

## 2022-12-07 NOTE — Progress Notes (Signed)
Cardiology Office Note:    Date:  12/08/2022   ID:  Brandi Carroll, DOB 03-Dec-1965, MRN [id number]  PCP:  Derinda Late, MD   [place] [facility] Cardiologist:  Nelva Bush, MD     Referring MD: Derinda Late, MD   Chief Complaint  Patient presents with   2 week follow up     Seen for Dr. Saunders Revel    History of Present Illness:    Brandi Carroll is a 57 y.o. female with a hx of hypertension, hyperlipidemia, stroke, DM 1, CKD status post renal transplant, GERD, OSA (wears CPAP), and Bell's palsy.  She initially establish care with our office in 2022 for evaluation of chest pain and left arm pain.  During this first visit she became unresponsive due to a hypoglycemic event and was subsequently sent to the ED for further evaluation and treatment.  Echo in 2023 showed an EF of 60 to 26%, grade 1 diastolic dysfunction, mild TR, mild MR.  She continued to have labile blood pressure readings.  Last seen in the office on 11/23/2022 by Dr. Saunders Revel, at that time her blood pressure was significantly elevated, she was having intermittent dizziness as well.  She was having further declining kidney function and was in the process of restarting pretransplant evaluation in case she were to need a second kidney transplant.  Dr. Saunders Revel discussed with Dr. Suzan Nailer at [facility] and the decision was made to proceed with starting her on amlodipine 5 mg in addition to her carvedilol 25 mg twice per day.  The following day, she called the office with concerns that her blood pressure was too low 86/60 and she felt dizzy.  She had inadvertently taken her husband's amlodipine 10 mg and was not sure if she should continue to take the medication.  It was reiterated to her to take lower dose of amlodipine daily.  She presents today for follow up of her blood pressure. She took the amlodipine some days 5 mg, some days 2.5 mg, however she ultimately discontinued it on 12/01/22 for pedal edema and weight gain. Today,  her BP is 180/74, rechecked 170/70. She continues to monitor her BP twice/day, typical am readings are 333-545 [id number]/73-42 diastolic--she checks her BP immediately after she takes her am coreg. Her evening BP readings around 7 pm are typically 170's/80's. Since she stopped her amlodipine, her pedal edema has mostly resolved and her weight is back to her baseline of  "143 at home". She denies chest pain, palpitations, dyspnea, pnd, HA, blurred vision, nausea, orthopnea, n, v, dizziness, syncope, weight gain, or early satiety.   Past Medical History:  Diagnosis Date   Chronic sinusitis    CKD (chronic kidney disease), stage IV ([place])    a. s/p renal transplant.   Diabetes mellitus without complication (HCC)    A7G, 7.0 last check; type 1, has insulin pump; subsequently has neuropathy;    Diabetic neuropathy (HCC)    Diastolic dysfunction    a. 04/2022 Echo: EF 60-65%, no rwma, GrI DD, nl RV fxn, RVSP 42.33mHg, mild MR, mild-mod TR.   GERD (gastroesophageal reflux disease)    Hyperlipemia    Hyperthyroidism    Insulin pump in place    Labile Hypertension    Nausea and vomiting    Obesity    Osteoporosis    Stroke ([facility] [place] [facility]    Tremor     Past Surgical History:  Procedure Laterality Date   CATARACT EXTRACTION     COLONOSCOPY WITH PROPOFOL  N/A 10/19/2016   Procedure: COLONOSCOPY WITH PROPOFOL;  Surgeon: Lollie Sails, MD;  Location: [facility] ENDOSCOPY;  Service: Endoscopy;  Laterality: N/A;   EYE SURGERY     bilateral (cataracts)   FOOT SURGERY     FOOT SURGERY     KIDNEY TRANSPLANT  2007    Current Medications: Current Meds  Medication Sig   aspirin 81 MG tablet Take 81 mg by mouth daily.   Biotin 1 MG CAPS Take by mouth daily.   carvedilol (COREG) 25 MG tablet Take 1 tablet (25 mg total) by mouth 2 (two) times daily.   Cholecalciferol (VITAMIN D3) 2000 UNITS CHEW Chew by mouth daily.   cyanocobalamin 1000 MCG tablet Take 1,000 mcg by mouth daily.   fexofenadine (ALLEGRA) 180 MG  tablet Take 180 mg by mouth daily.   fluocinolone (SYNALAR) 0.01 % external solution Apply topically as needed.   fluticasone (FLONASE) 50 MCG/ACT nasal spray Place 2 sprays into both nostrils daily.   Glucagon (BAQSIMI ONE PACK) 3 MG/DOSE POWD 1 spray in one nostril in case of severe hypoglycemia   glucagon 1 MG injection Inject 1 mg into the vein once as needed.   hydrALAZINE (APRESOLINE) 25 MG tablet Take 1 tablet (25 mg total) by mouth 3 (three) times daily.   insulin aspart (NOVOLOG) 100 UNIT/ML injection USE UP TO 65 UNITS DAILY IN PUMP.   LUMIGAN 0.01 % SOLN Place 1 drop into the right eye at bedtime.   montelukast (SINGULAIR) 10 MG tablet Take 10 mg by mouth at bedtime.   mycophenolate (CELLCEPT) 250 MG capsule Take by mouth 2 (two) times daily.   Olopatadine-Mometasone (RYALTRIS) [id number] MCG/ACT SUSP Place 2 sprays into the nose daily.   rosuvastatin (CRESTOR) 5 MG tablet Take 5 mg by mouth daily.   tacrolimus (PROGRAF) 1 MG capsule Take 5 mg by mouth daily.     Allergies:   Erythromycin, Floxin [ofloxacin], Latex, Levaquin [levofloxacin in d5w], Moxifloxacin, Sulfa antibiotics, and Tape   Social History   Socioeconomic History   Marital status: Married    Spouse name: Not on file   Number of children: Not on file   Years of education: Not on file   Highest education level: Not on file  Occupational History   Not on file  Tobacco Use   Smoking status: Never   Smokeless tobacco: Never  Vaping Use   Vaping Use: Never used  Substance and Sexual Activity   Alcohol use: No   Drug use: No   Sexual activity: Not on file  Other Topics Concern   Not on file  Social History Narrative   Not on file   Social Determinants of Health   Financial Resource Strain: Not on file  Food Insecurity: Not on file  Transportation Needs: Not on file  Physical Activity: Not on file  Stress: Not on file  Social Connections: Not on file     Family History: The patient's family history  includes Breast cancer in her maternal aunt; Parkinson's disease in her mother.  ROS:   Please see the history of present illness.     All other systems reviewed and are negative.  EKGs/Labs/Other Studies Reviewed:    The following studies were reviewed today:  04/14/22 echo complete - EF 60-65%, no RWMA, grade I DD, mildly elevated PA systolic pressure (23.7 mmHg), mild MR, TR mild > moderate.    EKG:  EKG is ordered today. NSR, HR 69 bpm, consistent with previous EKG tracings.  Recent Labs: 11/23/2022: BUN 70; Creatinine, Ser 3.33; Hemoglobin 10.0; Magnesium 1.8; Platelets 165; Potassium 4.8; Sodium 134, gfr 16  Recent Lipid Panel    Component Value Date/Time   CHOL 160 10/19/2018 0831   CHOL 145 01/30/2015 0754   TRIG 43 10/19/2018 0831   TRIG 65 01/30/2015 0754   HDL 74 10/19/2018 0831   HDL 57 01/30/2015 0754   CHOLHDL 2.2 10/19/2018 0831   VLDL 9 10/19/2018 0831   VLDL 13 01/30/2015 0754   LDLCALC 77 10/19/2018 0831   LDLCALC 75 01/30/2015 0754   LDLDIRECT 74 07/13/2018 0828     Risk Assessment/Calculations:      HYPERTENSION CONTROL Vitals:   12/08/22 0839 12/08/22 0923  BP: (!) 180/74 (!) 170/70    The patient's blood pressure is elevated above target today.  In order to address the patient's elevated BP: A current anti-hypertensive medication was adjusted today.; The blood pressure is usually elevated in clinic.  Blood pressures monitored at home have been optimal.            Physical Exam:    VS:  BP (!) 170/70   Pulse 69   Ht '5\' 3"'$  (1.6 m)   Wt 152 lb 4 oz (69.1 kg)   SpO2 99%   BMI 26.97 kg/m     Wt Readings from Last 3 Encounters:  12/08/22 152 lb 4 oz (69.1 kg)  11/23/22 150 lb (68 kg)  08/22/22 159 lb 6.4 oz (72.3 kg)     GEN:  Well nourished, well developed in no acute distress HEENT: Normal NECK: No JVD; No carotid bruits LYMPHATICS: No lymphadenopathy CARDIAC: RRR, no murmurs, rubs, gallops RESPIRATORY:  Clear to auscultation  without rales, wheezing or rhonchi  ABDOMEN: Soft, non-tender, non-distended MUSCULOSKELETAL:  +1 edema mid tibia; No deformity  SKIN: Warm and dry NEUROLOGIC:  Alert and oriented x 3 PSYCHIATRIC:  Normal affect   ASSESSMENT:    1. Essential hypertension   2. CKD (chronic kidney disease), stage IV ([place])   3. History of kidney transplant   4. Type 1 diabetes mellitus with complications (HCC)   5. Mixed hyperlipidemia    PLAN:    In order of problems listed above:  Hypertension - BP today remains elevated, reviewed readings at home and they are elevated as well. She is unable to tolerate the amlodipine secondary to pedal edema and weight gain. Continue coreg 25 mg twice daily. Start hydralazine 25 mg three times/day. CKD - s/p renal transplant. Cr elevated 3.33, she is beginning the process of getting back on the transplant list. She follows with [facility] transplant team. Continue to avoid nephrotoxic agents. DM1 with complications - BS this am 112, she is trialing a subcutaneous CGM, but it is not working and she plans to go back to checking capillary blood glucose. DM managed by endocrinology.  HLD - last checked ~ 2021. Chest FLP and LFT. Continue rosuvastatin 5 mg daily.   Disposition - start hydralazine 25 mg three times/day, check LFT and fasting lipid panel, return in 2 weeks for BP follow up           Medication Adjustments/Labs and Tests Ordered: Current medicines are reviewed at length with the patient today.  Concerns regarding medicines are outlined above.  Orders Placed This Encounter  Procedures   Hepatic function panel   Lipid panel   EKG 12-Lead   Meds ordered this encounter  Medications   hydrALAZINE (APRESOLINE) 25 MG tablet    Sig: Take  1 tablet (25 mg total) by mouth 3 (three) times daily.    Dispense:  270 tablet    Refill:  3    Patient Instructions  Medication Instructions:  Your physician has recommended you make the following change in your  medication:   START Hydralazine 25 mg three times a day STOP Amlodipine (Norvasc)   *If you need a refill on your cardiac medications before your next appointment, please call your pharmacy*   Lab Work: Lipid & Liver labs today over at the [facility] and stop at registration desk for check in.   If you have labs (blood work) drawn today and your tests are completely normal, you will receive your results only by: [place] (if you have MyChart) OR A paper copy in the mail If you have any lab test that is abnormal or we need to change your treatment, we will call you to review the results.   Testing/Procedures: None   Follow-Up: At [facility], you and your health needs are our priority.  As part of our continuing mission to provide you with exceptional heart care, we have created designated Provider Care Teams.  These Care Teams include your primary Cardiologist (physician) and Advanced Practice Providers (APPs -  Physician Assistants and Nurse Practitioners) who all work together to provide you with the care you need, when you need it.   Your next appointment:   2 week(s)  Provider:   Nelva Bush, MD or Christell Faith, PA-C       Signed, Trudi Ida, NP  12/08/2022 9:47 AM    [place]

## 2022-12-08 ENCOUNTER — Encounter: Payer: Self-pay | Admitting: Physician Assistant

## 2022-12-08 ENCOUNTER — Ambulatory Visit: Payer: BC Managed Care – PPO | Attending: Physician Assistant | Admitting: Cardiology

## 2022-12-08 VITALS — BP 170/70 | HR 69 | Ht 63.0 in | Wt 152.2 lb

## 2022-12-08 DIAGNOSIS — N184 Chronic kidney disease, stage 4 (severe): Secondary | ICD-10-CM

## 2022-12-08 DIAGNOSIS — E108 Type 1 diabetes mellitus with unspecified complications: Secondary | ICD-10-CM | POA: Diagnosis not present

## 2022-12-08 DIAGNOSIS — I10 Essential (primary) hypertension: Secondary | ICD-10-CM

## 2022-12-08 DIAGNOSIS — Z940 Kidney transplant status: Secondary | ICD-10-CM

## 2022-12-08 DIAGNOSIS — E782 Mixed hyperlipidemia: Secondary | ICD-10-CM

## 2022-12-08 MED ORDER — HYDRALAZINE HCL 25 MG PO TABS: 25.0000 mg | ORAL_TABLET | Freq: Three times a day (TID) | ORAL | 3 refills | Status: DC

## 2022-12-08 NOTE — Patient Instructions (Addendum)
Medication Instructions:  Your physician has recommended you make the following change in your medication:   START Hydralazine 25 mg three times a day STOP Amlodipine (Norvasc)   *If you need a refill on your cardiac medications before your next appointment, please call your pharmacy*   Lab Work: Lipid & Liver labs today over at the [facility] and stop at registration desk for check in.   If you have labs (blood work) drawn today and your tests are completely normal, you will receive your results only by: [place] (if you have MyChart) OR A paper copy in the mail If you have any lab test that is abnormal or we need to change your treatment, we will call you to review the results.   Testing/Procedures: None   Follow-Up: At [facility], you and your health needs are our priority.  As part of our continuing mission to provide you with exceptional heart care, we have created designated Provider Care Teams.  These Care Teams include your primary Cardiologist (physician) and Advanced Practice Providers (APPs -  Physician Assistants and Nurse Practitioners) who all work together to provide you with the care you need, when you need it.   Your next appointment:   2 week(s)  Provider:   Nelva Bush, MD or Christell Faith, PA-C

## 2022-12-21 ENCOUNTER — Telehealth: Payer: Self-pay | Admitting: *Deleted

## 2022-12-21 NOTE — Telephone Encounter (Signed)
-----   Message from Britt [place], [place] sent at 12/18/2022  9:10 AM EST ----- Pt pending Hepatic/lipid. Pt f/u with [facility] 2/19 please advise if ok to keep future appointment.

## 2022-12-21 NOTE — Telephone Encounter (Signed)
Left voicemail message that provider would like some fasting labs prior to her appointment with instructions to call back if any questions.

## 2022-12-25 ENCOUNTER — Ambulatory Visit: Payer: BC Managed Care – PPO | Admitting: Physician Assistant

## 2022-12-29 ENCOUNTER — Other Ambulatory Visit: Payer: Self-pay

## 2022-12-29 MED ORDER — CARVEDILOL 25 MG PO TABS: 25.0000 mg | ORAL_TABLET | Freq: Two times a day (BID) | ORAL | 3 refills | Status: DC

## 2023-01-29 ENCOUNTER — Ambulatory Visit: Payer: BC Managed Care – PPO | Admitting: Physician Assistant

## 2023-01-29 DIAGNOSIS — E108 Type 1 diabetes mellitus with unspecified complications: Secondary | ICD-10-CM

## 2023-01-29 DIAGNOSIS — E782 Mixed hyperlipidemia: Secondary | ICD-10-CM

## 2023-01-29 DIAGNOSIS — N186 End stage renal disease: Secondary | ICD-10-CM

## 2023-01-29 DIAGNOSIS — Z940 Kidney transplant status: Secondary | ICD-10-CM

## 2023-01-29 DIAGNOSIS — R0989 Other specified symptoms and signs involving the circulatory and respiratory systems: Secondary | ICD-10-CM

## 2023-02-08 ENCOUNTER — Other Ambulatory Visit: Payer: Self-pay | Admitting: Physician Assistant

## 2023-02-12 ENCOUNTER — Telehealth: Payer: Self-pay | Admitting: Internal Medicine

## 2023-02-12 MED ORDER — CARVEDILOL 25 MG PO TABS: 25.0000 mg | ORAL_TABLET | Freq: Two times a day (BID) | ORAL | 3 refills | Status: DC

## 2023-02-12 NOTE — Telephone Encounter (Signed)
Refill for carvedilol 25 mg BID sent to requested pharmacy Pt made aware

## 2023-02-12 NOTE — Telephone Encounter (Signed)
Pt c/o medication issue:  1. Name of Medication: carvedilol (COREG) 25 MG tablet   2. How are you currently taking this medication (dosage and times per day)? 2 tablets of 12.5 mg twice a day  3. Are you having a reaction (difficulty breathing--STAT)? No   4. What is your medication issue? Patient is calling stating this prescription has been requested twice, but keeps getting denied by our office. She reports the pharmacy still has the old prescription of 12.5 mg's instead of 25 mg. Due to this she has been taking two tablets twice daily which has caused her to run out of medication. She is requesting the 25 MG tablets be sent in ASAP and she be called back when prescription is sent to the pharmacy due to the previous issue of it being denied. Please advise.

## 2023-02-24 NOTE — Progress Notes (Unsigned)
Cardiology Office Note    Date:  02/27/2023   ID:  Easter, Kennebrew 1966/01/16, MRN [id number]  PCP:  Kandyce Rud, MD  Cardiologist:  Yvonne Kendall, MD  Electrophysiologist:  None   Chief Complaint: Follow-up  History of Present Illness:   Brandi Carroll is a 57 y.o. female with history of labile HTN with orthostatic hypotension, HLD, stroke with imbalance, brittle type 1 diabetes mellitus, CKD s/p renal transplant, Bell's palsy, OSA, memory loss, tremor, and GERD who presents for follow-up of HTN.  She established care in our office in 06/2021 for evaluation of chest and left arm pain.  However, that visit was unable to be completed because she became unresponsive in the office and was found to be severely hypoglycemic in the setting of having not eaten much and continued her basal insulin pump infusion.  She was given oral and IV glucose with prompt improvement.  She was taken to the ED for further evaluation, though left without being seen by a provider.  Subsequent echo in 04/2022 demonstrated an EF of 60 to 65%, no regional wall motion abnormalities, grade 1 diastolic dysfunction, normal RV systolic function and ventricular cavity size, mildly elevated PASP estimated at 42.7 mmHg, mild mitral regurgitation, mild to moderate tricuspid regurgitation, and an estimated right atrial pressure of 8 mmHg.  She was admitted to [facility] in 06/2022 with DKA and hypotension prompting discontinuation of antihypertensive regimen.  In this setting, she was noting labile BP readings thereafter leading to medication adjustments.  She was most recently seen in the office in 12/2022 for follow-up of her blood pressure and reported she had self discontinued amlodipine due to pedal edema and weight gain with noted improvement in edema and weight back to baseline following discontinuation of amlodipine.  In the office, blood pressure was elevated at 180/74 with recheck of 170/70.  She reported her home  blood pressure readings were ranging from 144-156/70-80.  She was started on hydralazine 25 mg 3 times daily with continuation of carvedilol 25 mg twice daily.  She is without symptoms of angina or cardiac decompensation.  She does continue to note intermittent dizziness, particularly with positional changes including bending over to pick something up and standing up.  She has not fallen or passed out.  Home BP readings earlier today were in the 120s to 130s over 70s.  When comparing to our BP cuff in the office, her readings are about 12 points to lower on the systolic and about 10 points lower on diastolic readings.  She indicates she has been placed back on the renal transplant list given decline in kidney function.  She does typically wear knee-high compression socks.  She would like to begin an exercise regimen, though was concerned regarding her underlying dizziness.   Labs independently reviewed: 01/2023 - Hgb 8.9, PLT 172, A1c 7.6, TC 163, TG 71, HDL 59, LDL 90, albumin 3.9, AST/ALT not elevated, BUN 70, serum creatinine 3.92 11/2022 - magnesium 1.8, potassium 4.8  Past Medical History:  Diagnosis Date   Chronic sinusitis    CKD (chronic kidney disease), stage IV    a. s/p renal transplant.   Diabetes mellitus without complication    A1c, 7.0 last check; type 1, has insulin pump; subsequently has neuropathy;    Diabetic neuropathy    Diastolic dysfunction    a. 04/2022 Echo: EF 60-65%, no rwma, GrI DD, nl RV fxn, RVSP 42.21mmHg, mild MR, mild-mod TR.   GERD (gastroesophageal reflux disease)  Hyperlipemia    Hyperthyroidism    Insulin pump in place    Labile Hypertension    Nausea and vomiting    Obesity    Osteoporosis    Stroke    Tremor     Past Surgical History:  Procedure Laterality Date   CATARACT EXTRACTION     COLONOSCOPY WITH PROPOFOL N/A 10/19/2016   Procedure: COLONOSCOPY WITH PROPOFOL;  Surgeon: Christena Deem, MD;  Location: [facility] ENDOSCOPY;  Service: Endoscopy;   Laterality: N/A;   EYE SURGERY     bilateral (cataracts)   FOOT SURGERY     FOOT SURGERY     KIDNEY TRANSPLANT  2007    Current Medications: Current Meds  Medication Sig   aspirin 81 MG tablet Take 81 mg by mouth daily.   Biotin 1 MG CAPS Take by mouth daily.   carvedilol (COREG) 25 MG tablet Take 1 tablet (25 mg total) by mouth 2 (two) times daily.   Cholecalciferol (VITAMIN D3) 2000 UNITS CHEW Chew by mouth daily.   cyanocobalamin 1000 MCG tablet Take 1,000 mcg by mouth daily.   fexofenadine (ALLEGRA) 180 MG tablet Take 180 mg by mouth daily.   fluocinolone (SYNALAR) 0.01 % external solution Apply topically as needed.   fluticasone (FLONASE) 50 MCG/ACT nasal spray Place 2 sprays into both nostrils daily.   Glucagon (BAQSIMI ONE PACK) 3 MG/DOSE POWD 1 spray in one nostril in case of severe hypoglycemia   glucagon 1 MG injection Inject 1 mg into the vein once as needed.   insulin aspart (NOVOLOG) 100 UNIT/ML injection USE UP TO 65 UNITS DAILY IN PUMP.   LUMIGAN 0.01 % SOLN Place 1 drop into the right eye at bedtime.   montelukast (SINGULAIR) 10 MG tablet Take 10 mg by mouth at bedtime.   mycophenolate (CELLCEPT) 250 MG capsule Take by mouth 2 (two) times daily.   rosuvastatin (CRESTOR) 5 MG tablet Take 5 mg by mouth daily.   tacrolimus (PROGRAF) 1 MG capsule Take 5 mg by mouth daily.   valACYclovir (VALTREX) 1000 MG tablet Take 1,000 mg by mouth as needed.    Allergies:   Erythromycin, Floxin [ofloxacin], Latex, Levaquin [levofloxacin in d5w], Moxifloxacin, Sulfa antibiotics, and Tape   Social History   Socioeconomic History   Marital status: Married    Spouse name: Not on file   Number of children: Not on file   Years of education: Not on file   Highest education level: Not on file  Occupational History   Not on file  Tobacco Use   Smoking status: Never   Smokeless tobacco: Never  Vaping Use   Vaping Use: Never used  Substance and Sexual Activity   Alcohol use: No    Drug use: No   Sexual activity: Not on file  Other Topics Concern   Not on file  Social History Narrative   Not on file   Social Determinants of Health   Financial Resource Strain: Not on file  Food Insecurity: Not on file  Transportation Needs: Not on file  Physical Activity: Not on file  Stress: Not on file  Social Connections: Not on file     Family History:  The patient's family history includes Breast cancer in her maternal aunt; Parkinson's disease in her mother.  ROS:   12-point review of systems is negative unless otherwise noted in the HPI.   EKGs/Labs/Other Studies Reviewed:    Studies reviewed were summarized above. The additional studies were reviewed today:  2D  echo 04/14/2022: 1. Left ventricular ejection fraction, by estimation, is 60 to 65%. The  left ventricle has normal function. The left ventricle has no regional  wall motion abnormalities. Left ventricular diastolic parameters are  consistent with Grade I diastolic  dysfunction (impaired relaxation). The average left ventricular global  longitudinal strain is -16.5 %.   2. Right ventricular systolic function is normal. The right ventricular  size is normal. There is mildly elevated pulmonary artery systolic  pressure. The estimated right ventricular systolic pressure is 42.7 mmHg.   3. The mitral valve is normal in structure. Mild mitral valve  regurgitation. No evidence of mitral stenosis.   4. Tricuspid valve regurgitation is mild to moderate.   5. The aortic valve is tricuspid. Aortic valve regurgitation is not  visualized. No aortic stenosis is present.   6. The inferior vena cava is normal in size with <50% respiratory  variability, suggesting right atrial pressure of 8 mmHg.   EKG:  EKG is not ordered today.    Recent Labs: 11/23/2022: BUN 70; Creatinine, Ser 3.33; Hemoglobin 10.0; Magnesium 1.8; Platelets 165; Potassium 4.8; Sodium 134  Recent Lipid Panel    Component Value Date/Time    CHOL 160 10/19/2018 0831   CHOL 145 01/30/2015 0754   TRIG 43 10/19/2018 0831   TRIG 65 01/30/2015 0754   HDL 74 10/19/2018 0831   HDL 57 01/30/2015 0754   CHOLHDL 2.2 10/19/2018 0831   VLDL 9 10/19/2018 0831   VLDL 13 01/30/2015 0754   LDLCALC 77 10/19/2018 0831   LDLCALC 75 01/30/2015 0754   LDLDIRECT 74 07/13/2018 0828    PHYSICAL EXAM:    VS:  BP (!) 156/72   Pulse 67   Ht 5\' 4"  (1.626 m)   Wt 146 lb 9.6 oz (66.5 kg)   SpO2 98%   BMI 25.16 kg/m   BMI: Body mass index is 25.16 kg/m.  Physical Exam Vitals reviewed.  Constitutional:      Appearance: She is well-developed.  HENT:     Head: Normocephalic and atraumatic.  Eyes:     General:        Right eye: No discharge.        Left eye: No discharge.  Neck:     Vascular: No JVD.  Cardiovascular:     Rate and Rhythm: Normal rate and regular rhythm.     Heart sounds: Normal heart sounds, S1 normal and S2 normal. Heart sounds not distant. No midsystolic click and no opening snap. No murmur heard.    No friction rub.  Pulmonary:     Effort: Pulmonary effort is normal. No respiratory distress.     Breath sounds: Normal breath sounds. No decreased breath sounds, wheezing or rales.  Chest:     Chest wall: No tenderness.  Abdominal:     General: There is no distension.  Musculoskeletal:     Cervical back: Normal range of motion.     Right lower leg: No edema.     Left lower leg: No edema.  Skin:    General: Skin is warm and dry.     Nails: There is no clubbing.  Neurological:     Mental Status: She is alert and oriented to person, place, and time.  Psychiatric:        Speech: Speech normal.        Behavior: Behavior normal.        Thought Content: Thought content normal.        Judgment:  Judgment normal.     Wt Readings from Last 3 Encounters:  02/27/23 146 lb 9.6 oz (66.5 kg)  12/08/22 152 lb 4 oz (69.1 kg)  11/23/22 150 lb (68 kg)     Orthostatic VS for the past 24 hrs (Last 3 readings):  BP- Lying  Pulse- Lying BP- Sitting Pulse- Sitting BP- Standing at 0 minutes Pulse- Standing at 0 minutes BP- Standing at 3 minutes Pulse- Standing at 3 minutes  02/27/23 1544 180/73 70 168/72 71 156/72 69 157/70 69     ASSESSMENT & PLAN:   Labile hypertension with orthostatic hypotension and chronic dizziness: Blood pressure is elevated at triage with positive orthostatics noted in the office.  BP at home reported in the 120s to 130s, though this is approximately 12 points lower than our cuff upon comparison.  Overall, management of her hypertension is somewhat difficult given labile readings and in the context of underlying renal dysfunction.  She discontinued amlodipine secondary to pedal edema which has subsequently resolved.  With noted orthostasis we will have the patient undergo a trial of thigh-high compression pants with continuation of carvedilol 25 mg twice daily.  She will take hydralazine 25 mg 3 times daily as needed for systolic blood pressure greater than 150 mmHg.  We may need to schedule hydralazine.  I suspect her chronic dizziness is multifactorial including history of stroke, labile hypertension with orthostasis, and brittle diabetes.  Exercise regiment will be important.  CKD s/p renal transplant: Renal function continues to decline.  Avoid nephrotoxic agents.  Transplant service at [facility] has placed her back on transplant list.  Continued blood pressure control will be important.  Type 1 diabetes mellitus: Followed by endocrinology.    Disposition: F/u with Dr. Okey Dupre or an APP in 2 months.   Medication Adjustments/Labs and Tests Ordered: Current medicines are reviewed at length with the patient today.  Concerns regarding medicines are outlined above. Medication changes, Labs and Tests ordered today are summarized above and listed in the Patient Instructions accessible in Encounters.   Signed, Eula Listen, PA-C 02/27/2023 4:27 PM     [facility] [place] [address] [place], [place] [address] [phone]

## 2023-02-27 ENCOUNTER — Ambulatory Visit: Payer: BC Managed Care – PPO | Attending: Physician Assistant | Admitting: Physician Assistant

## 2023-02-27 ENCOUNTER — Encounter: Payer: Self-pay | Admitting: Physician Assistant

## 2023-02-27 VITALS — BP 156/72 | HR 67 | Ht 64.0 in | Wt 146.6 lb

## 2023-02-27 DIAGNOSIS — E108 Type 1 diabetes mellitus with unspecified complications: Secondary | ICD-10-CM

## 2023-02-27 DIAGNOSIS — R42 Dizziness and giddiness: Secondary | ICD-10-CM

## 2023-02-27 DIAGNOSIS — R0989 Other specified symptoms and signs involving the circulatory and respiratory systems: Secondary | ICD-10-CM | POA: Diagnosis not present

## 2023-02-27 DIAGNOSIS — N184 Chronic kidney disease, stage 4 (severe): Secondary | ICD-10-CM

## 2023-02-27 DIAGNOSIS — Z940 Kidney transplant status: Secondary | ICD-10-CM

## 2023-02-27 DIAGNOSIS — I951 Orthostatic hypotension: Secondary | ICD-10-CM

## 2023-02-27 MED ORDER — HYDRALAZINE HCL 25 MG PO TABS: 25.0000 mg | ORAL_TABLET | Freq: Three times a day (TID) | ORAL | 3 refills | Status: DC | PRN

## 2023-02-27 NOTE — Patient Instructions (Addendum)
Please get some Compression pants that are thigh high.    Medication Instructions:  Your physician has recommended you make the following change in your medication:   TAKE Hydralazine 25 mg three times a day as needed for SBP (Top blood pressure number) greater than 150.   *If you need a refill on your cardiac medications before your next appointment, please call your pharmacy*   Lab Work: None  If you have labs (blood work) drawn today and your tests are completely normal, you will receive your results only by: MyChart Message (if you have MyChart) OR A paper copy in the mail If you have any lab test that is abnormal or we need to change your treatment, we will call you to review the results.   Testing/Procedures: None   Follow-Up: At [facility], you and your health needs are our priority.  As part of our continuing mission to provide you with exceptional heart care, we have created designated Provider Care Teams.  These Care Teams include your primary Cardiologist (physician) and Advanced Practice Providers (APPs -  Physician Assistants and Nurse Practitioners) who all work together to provide you with the care you need, when you need it.  Your next appointment:   2 month(s)  Provider:   Yvonne Kendall, MD or Eula Listen, PA-C

## 2023-03-22 ENCOUNTER — Other Ambulatory Visit: Payer: Self-pay | Admitting: Obstetrics and Gynecology

## 2023-03-22 DIAGNOSIS — Z1231 Encounter for screening mammogram for malignant neoplasm of breast: Secondary | ICD-10-CM

## 2023-04-11 ENCOUNTER — Ambulatory Visit
Admission: RE | Admit: 2023-04-11 | Discharge: 2023-04-11 | Disposition: A | Discharge status: Home / Self Care | Payer: BC Managed Care – PPO | Source: Ambulatory Visit | Attending: Obstetrics and Gynecology | Admitting: Obstetrics and Gynecology

## 2023-04-11 DIAGNOSIS — Z1231 Encounter for screening mammogram for malignant neoplasm of breast: Secondary | ICD-10-CM | POA: Insufficient documentation

## 2023-04-18 ENCOUNTER — Other Ambulatory Visit: Payer: Self-pay | Admitting: *Deleted

## 2023-04-18 MED ORDER — CARVEDILOL 25 MG PO TABS: 25.0000 mg | ORAL_TABLET | Freq: Two times a day (BID) | ORAL | 0 refills | Status: DC

## 2023-04-20 ENCOUNTER — Other Ambulatory Visit
Admission: RE | Admit: 2023-04-20 | Discharge: 2023-04-20 | Disposition: A | Discharge status: Home / Self Care | Payer: BC Managed Care – PPO | Attending: Nephrology | Admitting: Nephrology

## 2023-04-20 DIAGNOSIS — D899 Disorder involving the immune mechanism, unspecified: Secondary | ICD-10-CM | POA: Diagnosis present

## 2023-04-20 DIAGNOSIS — Z940 Kidney transplant status: Secondary | ICD-10-CM | POA: Insufficient documentation

## 2023-04-20 DIAGNOSIS — E559 Vitamin D deficiency, unspecified: Secondary | ICD-10-CM | POA: Diagnosis not present

## 2023-04-20 DIAGNOSIS — D631 Anemia in chronic kidney disease: Secondary | ICD-10-CM | POA: Diagnosis not present

## 2023-04-20 DIAGNOSIS — N189 Chronic kidney disease, unspecified: Secondary | ICD-10-CM | POA: Insufficient documentation

## 2023-04-20 DIAGNOSIS — Z09 Encounter for follow-up examination after completed treatment for conditions other than malignant neoplasm: Secondary | ICD-10-CM | POA: Diagnosis not present

## 2023-04-20 DIAGNOSIS — Z114 Encounter for screening for human immunodeficiency virus [HIV]: Secondary | ICD-10-CM | POA: Insufficient documentation

## 2023-04-20 DIAGNOSIS — E1129 Type 2 diabetes mellitus with other diabetic kidney complication: Secondary | ICD-10-CM | POA: Diagnosis not present

## 2023-04-20 DIAGNOSIS — Z9483 Pancreas transplant status: Secondary | ICD-10-CM | POA: Diagnosis not present

## 2023-04-20 DIAGNOSIS — B259 Cytomegaloviral disease, unspecified: Secondary | ICD-10-CM | POA: Diagnosis not present

## 2023-04-20 DIAGNOSIS — Z79899 Other long term (current) drug therapy: Secondary | ICD-10-CM | POA: Diagnosis not present

## 2023-04-20 DIAGNOSIS — N390 Urinary tract infection, site not specified: Secondary | ICD-10-CM | POA: Diagnosis not present

## 2023-04-20 DIAGNOSIS — Z789 Other specified health status: Secondary | ICD-10-CM | POA: Diagnosis not present

## 2023-04-20 LAB — CBC WITH DIFFERENTIAL/PLATELET
Abs Immature Granulocytes: 0.01 10*3/uL (ref 0.00–0.07)
Basophils Absolute: 0 10*3/uL (ref 0.0–0.1)
Basophils Relative: 0 %
Eosinophils Absolute: 0.1 10*3/uL (ref 0.0–0.5)
Eosinophils Relative: 4 %
HCT: 23.7 % — ABNORMAL LOW (ref 36.0–46.0)
Hemoglobin: 7.3 g/dL — ABNORMAL LOW (ref 12.0–15.0)
Immature Granulocytes: 0 %
Lymphocytes Relative: 29 %
Lymphs Abs: 1 10*3/uL (ref 0.7–4.0)
MCH: 26.4 pg (ref 26.0–34.0)
MCHC: 30.8 g/dL (ref 30.0–36.0)
MCV: 85.6 fL (ref 80.0–100.0)
Monocytes Absolute: 0.6 10*3/uL (ref 0.1–1.0)
Monocytes Relative: 17 %
Neutro Abs: 1.7 10*3/uL (ref 1.7–7.7)
Neutrophils Relative %: 50 %
Platelets: 181 10*3/uL (ref 150–400)
RBC: 2.77 MIL/uL — ABNORMAL LOW (ref 3.87–5.11)
RDW: 14.4 % (ref 11.5–15.5)
WBC: 3.4 10*3/uL — ABNORMAL LOW (ref 4.0–10.5)
nRBC: 0 % (ref 0.0–0.2)

## 2023-04-20 LAB — BASIC METABOLIC PANEL
Anion gap: 11 (ref 5–15)
BUN: 55 mg/dL — ABNORMAL HIGH (ref 6–20)
CO2: 23 mmol/L (ref 22–32)
Calcium: 8.5 mg/dL — ABNORMAL LOW (ref 8.9–10.3)
Chloride: 99 mmol/L (ref 98–111)
Creatinine, Ser: 3.84 mg/dL — ABNORMAL HIGH (ref 0.44–1.00)
GFR, Estimated: 13 mL/min — ABNORMAL LOW (ref >60)
Glucose, Bld: 89 mg/dL (ref 70–99)
Potassium: 4.7 mmol/L (ref 3.5–5.1)
Sodium: 133 mmol/L — ABNORMAL LOW (ref 135–145)

## 2023-04-20 LAB — PHOSPHORUS: Phosphorus: 4.8 mg/dL — ABNORMAL HIGH (ref 2.5–4.6)

## 2023-04-20 LAB — MAGNESIUM: Magnesium: 1.6 mg/dL — ABNORMAL LOW (ref 1.7–2.4)

## 2023-04-23 LAB — TACROLIMUS LEVEL: Tacrolimus (FK506) - LabCorp: 3.9 ng/mL (ref 2.0–20.0)

## 2023-04-27 NOTE — Progress Notes (Unsigned)
Cardiology Office Note    Date:  05/01/2023   ID:  Brandi, Carroll [birth year], MRN [id number]  PCP:  Brandi Rud, MD  Cardiologist:  Brandi Kendall, MD  Electrophysiologist:  None   Chief Complaint: Follow up  History of Present Illness:   Brandi Carroll is a 57 y.o. female with history of labile HTN with orthostatic hypotension, HLD, stroke with imbalance, brittle type 1 diabetes mellitus, CKD s/p renal transplant, anemia of chronic disease, Bell's palsy, OSA, memory loss, tremor, and GERD who presents for follow-up of HTN.   She established care in our office in 06/2021 for evaluation of chest and left arm pain.  However, that visit was unable to be completed because she became unresponsive in the office and was found to be severely hypoglycemic in the setting of having not eaten much and continued her basal insulin pump infusion.  She was given oral and IV glucose with prompt improvement.  She was taken to the ED for further evaluation, though left without being seen by a provider.  Subsequent echo in 04/2022 demonstrated an EF of 60 to 65%, no regional wall motion abnormalities, grade 1 diastolic dysfunction, normal RV systolic function and ventricular cavity size, mildly elevated PASP estimated at 42.7 mmHg, mild mitral regurgitation, mild to moderate tricuspid regurgitation, and an estimated right atrial pressure of 8 mmHg.  She was admitted to [facility] in 06/2022 with DKA and hypotension prompting discontinuation of antihypertensive regimen.  In this setting, she was noting labile BP readings thereafter leading to medication adjustments.  She was seen in the office in 12/2022 for follow-up of her blood pressure and reported she had self discontinued amlodipine due to pedal edema and weight gain with noted improvement in edema and weight back to baseline following discontinuation of amlodipine.  In the office, blood pressure was elevated at 180/74 with recheck of 170/70.  She  reported her home blood pressure readings were ranging from 144-156/70-80.  She was started on hydralazine 25 mg 3 times daily with continuation of carvedilol 25 mg twice daily.  She was last seen in the office in 02/2023 and remained without symptoms of angina or cardiac decompensation.  She continued to note intermittent dizziness, particularly with positional changes.  When comparing her BP cuff to ours, her home readings were about 12 points lower on the systolic and about 10 points lower on diastolic readings.  It was recommended that she wear thigh-high compression pants with continuation of carvedilol 25 mg twice daily and hydralazine 25 mg 3 times daily as needed for systolic blood pressure greater than 150 mmHg.  Earlier this month, received a MyChart message that she had inadvertently been taking 50 mg of carvedilol twice daily.  She also reported diarrhea.  She comes in doing well from a cardiac perspective and is without symptoms of angina or cardiac decompensation.  Dizziness appears to be more so related to environmental changes at this time including when going from a cold to warm environment and when entering bright light.  She has not noticed any change in dizziness with waist high compression pants.  She typically needs as needed hydralazine about 2 times per week.  Adherent and tolerating carvedilol 25 mg twice daily.  Diarrhea has improved on the lower dose carvedilol following inadvertent taking of 50 mg of twice daily.  No lower extremity swelling, abdominal distention, progressive orthopnea, or early satiety.  She is scheduled to begin repeat renal transplant evaluation through [facility] in 06/2023.  Labs independently reviewed: 04/2023 - Hgb 7.3, PLT 181, magnesium 1.6, potassium 4.7, BUN 55, SCr 3.84 01/2023 - A1c 7.6, TC 163, TG 71, HDL 59, LDL 90, albumin 3.9, AST/ALT not elevated  Past Medical History:  Diagnosis Date   Chronic sinusitis    CKD (chronic kidney disease), stage IV (HCC)     a. s/p renal transplant.   Diabetes mellitus without complication (HCC)    A1c, 7.0 last check; type 1, has insulin pump; subsequently has neuropathy;    Diabetic neuropathy (HCC)    Diastolic dysfunction    a. 04/2022 Echo: EF 60-65%, no rwma, GrI DD, nl RV fxn, RVSP 42.34mmHg, mild MR, mild-mod TR.   GERD (gastroesophageal reflux disease)    Hyperlipemia    Hyperthyroidism    Insulin pump in place    Labile Hypertension    Nausea and vomiting    Obesity    Osteoporosis    Stroke [facility])    Tremor     Past Surgical History:  Procedure Laterality Date   CATARACT EXTRACTION     COLONOSCOPY WITH PROPOFOL N/A 10/19/2016   Procedure: COLONOSCOPY WITH PROPOFOL;  Surgeon: Christena Deem, MD;  Location: [facility] ENDOSCOPY;  Service: Endoscopy;  Laterality: N/A;   EYE SURGERY     bilateral (cataracts)   FOOT SURGERY     FOOT SURGERY     KIDNEY TRANSPLANT  2007    Current Medications: Current Meds  Medication Sig   aspirin 81 MG tablet Take 81 mg by mouth daily.   Biotin 1 MG CAPS Take by mouth daily.   carvedilol (COREG) 25 MG tablet Take 1 tablet (25 mg total) by mouth 2 (two) times daily.   Cholecalciferol (VITAMIN D3) 2000 UNITS CHEW Chew by mouth daily.   cyanocobalamin 1000 MCG tablet Take 1,000 mcg by mouth daily.   fexofenadine (ALLEGRA) 180 MG tablet Take 180 mg by mouth daily.   fluocinolone (SYNALAR) 0.01 % external solution Apply topically as needed.   fluticasone (FLONASE) 50 MCG/ACT nasal spray Place 2 sprays into both nostrils daily.   Glucagon (BAQSIMI ONE PACK) 3 MG/DOSE POWD 1 spray in one nostril in case of severe hypoglycemia   glucagon 1 MG injection Inject 1 mg into the vein once as needed.   hydrALAZINE (APRESOLINE) 25 MG tablet Take 1 tablet (25 mg total) by mouth 3 (three) times daily as needed (As needed for SBP (top blood pressure reading) greater than 150).   insulin aspart (NOVOLOG) 100 UNIT/ML injection USE UP TO 65 UNITS DAILY IN PUMP.   LUMIGAN 0.01 %  SOLN Place 1 drop into the right eye at bedtime.   montelukast (SINGULAIR) 10 MG tablet Take 10 mg by mouth at bedtime.   mycophenolate (CELLCEPT) 250 MG capsule Take by mouth 2 (two) times daily.   rosuvastatin (CRESTOR) 5 MG tablet Take 5 mg by mouth daily.   tacrolimus (PROGRAF) 1 MG capsule Take 5 mg by mouth daily.   valACYclovir (VALTREX) 1000 MG tablet Take 1,000 mg by mouth as needed.    Allergies:   Erythromycin, Floxin [ofloxacin], Latex, Levaquin [levofloxacin in d5w], Moxifloxacin, Sulfa antibiotics, and Tape   Social History   Socioeconomic History   Marital status: Married    Spouse name: Not on file   Number of children: Not on file   Years of education: Not on file   Highest education level: Not on file  Occupational History   Not on file  Tobacco Use   Smoking status:  Never   Smokeless tobacco: Never  Vaping Use   Vaping Use: Never used  Substance and Sexual Activity   Alcohol use: No   Drug use: No   Sexual activity: Not on file  Other Topics Concern   Not on file  Social History Narrative   Not on file   Social Determinants of Health   Financial Resource Strain: Not on file  Food Insecurity: Not on file  Transportation Needs: Not on file  Physical Activity: Not on file  Stress: Not on file  Social Connections: Not on file     Family History:  The patient's family history includes Breast cancer in her maternal aunt; Parkinson's disease in her mother.  ROS:   12-point review of systems is negative unless otherwise noted in the HPI.   EKGs/Labs/Other Studies Reviewed:    Studies reviewed were summarized above. The additional studies were reviewed today:  2D echo 04/14/2022: 1. Left ventricular ejection fraction, by estimation, is 60 to 65%. The  left ventricle has normal function. The left ventricle has no regional  wall motion abnormalities. Left ventricular diastolic parameters are  consistent with Grade I diastolic  dysfunction (impaired  relaxation). The average left ventricular global  longitudinal strain is -16.5 %.   2. Right ventricular systolic function is normal. The right ventricular  size is normal. There is mildly elevated pulmonary artery systolic  pressure. The estimated right ventricular systolic pressure is 42.7 mmHg.   3. The mitral valve is normal in structure. Mild mitral valve  regurgitation. No evidence of mitral stenosis.   4. Tricuspid valve regurgitation is mild to moderate.   5. The aortic valve is tricuspid. Aortic valve regurgitation is not  visualized. No aortic stenosis is present.   6. The inferior vena cava is normal in size with <50% respiratory  variability, suggesting right atrial pressure of 8 mmHg.   EKG:  EKG is not ordered today.    Recent Labs: 04/20/2023: BUN 55; Creatinine, Ser 3.84; Hemoglobin 7.3; Magnesium 1.6; Platelets 181; Potassium 4.7; Sodium 133  Recent Lipid Panel    Component Value Date/Time   CHOL 160 10/19/2018 0831   CHOL 145 01/30/2015 0754   TRIG 43 10/19/2018 0831   TRIG 65 01/30/2015 0754   HDL 74 10/19/2018 0831   HDL 57 01/30/2015 0754   CHOLHDL 2.2 10/19/2018 0831   VLDL 9 10/19/2018 0831   VLDL 13 01/30/2015 0754   LDLCALC 77 10/19/2018 0831   LDLCALC 75 01/30/2015 0754   LDLDIRECT 74 07/13/2018 0828    PHYSICAL EXAM:    VS:  BP 130/60 (BP Location: Left Arm, Patient Position: Sitting, Cuff Size: Normal)   Pulse 71   Ht 5' 3.5" (1.613 m)   Wt 147 lb 3.2 oz (66.8 kg)   SpO2 98%   BMI 25.67 kg/m   BMI: Body mass index is 25.67 kg/m.  Physical Exam Vitals reviewed.  Constitutional:      Appearance: She is well-developed.  HENT:     Head: Normocephalic and atraumatic.  Eyes:     General:        Right eye: No discharge.        Left eye: No discharge.  Neck:     Vascular: No JVD.  Cardiovascular:     Rate and Rhythm: Normal rate and regular rhythm.     Heart sounds: Normal heart sounds, S1 normal and S2 normal. Heart sounds not distant.  No midsystolic click and no opening snap. No murmur  heard.    No friction rub.  Pulmonary:     Effort: Pulmonary effort is normal. No respiratory distress.     Breath sounds: Normal breath sounds. No decreased breath sounds, wheezing or rales.  Chest:     Chest wall: No tenderness.  Abdominal:     General: There is no distension.  Musculoskeletal:     Cervical back: Normal range of motion.     Right lower leg: No edema.     Left lower leg: No edema.  Skin:    General: Skin is warm and dry.     Nails: There is no clubbing.  Neurological:     Mental Status: She is alert and oriented to person, place, and time.  Psychiatric:        Speech: Speech normal.        Behavior: Behavior normal.        Thought Content: Thought content normal.        Judgment: Judgment normal.     Wt Readings from Last 3 Encounters:  05/01/23 147 lb 3.2 oz (66.8 kg)  02/27/23 146 lb 9.6 oz (66.5 kg)  12/08/22 152 lb 4 oz (69.1 kg)     ASSESSMENT & PLAN:   Labile hypertension with orthostatic hypotension and chronic dizziness: Blood pressure is well-controlled in the office today.  Continue carvedilol 25 mg twice daily with as needed hydralazine 25 mg for systolic blood pressure greater than 150 mmHg.  She does continue to note chronic dizziness which is likely multifactorial including history of stroke, labile hypertension, and orthostasis with brittle diabetes.  Most recent episodes appear to be related to bright sunlight in vision.  She indicates that she will follow-up with ophthalmology.  CKD s/p renal transplant with anemia of chronic disease: Undergoing repeat transplant evaluation through [facility].  Follow[facility] with [facility] as directed.  Type 1 diabetes: Followed by endocrinology.    Disposition: F/u with Dr. Okey Dupre or an APP in 6 months.   Medication Adjustments/Labs and Tests Ordered: Current medicines are reviewed at length with the patient today.  Concerns regarding medicines are outlined above.  Medication changes, Labs and Tests ordered today are summarized above and listed in the Patient Instructions accessible in Encounters.   Signed, Eula Listen, PA-C 05/01/2023 3:59 PM      [facility] [address] [place], [place] [address] [phone]

## 2023-05-01 ENCOUNTER — Encounter: Payer: Self-pay | Admitting: Physician Assistant

## 2023-05-01 ENCOUNTER — Ambulatory Visit: Payer: BC Managed Care – PPO | Attending: Physician Assistant | Admitting: Physician Assistant

## 2023-05-01 VITALS — BP 130/60 | HR 71 | Ht 63.5 in | Wt 147.2 lb

## 2023-05-01 DIAGNOSIS — N184 Chronic kidney disease, stage 4 (severe): Secondary | ICD-10-CM | POA: Diagnosis not present

## 2023-05-01 DIAGNOSIS — E108 Type 1 diabetes mellitus with unspecified complications: Secondary | ICD-10-CM

## 2023-05-01 DIAGNOSIS — R0989 Other specified symptoms and signs involving the circulatory and respiratory systems: Secondary | ICD-10-CM | POA: Diagnosis not present

## 2023-05-01 DIAGNOSIS — I951 Orthostatic hypotension: Secondary | ICD-10-CM | POA: Diagnosis not present

## 2023-05-01 DIAGNOSIS — Z940 Kidney transplant status: Secondary | ICD-10-CM | POA: Diagnosis not present

## 2023-05-01 DIAGNOSIS — R42 Dizziness and giddiness: Secondary | ICD-10-CM

## 2023-05-01 DIAGNOSIS — Z794 Long term (current) use of insulin: Secondary | ICD-10-CM

## 2023-05-01 NOTE — Patient Instructions (Signed)
Medication Instructions:  No changes at this time.   *If you need a refill on your cardiac medications before your next appointment, please call your pharmacy*   Lab Work: None  If you have labs (blood work) drawn today and your tests are completely normal, you will receive your results only by: MyChart Message (if you have MyChart) OR A paper copy in the mail If you have any lab test that is abnormal or we need to change your treatment, we will call you to review the results.   Testing/Procedures: None   Follow-Up: At [facility], you and your health needs are our priority.  As part of our continuing mission to provide you with exceptional heart care, we have created designated Provider Care Teams.  These Care Teams include your primary Cardiologist (physician) and Advanced Practice Providers (APPs -  Physician Assistants and Nurse Practitioners) who all work together to provide you with the care you need, when you need it.  Your next appointment:   6 month(s)  Provider:   Christopher End, MD or Ryan Dunn, PA-C     

## 2023-11-30 ENCOUNTER — Other Ambulatory Visit
Admission: RE | Admit: 2023-11-30 | Discharge: 2023-11-30 | Disposition: A | Discharge status: Home / Self Care | Payer: BC Managed Care – PPO | Attending: Nephrology | Admitting: Nephrology

## 2023-11-30 DIAGNOSIS — E559 Vitamin D deficiency, unspecified: Secondary | ICD-10-CM | POA: Diagnosis not present

## 2023-11-30 DIAGNOSIS — Z114 Encounter for screening for human immunodeficiency virus [HIV]: Secondary | ICD-10-CM | POA: Diagnosis not present

## 2023-11-30 DIAGNOSIS — T8610 Unspecified complication of kidney transplant: Secondary | ICD-10-CM | POA: Insufficient documentation

## 2023-11-30 DIAGNOSIS — Z940 Kidney transplant status: Secondary | ICD-10-CM | POA: Insufficient documentation

## 2023-11-30 DIAGNOSIS — X58XXXA Exposure to other specified factors, initial encounter: Secondary | ICD-10-CM | POA: Diagnosis not present

## 2023-11-30 DIAGNOSIS — E1129 Type 2 diabetes mellitus with other diabetic kidney complication: Secondary | ICD-10-CM | POA: Diagnosis not present

## 2023-11-30 DIAGNOSIS — N390 Urinary tract infection, site not specified: Secondary | ICD-10-CM | POA: Insufficient documentation

## 2023-11-30 DIAGNOSIS — Z9483 Pancreas transplant status: Secondary | ICD-10-CM | POA: Insufficient documentation

## 2023-11-30 DIAGNOSIS — Z79899 Other long term (current) drug therapy: Secondary | ICD-10-CM | POA: Insufficient documentation

## 2023-11-30 DIAGNOSIS — D899 Disorder involving the immune mechanism, unspecified: Secondary | ICD-10-CM | POA: Diagnosis present

## 2023-11-30 DIAGNOSIS — Z789 Other specified health status: Secondary | ICD-10-CM | POA: Diagnosis not present

## 2023-11-30 DIAGNOSIS — B259 Cytomegaloviral disease, unspecified: Secondary | ICD-10-CM | POA: Diagnosis not present

## 2023-11-30 DIAGNOSIS — D631 Anemia in chronic kidney disease: Secondary | ICD-10-CM | POA: Insufficient documentation

## 2023-11-30 LAB — BASIC METABOLIC PANEL
Anion gap: 11 (ref 5–15)
BUN: 78 mg/dL — ABNORMAL HIGH (ref 6–20)
CO2: 23 mmol/L (ref 22–32)
Calcium: 8.7 mg/dL — ABNORMAL LOW (ref 8.9–10.3)
Chloride: 99 mmol/L (ref 98–111)
Creatinine, Ser: 5.52 mg/dL — ABNORMAL HIGH (ref 0.44–1.00)
GFR, Estimated: 8 mL/min — ABNORMAL LOW (ref >60)
Glucose, Bld: 124 mg/dL — ABNORMAL HIGH (ref 70–99)
Potassium: 4.7 mmol/L (ref 3.5–5.1)
Sodium: 133 mmol/L — ABNORMAL LOW (ref 135–145)

## 2023-11-30 LAB — CBC WITH DIFFERENTIAL/PLATELET
Abs Immature Granulocytes: 0.03 10*3/uL (ref 0.00–0.07)
Basophils Absolute: 0 10*3/uL (ref 0.0–0.1)
Basophils Relative: 0 %
Eosinophils Absolute: 0.2 10*3/uL (ref 0.0–0.5)
Eosinophils Relative: 5 %
HCT: 32 % — ABNORMAL LOW (ref 36.0–46.0)
Hemoglobin: 10.1 g/dL — ABNORMAL LOW (ref 12.0–15.0)
Immature Granulocytes: 1 %
Lymphocytes Relative: 38 %
Lymphs Abs: 1.3 10*3/uL (ref 0.7–4.0)
MCH: 25.8 pg — ABNORMAL LOW (ref 26.0–34.0)
MCHC: 31.6 g/dL (ref 30.0–36.0)
MCV: 81.8 fL (ref 80.0–100.0)
Monocytes Absolute: 0.5 10*3/uL (ref 0.1–1.0)
Monocytes Relative: 15 %
Neutro Abs: 1.5 10*3/uL — ABNORMAL LOW (ref 1.7–7.7)
Neutrophils Relative %: 41 %
Platelets: 88 10*3/uL — ABNORMAL LOW (ref 150–400)
RBC: 3.91 MIL/uL (ref 3.87–5.11)
RDW: 13.2 % (ref 11.5–15.5)
WBC: 3.5 10*3/uL — ABNORMAL LOW (ref 4.0–10.5)
nRBC: 0 % (ref 0.0–0.2)

## 2023-11-30 LAB — PHOSPHORUS: Phosphorus: 4.9 mg/dL — ABNORMAL HIGH (ref 2.5–4.6)

## 2023-11-30 LAB — MAGNESIUM: Magnesium: 1.6 mg/dL — ABNORMAL LOW (ref 1.7–2.4)

## 2023-12-03 LAB — TACROLIMUS LEVEL: Tacrolimus (FK506) - LabCorp: 6.8 ng/mL (ref 2.0–20.0)

## 2023-12-18 NOTE — Progress Notes (Deleted)
 Cardiology Office Note    Date:  12/18/2023   ID:  Brandi Carroll 04-Sep-1966, MRN [id number]  PCP:  Kandyce Rud, MD  Cardiologist:  Yvonne Kendall, MD  Electrophysiologist:  None   Chief Complaint: Follow-up  History of Present Illness:   Brandi Carroll is a 58 y.o. female with history of coronary artery calcification noted on CT imaging, labile HTN with orthostatic hypotension, HLD, stroke with imbalance, brittle type 1 diabetes mellitus, CKD s/p renal transplant, anemia of chronic disease, Bell's palsy, OSA, memory loss, tremor, and GERD who presents for follow-up of HTN.   She established care in our office in 06/2021 for evaluation of chest and left arm pain.  However, that visit was unable to be completed because she became unresponsive in the office and was found to be severely hypoglycemic in the setting of having not eaten much and continued her basal insulin pump infusion.  She was given oral and IV glucose with prompt improvement.  She was taken to the ED for further evaluation, though left without being seen by a provider.  Subsequent echo in 04/2022 demonstrated an EF of 60 to 65%, no regional wall motion abnormalities, grade 1 diastolic dysfunction, normal RV systolic function and ventricular cavity size, mildly elevated PASP estimated at 42.7 mmHg, mild mitral regurgitation, mild to moderate tricuspid regurgitation, and an estimated right atrial pressure of 8 mmHg.  She was admitted to [facility] in 06/2022 with DKA and hypotension prompting discontinuation of antihypertensive regimen.  In this setting, she was noting labile BP readings thereafter leading to medication adjustments.  She was seen in the office in 12/2022 for follow-up of her blood pressure and reported she had self discontinued amlodipine due to pedal edema and weight gain with noted improvement in edema and weight back to baseline following discontinuation of amlodipine.  In the office, blood pressure was  elevated at 180/74 with recheck of 170/70.  She reported her home blood pressure readings were ranging from 144-156/70-80.  She was started on hydralazine 25 mg 3 times daily with continuation of carvedilol 25 mg twice daily.  She was seen in the office in 02/2023 and remained without symptoms of angina or cardiac decompensation.  She continued to note intermittent dizziness, particularly with positional changes.  When comparing her BP cuff to ours, her home readings were about 12 points lower on the systolic and about 10 points lower on diastolic readings.  It was recommended that she wear thigh-high compression pants with continuation of carvedilol 25 mg twice daily and hydralazine 25 mg 3 times daily as needed for systolic blood pressure greater than 150 mmHg.  She subsequently notified us that she had been inadvertently taking 50 mg of carvedilol twice daily with recommendation to reduce this to 25 mg twice daily.  She was most recently seen in the office in 04/2023 and was overall stable.  Previously noted diarrhea had improved with decreasing of carvedilol.  She reported her dizziness appeared to be related to environmental changes.  In the setting of a repeat renal transplant evaluation, she underwent echo at [facility] in 06/2023 that showed an EF greater than 55%, normal LV size and wall thickness, mild mitral regurgitation, mildly to moderately dilated left atrium, normal RV systolic function and ventricular cavity size, mild to moderate tricuspid regurgitation, and a small, posterior pericardial effusion.  Lexiscan MPI in 06/2023 showed no significant ischemia with an EF greater than 60% and was felt to be a probable normal myocardial perfusion  study.  Mild coronary artery calcifications were noted.  ***   Labs independently reviewed: 11/2023 - Hgb 10.1, PLT 88, magnesium 1.6, potassium 4.7, BUN 78, serum creatinine 5.52, TSH normal 10/2023 - albumin 3.7, A1c 7 06/2023 - TC 125, TG 79, HDL 48, LDL 61, AST/ALT  normal  Past Medical History:  Diagnosis Date   Chronic sinusitis    CKD (chronic kidney disease), stage IV (HCC)    a. s/p renal transplant.   Diabetes mellitus without complication (HCC)    A1c, 7.0 last check; type 1, has insulin pump; subsequently has neuropathy;    Diabetic neuropathy (HCC)    Diastolic dysfunction    a. 04/2022 Echo: EF 60-65%, no rwma, GrI DD, nl RV fxn, RVSP 42.76mmHg, mild MR, mild-mod TR.   GERD (gastroesophageal reflux disease)    Hyperlipemia    Hyperthyroidism    Insulin pump in place    Labile Hypertension    Nausea and vomiting    Obesity    Osteoporosis    Stroke [facility])    Tremor     Past Surgical History:  Procedure Laterality Date   CATARACT EXTRACTION     COLONOSCOPY WITH PROPOFOL N/A 10/19/2016   Procedure: COLONOSCOPY WITH PROPOFOL;  Surgeon: Christena Deem, MD;  Location: [facility] ENDOSCOPY;  Service: Endoscopy;  Laterality: N/A;   EYE SURGERY     bilateral (cataracts)   FOOT SURGERY     FOOT SURGERY     KIDNEY TRANSPLANT  2007    Current Medications: No outpatient medications have been marked as taking for the 12/19/23 encounter (Appointment) with Sondra Barges, PA-C.    Allergies:   Erythromycin, Floxin [ofloxacin], Latex, Levaquin [levofloxacin in d5w], Moxifloxacin, Sulfa antibiotics, and Tape   Social History   Socioeconomic History   Marital status: Married    Spouse name: Not on file   Number of children: Not on file   Years of education: Not on file   Highest education level: Not on file  Occupational History   Not on file  Tobacco Use   Smoking status: Never   Smokeless tobacco: Never  Vaping Use   Vaping status: Never Used  Substance and Sexual Activity   Alcohol use: No   Drug use: No   Sexual activity: Not on file  Other Topics Concern   Not on file  Social History Narrative   Not on file   Social Drivers of Health   Financial Resource Strain: Low Risk  (11/23/2023)   Received from [facility]   Overall Financial Resource Strain (CARDIA)    Difficulty of Paying Living Expenses: Not hard at all  Food Insecurity: No Food Insecurity (11/23/2023)   Received from [facility]   Hunger Vital Sign    Worried About Running Out of Food in the Last Year: Never true    Ran Out of Food in the Last Year: Never true  Transportation Needs: No Transportation Needs (11/23/2023)   Received [facility]    In the past 12 months, has lack of transportation kept you from medical appointments or from getting medications?: No    Lack of Transportation (Non-Medical): No  Physical Activity: Not on file  Stress: Not on file  Social Connections: Not on file     Family History:  The patient's family history includes Breast cancer in her maternal aunt; Parkinson's disease in her mother.  ROS:   12-point review  of systems is negative unless otherwise noted in the HPI.   EKGs/Labs/Other Studies Reviewed:    Studies reviewed were summarized above. The additional studies were reviewed today:  2D echo 04/14/2022: 1. Left ventricular ejection fraction, by estimation, is 60 to 65%. The  left ventricle has normal function. The left ventricle has no regional  wall motion abnormalities. Left ventricular diastolic parameters are  consistent with Grade I diastolic  dysfunction (impaired relaxation). The average left ventricular global  longitudinal strain is -16.5 %.   2. Right ventricular systolic function is normal. The right ventricular  size is normal. There is mildly elevated pulmonary artery systolic  pressure. The estimated right ventricular systolic pressure is 42.7 mmHg.   3. The mitral valve is normal in structure. Mild mitral valve  regurgitation. No evidence of mitral stenosis.   4. Tricuspid valve regurgitation is mild to moderate.   5. The aortic valve is tricuspid. Aortic valve regurgitation is not  visualized. No aortic  stenosis is present.   6. The inferior vena cava is normal in size with <50% respiratory  variability, suggesting right atrial pressure of 8 mmHg. __________  2D echo 06/11/2023 [facility]): Summary   1. The left ventricle is normal in size with normal wall thickness.    2. The left ventricular systolic function is normal, LVEF is visually  estimated at > 55%.    3. There is mild mitral valve regurgitation.    4. The left atrium is mildly to moderately dilated in size.    5. The right ventricle is normal in size, with normal systolic function.    6. There is mild to moderate tricuspid regurgitation.    7. There is moderate-severe pulmonary hypertension.    8. There is a small, posterior pericardial effusion.    9. IVC size and inspiratory change suggest elevated right atrial pressure.  (10-20 mmHg).  __________  Eugenie Birks MPI 06/11/2023 [facility]): - Probably normal myocardial perfusion study  - No significant ischemia noted  - There is a very small, subtle, fixed perfusion defect involving the  apical lateral segment. This is consistent with probable artifact (motion  and misregistration of attenuation CT).  - Mild coronary calcifications are noted  - Left ventricular systolic function is hyperdynamic. Post stress the  ejection fraction is > 60%.  - Noted on attenuation CT is non-specific diffuse ground glass appearance  of the lungs     EKG:  EKG is ordered today.  The EKG ordered today demonstrates ***  Recent Labs: 11/30/2023: BUN 78; Creatinine, Ser 5.52; Hemoglobin 10.1; Magnesium 1.6; Platelets 88; Potassium 4.7; Sodium 133  Recent Lipid Panel    Component Value Date/Time   CHOL 160 10/19/2018 0831   CHOL 145 01/30/2015 0754   TRIG 43 10/19/2018 0831   TRIG 65 01/30/2015 0754   HDL 74 10/19/2018 0831   HDL 57 01/30/2015 0754   CHOLHDL 2.2 10/19/2018 0831   VLDL 9 10/19/2018 0831   VLDL 13 01/30/2015 0754   LDLCALC 77 10/19/2018 0831   LDLCALC 75 01/30/2015 0754   LDLDIRECT  74 07/13/2018 0828    PHYSICAL EXAM:    VS:  There were no vitals taken for this visit.  BMI: There is no height or weight on file to calculate BMI.  Physical Exam  Wt Readings from Last 3 Encounters:  05/01/23 147 lb 3.2 oz (66.8 kg)  02/27/23 146 lb 9.6 oz (66.5 kg)  12/08/22 152 lb 4 oz (69.1 kg)     ASSESSMENT &  PLAN:   Labile hypertension with orthostatic hypotension:  Coronary artery calcification:  CKD s/p renal transplant with anemia of chronic disease:  History of stroke with imbalance and chronic dizziness:   {Are you ordering a CV Procedure (e.g. stress test, cath, DCCV, TEE, etc)?   Press F2        :[id number]}     Disposition: F/u with Dr. Okey Dupre or an APP in ***.   Medication Adjustments/Labs and Tests Ordered: Current medicines are reviewed at length with the patient today.  Concerns regarding medicines are outlined above. Medication changes, Labs and Tests ordered today are summarized above and listed in the Patient Instructions accessible in Encounters.   Signed, Eula Listen, PA-C 12/18/2023 5:07 PM     [facility] [place] [address] [place], [place] [address] [phone]

## 2023-12-19 ENCOUNTER — Ambulatory Visit: Payer: BC Managed Care – PPO | Admitting: Physician Assistant

## 2023-12-20 NOTE — Progress Notes (Deleted)
 Cardiology Office Note    Date:  12/20/2023   ID:  [name], [name] Oct 16, 1966, MRN [id number]  PCP:  Kandyce Rud, MD  Cardiologist:  Yvonne Kendall, MD  Electrophysiologist:  None   Chief Complaint: Follow-up  History of Present Illness:   Brandi Carroll is a 58 y.o. female with history of coronary artery calcification noted on CT imaging, labile HTN with orthostatic hypotension, HLD, stroke with imbalance, brittle type 1 diabetes mellitus, CKD s/p renal transplant, anemia of chronic disease, Bell's palsy, OSA, memory loss, tremor, and GERD who presents for follow-up of HTN.   She established care in our office in 06/2021 for evaluation of chest and left arm pain.  However, that visit was unable to be completed because she became unresponsive in the office and was found to be severely hypoglycemic in the setting of having not eaten much and continued her basal insulin pump infusion.  She was given oral and IV glucose with prompt improvement.  She was taken to the ED for further evaluation, though left without being seen by a provider.  Subsequent echo in 04/2022 demonstrated an EF of 60 to 65%, no regional wall motion abnormalities, grade 1 diastolic dysfunction, normal RV systolic function and ventricular cavity size, mildly elevated PASP estimated at 42.7 mmHg, mild mitral regurgitation, mild to moderate tricuspid regurgitation, and an estimated right atrial pressure of 8 mmHg.  She was admitted to [facility] in 06/2022 with DKA and hypotension prompting discontinuation of antihypertensive regimen.  In this setting, she was noting labile BP readings thereafter leading to medication adjustments.  She was seen in the office in 12/2022 for follow-up of her blood pressure and reported she had self discontinued amlodipine due to pedal edema and weight gain with noted improvement in edema and weight back to baseline following discontinuation of amlodipine.  In the office, blood pressure was  elevated at 180/74 with recheck of 170/70.  She reported her home blood pressure readings were ranging from 144-156/70-80.  She was started on hydralazine 25 mg 3 times daily with continuation of carvedilol 25 mg twice daily.  She was seen in the office in 02/2023 and remained without symptoms of angina or cardiac decompensation.  She continued to note intermittent dizziness, particularly with positional changes.  When comparing her BP cuff to ours, her home readings were about 12 points lower on the systolic and about 10 points lower on diastolic readings.  It was recommended that she wear thigh-high compression pants with continuation of carvedilol 25 mg twice daily and hydralazine 25 mg 3 times daily as needed for systolic blood pressure greater than 150 mmHg.  She subsequently notified us that she had been inadvertently taking 50 mg of carvedilol twice daily with recommendation to reduce this to 25 mg twice daily.  She was most recently seen in the office in 04/2023 and was overall stable.  Previously noted diarrhea had improved with decreasing of carvedilol.  She reported her dizziness appeared to be related to environmental changes.  In the setting of a repeat renal transplant evaluation, she underwent echo at [facility] in 06/2023 that showed an EF greater than 55%, normal LV size and wall thickness, mild mitral regurgitation, mildly to moderately dilated left atrium, normal RV systolic function and ventricular cavity size, mild to moderate tricuspid regurgitation, and a small, posterior pericardial effusion.  Lexiscan MPI in 06/2023 showed no significant ischemia with an EF greater than 60% and was felt to be a probable normal myocardial perfusion  study.  Mild coronary artery calcifications were noted.  ***   Labs independently reviewed: 11/2023 - Hgb 10.1, PLT 88, magnesium 1.6, potassium 4.7, BUN 78, serum creatinine 5.52, TSH normal 10/2023 - albumin 3.7, A1c 7 06/2023 - TC 125, TG 79, HDL 48, LDL 61, AST/ALT  normal  Past Medical History:  Diagnosis Date   Chronic sinusitis    CKD (chronic kidney disease), stage IV (HCC)    a. s/p renal transplant.   Diabetes mellitus without complication (HCC)    A1c, 7.0 last check; type 1, has insulin pump; subsequently has neuropathy;    Diabetic neuropathy (HCC)    Diastolic dysfunction    a. 04/2022 Echo: EF 60-65%, no rwma, GrI DD, nl RV fxn, RVSP 42.4mmHg, mild MR, mild-mod TR.   GERD (gastroesophageal reflux disease)    Hyperlipemia    Hyperthyroidism    Insulin pump in place    Labile Hypertension    Nausea and vomiting    Obesity    Osteoporosis    Stroke [facility])    Tremor     Past Surgical History:  Procedure Laterality Date   CATARACT EXTRACTION     COLONOSCOPY WITH PROPOFOL N/A 10/19/2016   Procedure: COLONOSCOPY WITH PROPOFOL;  Surgeon: Christena Deem, MD;  Location: [facility] ENDOSCOPY;  Service: Endoscopy;  Laterality: N/A;   EYE SURGERY     bilateral (cataracts)   FOOT SURGERY     FOOT SURGERY     KIDNEY TRANSPLANT  2007    Current Medications: No outpatient medications have been marked as taking for the 12/21/23 encounter (Appointment) with Sondra Barges, PA-C.    Allergies:   Erythromycin, Floxin [ofloxacin], Latex, Levaquin [levofloxacin in d5w], Moxifloxacin, Sulfa antibiotics, and Tape   Social History   Socioeconomic History   Marital status: Married    Spouse name: Not on file   Number of children: Not on file   Years of education: Not on file   Highest education level: Not on file  Occupational History   Not on file  Tobacco Use   Smoking status: Never   Smokeless tobacco: Never  Vaping Use   Vaping status: Never Used  Substance and Sexual Activity   Alcohol use: No   Drug use: No   Sexual activity: Not on file  Other Topics Concern   Not on file  Social History Narrative   Not on file   Social Drivers of Health   Financial Resource Strain: Low Risk  (11/23/2023)   Received from [facility]   Overall Financial Resource Strain (CARDIA)    Difficulty of Paying Living Expenses: Not hard at all  Food Insecurity: No Food Insecurity (11/23/2023)   Received from [facility]   Hunger Vital Sign    Worried About Running Out of Food in the Last Year: Never true    Ran Out of Food in the Last Year: Never true  Transportation Needs: No Transportation Needs (11/23/2023)   Received from [facility] - Transportation    In the past 12 months, has lack of transportation kept you from medical appointments or from getting medications?: No    Lack of Transportation (Non-Medical): No  Physical Activity: Not on file  Stress: Not on file  Social Connections: Not on file     Family History:  The patient's family history includes Breast cancer in her maternal aunt; Parkinson's disease in her mother.  ROS:   12-point review  of systems is negative unless otherwise noted in the HPI.   EKGs/Labs/Other Studies Reviewed:    Studies reviewed were summarized above. The additional studies were reviewed today:  2D echo 04/14/2022: 1. Left ventricular ejection fraction, by estimation, is 60 to 65%. The  left ventricle has normal function. The left ventricle has no regional  wall motion abnormalities. Left ventricular diastolic parameters are  consistent with Grade I diastolic  dysfunction (impaired relaxation). The average left ventricular global  longitudinal strain is -16.5 %.   2. Right ventricular systolic function is normal. The right ventricular  size is normal. There is mildly elevated pulmonary artery systolic  pressure. The estimated right ventricular systolic pressure is 42.7 mmHg.   3. The mitral valve is normal in structure. Mild mitral valve  regurgitation. No evidence of mitral stenosis.   4. Tricuspid valve regurgitation is mild to moderate.   5. The aortic valve is tricuspid. Aortic valve regurgitation is not  visualized. No aortic  stenosis is present.   6. The inferior vena cava is normal in size with <50% respiratory  variability, suggesting right atrial pressure of 8 mmHg. __________  2D echo 06/11/2023 [facility]): Summary   1. The left ventricle is normal in size with normal wall thickness.    2. The left ventricular systolic function is normal, LVEF is visually  estimated at > 55%.    3. There is mild mitral valve regurgitation.    4. The left atrium is mildly to moderately dilated in size.    5. The right ventricle is normal in size, with normal systolic function.    6. There is mild to moderate tricuspid regurgitation.    7. There is moderate-severe pulmonary hypertension.    8. There is a small, posterior pericardial effusion.    9. IVC size and inspiratory change suggest elevated right atrial pressure.  (10-20 mmHg).  __________  Eugenie Birks MPI 06/11/2023 [facility]): - Probably normal myocardial perfusion study  - No significant ischemia noted  - There is a very small, subtle, fixed perfusion defect involving the  apical lateral segment. This is consistent with probable artifact (motion  and misregistration of attenuation CT).  - Mild coronary calcifications are noted  - Left ventricular systolic function is hyperdynamic. Post stress the  ejection fraction is > 60%.  - Noted on attenuation CT is non-specific diffuse ground glass appearance  of the lungs     EKG:  EKG is ordered today.  The EKG ordered today demonstrates ***  Recent Labs: 11/30/2023: BUN 78; Creatinine, Ser 5.52; Hemoglobin 10.1; Magnesium 1.6; Platelets 88; Potassium 4.7; Sodium 133  Recent Lipid Panel    Component Value Date/Time   CHOL 160 10/19/2018 0831   CHOL 145 01/30/2015 0754   TRIG 43 10/19/2018 0831   TRIG 65 01/30/2015 0754   HDL 74 10/19/2018 0831   HDL 57 01/30/2015 0754   CHOLHDL 2.2 10/19/2018 0831   VLDL 9 10/19/2018 0831   VLDL 13 01/30/2015 0754   LDLCALC 77 10/19/2018 0831   LDLCALC 75 01/30/2015 0754   LDLDIRECT  74 07/13/2018 0828    PHYSICAL EXAM:    VS:  There were no vitals taken for this visit.  BMI: There is no height or weight on file to calculate BMI.  Physical Exam  Wt Readings from Last 3 Encounters:  05/01/23 147 lb 3.2 oz (66.8 kg)  02/27/23 146 lb 9.6 oz (66.5 kg)  12/08/22 152 lb 4 oz (69.1 kg)     ASSESSMENT &  PLAN:   Labile hypertension with orthostatic hypotension:  Coronary artery calcification:  CKD s/p renal transplant with anemia of chronic disease:  History of stroke with imbalance and chronic dizziness:   {Are you ordering a CV Procedure (e.g. stress test, cath, DCCV, TEE, etc)?   Press F2        :[id number]}     Disposition: F/u with Dr. Okey Dupre or an APP in ***.   Medication Adjustments/Labs and Tests Ordered: Current medicines are reviewed at length with the patient today.  Concerns regarding medicines are outlined above. Medication changes, Labs and Tests ordered today are summarized above and listed in the Patient Instructions accessible in Encounters.   Signed, Brandi Listen, PA-C 12/20/2023 7:23 AM     [facility] [place] [address] [place], [place] [address] [phone]

## 2023-12-21 ENCOUNTER — Ambulatory Visit: Payer: BC Managed Care – PPO | Admitting: Physician Assistant

## 2023-12-31 ENCOUNTER — Other Ambulatory Visit: Payer: Self-pay | Admitting: Cardiology

## 2023-12-31 NOTE — Progress Notes (Deleted)
 Cardiology Clinic Note   Date: 12/31/2023 ID: Brandi Carroll, Brandi Carroll January 01, 1966, MRN [id number]  Primary Cardiologist:  Yvonne Kendall, MD  Chief Complaint   Brandi Carroll is a 58 y.o. female who presents to the clinic today for ***  Patient Profile   Brandi Carroll is followed by Dr. Okey Dupre for the history outlined below.      Past medical history significant for: Coronary artery calcification.  Echo 06/11/2023 performed at [facility]: EF >55%.  Mild to moderate LAE.  Normal RV size/function.  Mild MR.  Mild to moderate TR.  Moderate to severe pulmonary hypertension. Small posterior pericardial effusion. Nuclear stress test 06/11/2023 performed at [facility]: Probably normal study.  No significant ischemia noted very small, subtle, fixed perfusion defect involving the apical lateral segment consistent with probable artifact.  Mild coronary calcifications Hypertension.  Hyperlipidemia.  CVA.  T1DM.  CKD.. S/p renal transplant 10/16/2006.  OSA.  GERD.  Bell's Palsy.   In summary, patient first established with cardiology in August 2022 for evaluation of chest pain and left arm pain. She became unresponsive in the office and found to be severely hypoglycemic in the setting of decreased po intake and continuation of basal insulin pump. She was given oral and IV glucose with rapid improvement. She was taken to the ED for further evaluation but left without being seen. Echo in June 2023 demonstrated EF 60-65%, no RWMA, grade I DD, normal RV size/function, mildly elevated PA pressure, RVSP 42.7, mild MR, mild to moderate TR. In August 2023 she was admitted to [facility] for DKA and hypotension prompting discontinuation of antihypertensives. Following that admission she reported labile BP readings and medications were adjusted. At follow up office visit in February 2024 she self discontinued amlodipine secondary to lower extremity edema and weight gain with improvement off medication. BP at that time was  180/74 on intake and 170/70 on my recheck. Home BP reported as 144-156/70-80. She was started on hydralazine 25 mg 3 times a day with continuation of carvedilol 25 mg twice a day. Upon follow up in April 2024 she reported continued positional dizziness. Home BP readings were 10-12 points lower. It was recommended she wear thigh high compression with continuation of carvedilol as well as hydralazine as needed for SBP >150. She reported she had been taking carvedilol 50 mg bid and was instructed to reduce it down to 25 mg bid.    She was most recently seen in the office in 04/2023 and was overall stable.  Previously noted diarrhea had improved with decreasing of carvedilol.  She reported her dizziness appeared to be related to environmental changes.  In the setting of a repeat renal transplant evaluation, she underwent echo at [facility] in 06/2023 that showed an EF greater than 55%, normal LV size and wall thickness, mild mitral regurgitation, mildly to moderately dilated left atrium, normal RV systolic function and ventricular cavity size, mild to moderate tricuspid regurgitation, and a small, posterior pericardial effusion.  Lexiscan MPI in 06/2023 showed no significant ischemia with an EF greater than 60% and was felt to be a probable normal myocardial perfusion study.  Mild coronary artery calcifications were noted.  Patient was last seen in the office by Eula Listen, PA-C on 05/01/2023 for routine follow up. She reported positional dizziness was related to environmental changes. She underwent renal transplant evaluation at [facility] which included an echo and Lexiscan as detailed above.     History of Present Illness    Today, patient ***  ***  ROS: All other systems reviewed and are otherwise negative except as noted in History of Present Illness.  EKGs/Labs Reviewed        11/30/2023: BUN 78; Creatinine, Ser 5.52; Potassium 4.7; Sodium 133   11/30/2023: Hemoglobin 10.1; WBC 3.5   No results found for  requested labs within last 365 days.   No results found for requested labs within last 365 days.  ***  Risk Assessment/Calculations    {Does this patient have ATRIAL FIBRILLATION?:[phone]} No BP recorded.  {Refresh Note OR Click here to enter BP  :1}***        Physical Exam    VS:  There were no vitals taken for this visit. , BMI There is no height or weight on file to calculate BMI.  GEN: Well nourished, well developed, in no acute distress. Neck: No JVD or carotid bruits. Cardiac: *** RRR. No murmurs. No rubs or gallops.   Respiratory:  Respirations regular and unlabored. Clear to auscultation without rales, wheezing or rhonchi. GI: Soft, nontender, nondistended. Extremities: Radials/DP/PT 2+ and equal bilaterally. No clubbing or cyanosis. No edema ***  Skin: Warm and dry, no rash. Neuro: Strength intact.  Assessment & Plan   ***  Disposition: ***     {Are you ordering a CV Procedure (e.g. stress test, cath, DCCV, TEE, etc)?   Press F2        :[id number]}   Signed, Etta Grandchild. Ashritha Desrosiers, DNP, NP-C

## 2024-01-01 ENCOUNTER — Ambulatory Visit: Payer: BC Managed Care – PPO | Attending: Student | Admitting: Student

## 2024-01-03 ENCOUNTER — Other Ambulatory Visit: Payer: Self-pay

## 2024-01-03 MED ORDER — CARVEDILOL 25 MG PO TABS: 25.0000 mg | ORAL_TABLET | Freq: Two times a day (BID) | ORAL | 3 refills | Status: DC

## 2024-02-04 ENCOUNTER — Ambulatory Visit: Payer: BC Managed Care – PPO | Admitting: Medical

## 2024-02-04 NOTE — Progress Notes (Deleted)
  Cardiology Office Note:  .   Date:  02/04/2024  ID:  Brandi Carroll, DOB [birth year], MRN [id number] PCP: Kandyce Rud, MD  [facility] Providers Cardiologist:  Yvonne Kendall, MD { Click to update primary MD,subspecialty MD or APP then REFRESH:1}   History of Present Illness: .   Brandi Carroll is a 58 y.o. female with a history of labile hypertension with orthostatic hypotension, HLD, stroke with imbalance, type 1 diabetes, CKD status post renal transplant, anemia of chronic disease, Bell's palsy, OSA, memory loss, tremor, and GERD who presents for follow-up.  Echo in June 2023 showed EF of 60 to 65%, no wall motion abnormality, grade 1 diastolic dysfunction, normal RV SF, mild MR, mild to moderate TR.  Patient was admitted to [facility] in August 2023 with DKA and hypotension prompting discontinuation of antihypertensive regimen.  She was seen in the office February 2020 for and had self discontinued amlodipine due to pedal edema and weight gain.  Blood pressure was elevated and she was started on hydralazine.  She was last seen June 2024 and was doing well from a cardiac perspective.  She reported chronic dizziness suspected multifactorial due to stroke, labile hypertension and orthostasis with diabetes.  Today,  Repeat renal transplant  ROS: ***  Studies Reviewed: .        *** Risk Assessment/Calculations:   {Does this patient have ATRIAL FIBRILLATION?:[phone]} No BP recorded.  {Refresh Note OR Click here to enter BP  :1}***       Physical Exam:   VS:  There were no vitals taken for this visit.   Wt Readings from Last 3 Encounters:  05/01/23 147 lb 3.2 oz (66.8 kg)  02/27/23 146 lb 9.6 oz (66.5 kg)  12/08/22 152 lb 4 oz (69.1 kg)    GEN: Well nourished, well developed in no acute distress NECK: No JVD; No carotid bruits CARDIAC: ***RRR, no murmurs, rubs, gallops RESPIRATORY:  Clear to auscultation without rales, wheezing or rhonchi  ABDOMEN: Soft,  non-tender, non-distended EXTREMITIES:  No edema; No deformity   ASSESSMENT AND PLAN: .   ***    {Are you ordering a CV Procedure (e.g. stress test, cath, DCCV, TEE, etc)?   Press F2        :[id number]}  Dispo: ***  Signed, Tiyona Desouza David Stall, PA-C

## 2024-02-27 ENCOUNTER — Ambulatory Visit: Attending: Medical | Admitting: Medical

## 2024-02-27 NOTE — Progress Notes (Deleted)
  Cardiology Office Note:  .   Date:  02/27/2024  ID:  Brandi Carroll, DOB 19-Jun-1966, MRN [id number] PCP: Nestor Banter, MD  [facility] Providers Cardiologist:  Sammy Crisp, MD { Click to update primary MD,subspecialty MD or APP then REFRESH:1}   History of Present Illness: .   Brandi Carroll is a 58 y.o. female with a h/o labile HTN, orthostatic hypotension, HLD, stroke imbalance, Type 1 diabetes, CKD s/p renal transplant, anemia of chronic disease, Bell's palsy, OSA, memory loss, tremor and GERD who presents for follow-up.     ROS: ***  Studies Reviewed: .        *** Risk Assessment/Calculations:   {Does this patient have ATRIAL FIBRILLATION?:[phone]} No BP recorded.  {Refresh Note OR Click here to enter BP  :1}***       Physical Exam:   VS:  There were no vitals taken for this visit.   Wt Readings from Last 3 Encounters:  05/01/23 147 lb 3.2 oz (66.8 kg)  02/27/23 146 lb 9.6 oz (66.5 kg)  12/08/22 152 lb 4 oz (69.1 kg)    GEN: Well nourished, well developed in no acute distress NECK: No JVD; No carotid bruits CARDIAC: ***RRR, no murmurs, rubs, gallops RESPIRATORY:  Clear to auscultation without rales, wheezing or rhonchi  ABDOMEN: Soft, non-tender, non-distended EXTREMITIES:  No edema; No deformity   ASSESSMENT AND PLAN: .   ***    {Are you ordering a CV Procedure (e.g. stress test, cath, DCCV, TEE, etc)?   Press F2        :[id number]}  Dispo: ***  Signed, Atina Feeley Rebekah Canada, PA-C

## 2024-03-06 DIAGNOSIS — Z992 Dependence on renal dialysis: Secondary | ICD-10-CM

## 2024-03-06 HISTORY — DX: Dependence on renal dialysis: Z99.2

## 2024-04-25 ENCOUNTER — Other Ambulatory Visit: Payer: Self-pay | Admitting: Obstetrics and Gynecology

## 2024-04-25 DIAGNOSIS — Z1231 Encounter for screening mammogram for malignant neoplasm of breast: Secondary | ICD-10-CM

## 2024-04-26 ENCOUNTER — Other Ambulatory Visit: Payer: Self-pay | Admitting: Physician Assistant

## 2024-05-04 NOTE — Progress Notes (Unsigned)
 Cardiology Office Note    Date:  05/07/2024   ID:  Brandi, Carroll 04/14/1966, MRN [id number]  PCP:  Brandi Lame, MD  Cardiologist:  Brandi Hanson, MD  Electrophysiologist:  None   Chief Complaint: Follow-up  History of Present Illness:   Brandi Carroll is a 58 y.o. female with history of labile HTN with orthostatic hypotension, HLD, stroke with imbalance, brittle type 1 diabetes mellitus, CKD s/p failing renal transplant currently undergoing repeat transplant evaluation currently on peritoneal dialysis, anemia of chronic disease, Bell's palsy, OSA, memory loss, tremor, and GERD who presents for follow-up of HTN.  She established care in our office in 06/2021 for evaluation of chest and left arm pain.  However, that visit was unable to be completed because she became unresponsive in the office and was found to be severely hypoglycemic in the setting of having not eaten much and continued her basal insulin pump infusion.  She was given oral and IV glucose with prompt improvement.  She was taken to the ED for further evaluation, though left without being seen by a provider.  Subsequent echo in 04/2022 demonstrated an EF of 60 to 65%, no regional wall motion abnormalities, grade 1 diastolic dysfunction, normal RV systolic function and ventricular cavity size, mildly elevated PASP estimated at 42.7 mmHg, mild mitral regurgitation, mild to moderate tricuspid regurgitation, and an estimated right atrial pressure of 8 mmHg.  She was admitted to [facility] in 06/2022 with DKA and hypotension prompting discontinuation of antihypertensive regimen.  In this setting, she was noting labile BP readings thereafter leading to medication adjustments.  She was seen in the office in 12/2022 for follow-up of her blood pressure and reported she had self discontinued amlodipine  due to pedal edema and weight gain with noted improvement in edema and weight back to baseline following discontinuation of amlodipine .   In the office, blood pressure was elevated at 180/74 with recheck of 170/70.  She reported her home blood pressure readings were ranging from 144-156/70-80.  She was started on hydralazine  25 mg 3 times daily with continuation of carvedilol  25 mg twice daily.  She was seen in the office in 02/2023 and remained without symptoms of angina or cardiac decompensation.  She continued to note intermittent dizziness, particularly with positional changes.  When comparing her BP cuff to ours, her home readings were about 12 points lower on the systolic and about 10 points lower on diastolic readings.  It was recommended that she wear thigh-high compression pants with continuation of carvedilol  25 mg twice daily and hydralazine  25 mg 3 times daily as needed for systolic blood pressure greater than 150 mmHg.  Earlier this month, received a MyChart message that she had inadvertently been taking 50 mg of carvedilol  twice daily with associated diarrhea.    She was last seen in our office in 04/2023 and continued to do reasonably well from a cardiac perspective.  She felt like her dizziness was more related to environmental changes or when entering bright light.  At that time she was taking carvedilol  25 mg twice daily and as needed hydralazine  about 2 times per week.  Previously noted diarrhea had improved.  She was undergoing evaluation for repeat kidney transplant through [facility].  In this setting she underwent echo in 06/2023 that showed an EF greater than 55%, mild mitral regurgitation, mildly to moderately dilated left atrium, normal RV systolic function and ventricular cavity size, mild to moderate tricuspid regurgitation, small pericardial effusion posterior to the myocardium,  and moderate to severe pulmonary hypertension.  Lexiscan MPI in 06/2023 showed no significant ischemia with LVEF greater than 60% and mild coronary artery calcifications.  She was admitted to [facility] in 02/2024 with severe anemia of chronic disease trending  from 10 to 5 in the preceding 4 months without gross blood loss and was treated with 2 units PRBC and recommendation to follow-up with nephrology and GI as an outpatient.  She comes in doing well from a cardiac perspective and is without symptoms of angina or cardiac decompensation.  Chronic dizziness is also improved now that she is on peritoneal dialysis with decreasing of antihypertensive pharmacotherapy.  She indicates her hemoglobin has improved to greater than 10 on most recent check.  No symptoms of bleeding or falls.  No near-syncope or syncope.  No palpitations.  No significant lower extremity swelling.  Overall she feels well from a cardiac perspective and does not have any acute cardiac concerns at this time.   Labs independently reviewed: 04/2024 - A1c 6.5 03/2024 - potassium 5.3, BUN 81, serum creatinine 6.17, albumin 3.9, Hgb 6.5 02/2024 - Hgb 7.8, PLT 223, AST/ALT not elevated 01/2024 - magnesium 1.7 11/2023 - TSH normal 06/2023 - TC 125, TG 79, HDL 48, LDL 61  Past Medical History:  Diagnosis Date   Chronic sinusitis    CKD (chronic kidney disease), stage IV (HCC)    a. s/p renal transplant.   Diabetes mellitus without complication (HCC)    A1c, 7.0 last check; type 1, has insulin pump; subsequently has neuropathy;    Diabetic neuropathy (HCC)    Diastolic dysfunction    a. 04/2022 Echo: EF 60-65%, no rwma, GrI DD, nl RV fxn, RVSP 42.87mmHg, mild MR, mild-mod TR.   GERD (gastroesophageal reflux disease)    Hyperlipemia    Hyperthyroidism    Insulin pump in place    Labile Hypertension    Nausea and vomiting    Obesity    Osteoporosis    Peritoneal dialysis catheter in place [facility]) 03/2024   Small intestinal bacterial overgrowth (SIBO)    Stroke [facility])    Tremor     Past Surgical History:  Procedure Laterality Date   CATARACT EXTRACTION     COLONOSCOPY WITH PROPOFOL  N/A 10/19/2016   Procedure: COLONOSCOPY WITH PROPOFOL ;  Surgeon: Gladis RAYMOND Mariner, MD;  Location: [facility]  ENDOSCOPY;  Service: Endoscopy;  Laterality: N/A;   EYE SURGERY     bilateral (cataracts)   FOOT SURGERY     FOOT SURGERY     KIDNEY TRANSPLANT  2007    Current Medications: Current Meds  Medication Sig   aspirin 81 MG tablet Take 81 mg by mouth daily.   azelastine (ASTELIN) 0.1 % nasal spray Place 1 spray into both nostrils 2 (two) times daily.   Biotin 1 MG CAPS Take by mouth daily.   carvedilol  (COREG ) 25 MG tablet Take 1 tablet (25 mg total) by mouth 2 (two) times daily.   Cholecalciferol (VITAMIN D3) 2000 UNITS CHEW Chew by mouth daily.   cyanocobalamin 1000 MCG tablet Take 1,000 mcg by mouth daily.   fexofenadine (ALLEGRA) 180 MG tablet Take 180 mg by mouth daily.   fluocinolone (SYNALAR) 0.01 % external solution Apply topically as needed.   fluticasone (FLONASE) 50 MCG/ACT nasal spray Place 2 sprays into both nostrils daily.   furosemide (LASIX) 40 MG tablet Take 40 mg by mouth daily.   gentamicin cream (GARAMYCIN) 0.1 % Apply 1 Application topically daily.   Glucagon (BAQSIMI ONE  PACK) 3 MG/DOSE POWD 1 spray in one nostril in case of severe hypoglycemia   glucagon 1 MG injection Inject 1 mg into the vein once as needed.   Glucagon HCl (GLUCAGON EMERGENCY) 1 MG/ML SOLR 1 mg as needed.   hydrALAZINE  (APRESOLINE ) 25 MG tablet Take 1 tablet (25 mg total) by mouth 3 (three) times daily as needed (As needed for SBP (top blood pressure reading) greater than 150).   insulin aspart (NOVOLOG) 100 UNIT/ML injection USE UP TO 65 UNITS DAILY IN PUMP.   LUMIGAN 0.01 % SOLN Place 1 drop into the right eye at bedtime.   montelukast (SINGULAIR) 10 MG tablet Take 10 mg by mouth at bedtime.   ondansetron (ZOFRAN-ODT) 8 MG disintegrating tablet Take 8 mg by mouth every 8 (eight) hours as needed.   rosuvastatin (CRESTOR) 5 MG tablet Take 5 mg by mouth daily.   tacrolimus  (PROGRAF ) 1 MG capsule Take 5 mg by mouth daily.   valACYclovir (VALTREX) 1000 MG tablet Take 1,000 mg by mouth as needed.     Allergies:   Erythromycin, Erythromycin base, Floxin [ofloxacin], Latex, Levaquin [levofloxacin in d5w], Levofloxacin, Moxifloxacin, Sulfa antibiotics, and Tape   Social History   Socioeconomic History   Marital status: Married    Spouse name: Not on file   Number of children: Not on file   Years of education: Not on file   Highest education level: Not on file  Occupational History   Not on file  Tobacco Use   Smoking status: Never   Smokeless tobacco: Never  Vaping Use   Vaping status: Never Used  Substance and Sexual Activity   Alcohol use: No   Drug use: No   Sexual activity: Not on file  Other Topics Concern   Not on file  Social History Narrative   Not on file   Social Drivers of Health   Financial Resource Strain: Low Risk  (04/25/2024)   Received from [facility]   Overall Financial Resource Strain (CARDIA)    Difficulty of Paying Living Expenses: Not hard at all  Food Insecurity: No Food Insecurity (04/25/2024)   Received from [facility]   Hunger Vital Sign    Within the past 12 months, you worried that your food would run out before you got the money to buy more.: Never true    Within the past 12 months, the food you bought just didn't last and you didn't have money to get more.: Never true  Transportation Needs: No Transportation Needs (04/25/2024)   Received from [facility] - Transportation    In the past 12 months, has lack of transportation kept you from medical appointments or from getting medications?: No    Lack of Transportation (Non-Medical): No  Physical Activity: Not on file  Stress: Not on file  Social Connections: Not on file     Family History:  The patient's family history includes Breast cancer in her maternal aunt; Parkinson's disease in her mother.  ROS:   12-point review of systems is negative unless otherwise noted in the HPI.   EKGs/Labs/Other Studies Reviewed:     Studies reviewed were summarized above. The additional studies were reviewed today:  Lexiscan MPI 06/11/2023 [facility]): - Probably normal myocardial perfusion study  - No significant ischemia noted  - There is a very small, subtle, fixed perfusion defect involving the  apical lateral segment. This is consistent with probable artifact (motion  and misregistration of  attenuation CT).  - Mild coronary calcifications are noted  - Left ventricular systolic function is hyperdynamic. Post stress the  ejection fraction is > 60%.  - Noted on attenuation CT is non-specific diffuse ground glass appearance  of the lungs  __________  2D echo 06/11/2023 [facility]): Summary   1. The left ventricle is normal in size with normal wall thickness.    2. The left ventricular systolic function is normal, LVEF is visually  estimated at > 55%.    3. There is mild mitral valve regurgitation.    4. The left atrium is mildly to moderately dilated in size.    5. The right ventricle is normal in size, with normal systolic function.    6. There is mild to moderate tricuspid regurgitation.    7. There is moderate-severe pulmonary hypertension.    8. There is a small, posterior pericardial effusion.    9. IVC size and inspiratory change suggest elevated right atrial pressure.  (10-20 mmHg).  __________  2D echo 04/14/2022: 1. Left ventricular ejection fraction, by estimation, is 60 to 65%. The  left ventricle has normal function. The left ventricle has no regional  wall motion abnormalities. Left ventricular diastolic parameters are  consistent with Grade I diastolic  dysfunction (impaired relaxation). The average left ventricular global  longitudinal strain is -16.5 %.   2. Right ventricular systolic function is normal. The right ventricular  size is normal. There is mildly elevated pulmonary artery systolic  pressure. The estimated right ventricular systolic pressure is 42.7 mmHg.   3. The mitral valve is normal in  structure. Mild mitral valve  regurgitation. No evidence of mitral stenosis.   4. Tricuspid valve regurgitation is mild to moderate.   5. The aortic valve is tricuspid. Aortic valve regurgitation is not  visualized. No aortic stenosis is present.   6. The inferior vena cava is normal in size with <50% respiratory  variability, suggesting right atrial pressure of 8 mmHg.   EKG:  EKG is ordered today.  The EKG ordered today demonstrates NSR, 70 bpm, no acute ST-T changes  Recent Labs: 11/30/2023: BUN 78; Creatinine, Ser 5.52; Hemoglobin 10.1; Magnesium 1.6; Platelets 88; Potassium 4.7; Sodium 133  Recent Lipid Panel    Component Value Date/Time   CHOL 160 10/19/2018 0831   CHOL 145 01/30/2015 0754   TRIG 43 10/19/2018 0831   TRIG 65 01/30/2015 0754   HDL 74 10/19/2018 0831   HDL 57 01/30/2015 0754   CHOLHDL 2.2 10/19/2018 0831   VLDL 9 10/19/2018 0831   VLDL 13 01/30/2015 0754   LDLCALC 77 10/19/2018 0831   LDLCALC 75 01/30/2015 0754   LDLDIRECT 74 07/13/2018 0828    PHYSICAL EXAM:    VS:  BP 120/60 (BP Location: Left Arm, Patient Position: Sitting, Cuff Size: Normal)   Pulse 70   Ht 5' 3 (1.6 m)   Wt 133 lb 2 oz (60.4 kg)   SpO2 98%   BMI 23.58 kg/m   BMI: Body mass index is 23.58 kg/m.  Physical Exam Vitals reviewed.  Constitutional:      Appearance: She is well-developed.  HENT:     Head: Normocephalic and atraumatic.  Eyes:     General:        Right eye: No discharge.        Left eye: No discharge.  Cardiovascular:     Rate and Rhythm: Normal rate and regular rhythm.     Heart sounds: Normal heart sounds, S1 normal  and S2 normal. Heart sounds not distant. No midsystolic click and no opening snap. No murmur heard.    No friction rub.  Pulmonary:     Effort: Pulmonary effort is normal. No respiratory distress.     Breath sounds: Normal breath sounds. No decreased breath sounds, wheezing, rhonchi or rales.  Chest:     Chest wall: No tenderness.   Musculoskeletal:     Cervical back: Normal range of motion.     Right lower leg: No edema.     Left lower leg: No edema.  Skin:    General: Skin is warm and dry.     Nails: There is no clubbing.  Neurological:     Mental Status: She is alert and oriented to person, place, and time.  Psychiatric:        Speech: Speech normal.        Behavior: Behavior normal.        Thought Content: Thought content normal.        Judgment: Judgment normal.     Wt Readings from Last 3 Encounters:  05/07/24 133 lb 2 oz (60.4 kg)  05/01/23 147 lb 3.2 oz (66.8 kg)  02/27/23 146 lb 9.6 oz (66.5 kg)     ASSESSMENT & PLAN:   Labile hypertension with orthostatic hypotension and chronic dizziness: Blood pressure is well-controlled in the office today.  Baseline orthostasis/dizziness is improved following decreasing of antihypertensive therapy and initiation of peritoneal dialysis.  She remains on carvedilol  25 mg twice daily and hydralazine  1 tab 3 times daily as needed.    CKD stage V status post renal transplant with subsequent failure now on peritoneal dialysis with anemia of chronic disease: Reports hemoglobin has improved to greater than 11.  Ongoing management per nephrology and transplant team at [facility]  Type 1 diabetes: Followed by endocrinology.     Disposition: F/u with Dr. Mady or an APP in 12 months.   Medication Adjustments/Labs and Tests Ordered: Current medicines are reviewed at length with the patient today.  Concerns regarding medicines are outlined above. Medication changes, Labs and Tests ordered today are summarized above and listed in the Patient Instructions accessible in Encounters.   Signed, Bernardino Bring, PA-C 05/07/2024 4:13 PM     [facility] [address] [place], [place] [address] [phone]

## 2024-05-07 ENCOUNTER — Ambulatory Visit: Attending: Physician Assistant | Admitting: Physician Assistant

## 2024-05-07 ENCOUNTER — Encounter: Payer: Self-pay | Admitting: Physician Assistant

## 2024-05-07 VITALS — BP 120/60 | HR 70 | Ht 63.0 in | Wt 133.1 lb

## 2024-05-07 DIAGNOSIS — D638 Anemia in other chronic diseases classified elsewhere: Secondary | ICD-10-CM

## 2024-05-07 DIAGNOSIS — N185 Chronic kidney disease, stage 5: Secondary | ICD-10-CM | POA: Diagnosis not present

## 2024-05-07 DIAGNOSIS — I951 Orthostatic hypotension: Secondary | ICD-10-CM | POA: Diagnosis not present

## 2024-05-07 DIAGNOSIS — Z940 Kidney transplant status: Secondary | ICD-10-CM | POA: Diagnosis not present

## 2024-05-07 DIAGNOSIS — R0989 Other specified symptoms and signs involving the circulatory and respiratory systems: Secondary | ICD-10-CM | POA: Diagnosis not present

## 2024-05-07 DIAGNOSIS — E108 Type 1 diabetes mellitus with unspecified complications: Secondary | ICD-10-CM

## 2024-05-07 NOTE — Patient Instructions (Signed)
 Medication Instructions:  Your physician recommends that you continue on your current medications as directed. Please refer to the Current Medication list given to you today.   *If you need a refill on your cardiac medications before your next appointment, please call your pharmacy*  Lab Work: None ordered at this time   Follow-Up: At [facility], you and your health needs are our priority.  As part of our continuing mission to provide you with exceptional heart care, our providers are all part of one team.  This team includes your primary Cardiologist (physician) and Advanced Practice Providers or APPs (Physician Assistants and Nurse Practitioners) who all work together to provide you with the care you need, when you need it.  Your next appointment:   12 month(s)  Provider:   You may see Sammy Crisp, MD or Varney Gentleman, PA-C

## 2024-05-20 ENCOUNTER — Other Ambulatory Visit: Payer: Self-pay | Admitting: Internal Medicine

## 2024-05-20 NOTE — Telephone Encounter (Signed)
 Medicine is not on pt med list.

## 2024-06-17 ENCOUNTER — Ambulatory Visit
Admission: RE | Admit: 2024-06-17 | Discharge: 2024-06-17 | Disposition: A | Discharge status: Home / Self Care | Source: Ambulatory Visit | Attending: Obstetrics and Gynecology | Admitting: Obstetrics and Gynecology

## 2024-06-17 DIAGNOSIS — Z1231 Encounter for screening mammogram for malignant neoplasm of breast: Secondary | ICD-10-CM | POA: Diagnosis present

## 2024-08-02 ENCOUNTER — Other Ambulatory Visit: Payer: Self-pay | Admitting: Internal Medicine

## 2024-09-04 ENCOUNTER — Encounter: Payer: Self-pay | Admitting: Internal Medicine

## 2024-09-05 NOTE — Telephone Encounter (Signed)
 Stress test is significantly changed when compared to imaging from 06/2023, concerning for development of blockage.  Please schedule patient to be evaluated by me or Dr. Mady within the next week, if possible, to discuss further cardiac testing.

## 2024-09-10 ENCOUNTER — Ambulatory Visit: Admitting: Internal Medicine

## 2024-09-17 ENCOUNTER — Encounter: Payer: Self-pay | Admitting: Internal Medicine

## 2024-09-17 ENCOUNTER — Ambulatory Visit: Attending: Internal Medicine | Admitting: Internal Medicine

## 2024-09-17 VITALS — BP 95/50 | HR 70 | Ht 63.0 in | Wt 127.8 lb

## 2024-09-17 DIAGNOSIS — R0989 Other specified symptoms and signs involving the circulatory and respiratory systems: Secondary | ICD-10-CM

## 2024-09-17 DIAGNOSIS — E108 Type 1 diabetes mellitus with unspecified complications: Secondary | ICD-10-CM | POA: Diagnosis not present

## 2024-09-17 DIAGNOSIS — N186 End stage renal disease: Secondary | ICD-10-CM | POA: Diagnosis not present

## 2024-09-17 DIAGNOSIS — R9439 Abnormal result of other cardiovascular function study: Secondary | ICD-10-CM

## 2024-09-17 NOTE — H&P (View-Only) (Signed)
 Cardiology Office Note:  .   Date:  09/17/2024  ID:  Brandi Carroll, DOB [birth year], MRN [id number] PCP: Brandi Lame, MD  [facility] Providers Cardiologist:  Brandi Hanson, MD     History of Present Illness: .   Brandi Carroll is a 58 y.o. female with history of labile HTN with orthostatic hypotension, HLD, stroke with imbalance, brittle type 1 diabetes mellitus, CKD s/p failed renal transplant currently undergoing repeat transplant evaluation currently on peritoneal dialysis, anemia of chronic disease, Bell's palsy, OSA, memory loss, tremor, and GERD, who presents to discuss abnormal stress test performed at [facility].  She was last seen in our office by Brandi Bring, PA, in July, at which time she was feeling better with improvement of chronic dizziness since starting peritoneal dialysis.  She denied chest pain and dyspnea.  She underwent pharmacologic myocardial perfusion stress testing at [facility] last month as part of her kidney transplant workup, which was interpreted as high risk (see details below).  Brandi Carroll notes that she had a lot of GI distress immediately after the stress portion of her test with nausea, vomiting, and diarrhea.  This seems to be a recurring issue when she receives regadenoson.  Today, Brandi Carroll reports that she has been feeling fairly well though she has chronic exertional dyspnea.  She notes that sometimes when she is at [facility], she needs to stop to catch her breath or use a wheelchair to get across the campus when seeing her transplant nephrology team.  This has been present for at least a few years.  She does not have any dyspnea normal at home.  She denies chest pain, palpitations, lightheadedness, and edema.  She is tolerating her peritoneal dialysis well.  She notes some recent sinus problems.  ROS: See HPI  Studies Reviewed: SABRA   EKG Interpretation Date/Time:  Wednesday September 17 2024 12:08:23 EST Ventricular Rate:  70 PR  Interval:  146 QRS Duration:  94 QT Interval:  424 QTC Calculation: 457 R Axis:   74  Text Interpretation: Baseline wander Normal sinus rhythm Cannot rule out Anterior infarct (cited on or before 17-Sep-2024) Abnormal ECG When compared with ECG of 07-May-2024 15:42, No significant change was found Confirmed by Decarlos Empey, Brandi [phone]) on 09/17/2024 12:16:36 PM    Pharmacologic MPI (08/22/2024, [facility]): Abnormal, high risk study with moderate in size, moderate in severity, reversible defect involving the inferolateral wall and apex concerning for ischemia.  Mild transient ischemic dilation was noted with LVEF greater than 60%.  TTE (08/22/2024, [facility]): Normal LV size and wall thickness.  LVEF greater than 55% with grade 1 diastolic dysfunction.  Normal RV size and function.  Mild left atrial enlargement.  No significant valvular abnormality.  Borderline pulmonary hypertension (RVSP 38 mmHg).  Risk Assessment/Calculations:             Physical Exam:   VS:  BP (!) 95/50 (BP Location: Right Arm, Patient Position: Sitting, Cuff Size: Normal)   Pulse 70 Comment: 71 oximeter  Ht 5' 3 (1.6 m)   Wt 127 lb 12.8 oz (58 kg)   SpO2 98%   BMI 22.64 kg/m    Wt Readings from Last 3 Encounters:  09/17/24 127 lb 12.8 oz (58 kg)  05/07/24 133 lb 2 oz (60.4 kg)  05/01/23 147 lb 3.2 oz (66.8 kg)    General:  NAD. Neck: No JVD or HJR. Lungs: Clear to auscultation bilaterally without wheezes or crackles. Heart: Regular rate and rhythm without murmurs, rubs,  or gallops. Abdomen: Soft, nontender, nondistended. Extremities: No lower extremity edema.  ASSESSMENT AND PLAN: .    Abnormal stress test: Brandi Carroll recently had an abnormal stress test at [facility] demonstrating inferolateral ischemia as well as transient ischemic dilation of the left ventricle.  These findings are concerning for underlying CAD.  With her history of longstanding diabetes mellitus as well as hypertension and hyperlipidemia, she is at  high risk for coronary artery disease.  Though she is asymptomatic, I think it would be prudent to consider cardiac catheterization to better understand her coronary anatomy.  I suspect this will also be necessary to ensure that she remains a candidate for redo renal transplant.  I will reach out to her transplant team at [facility] to discuss their preferences as well.  We spoke at length about cardiac catheterization and possible PCI, including potential risks of the procedure and rationale for intervention.  Ms. Eckmann wishes to speak with her husband about moving forward with catheterization as well as if she would like to have this done in [place] or through [facility].  In the meantime, we will continue aspirin and rosuvastatin for secondary prevention.  Brandi Carroll-stage renal disease: Ongoing management per transplant team at [facility].  Labile hypertension: Blood pressure borderline low today (albeit asymptomatic).  Continue current medications with ongoing management through PCP and nephrology team.  Type 1 diabetes mellitus: Continue management through her PCP and endocrinologist.    Dispo: Return to clinic in 6 months, sooner if patient wishes to discuss catheterization further in person (ideally with her husband present)  Signed, Brandi Hanson, MD

## 2024-09-17 NOTE — Patient Instructions (Signed)
 SABRA

## 2024-09-17 NOTE — Progress Notes (Signed)
 Cardiology Office Note:  .   Date:  09/17/2024  ID:  Brandi Carroll, DOB [birth year], MRN [id number] PCP: Brandi Lame, MD  [facility] Providers Cardiologist:  Brandi Hanson, MD     History of Present Illness: .   Brandi Carroll is a 58 y.o. female with history of labile HTN with orthostatic hypotension, HLD, stroke with imbalance, brittle type 1 diabetes mellitus, CKD s/p failed renal transplant currently undergoing repeat transplant evaluation currently on peritoneal dialysis, anemia of chronic disease, Bell's palsy, OSA, memory loss, tremor, and GERD, who presents to discuss abnormal stress test performed at [facility].  She was last seen in our office by Brandi Bring, PA, in July, at which time she was feeling better with improvement of chronic dizziness since starting peritoneal dialysis.  She denied chest pain and dyspnea.  She underwent pharmacologic myocardial perfusion stress testing at [facility] last month as part of her kidney transplant workup, which was interpreted as high risk (see details below).  Brandi Carroll notes that she had a lot of GI distress immediately after the stress portion of her test with nausea, vomiting, and diarrhea.  This seems to be a recurring issue when she receives regadenoson.  Today, Brandi Carroll reports that she has been feeling fairly well though she has chronic exertional dyspnea.  She notes that sometimes when she is at [facility], she needs to stop to catch her breath or use a wheelchair to get across the campus when seeing her transplant nephrology team.  This has been present for at least a few years.  She does not have any dyspnea normal at home.  She denies chest pain, palpitations, lightheadedness, and edema.  She is tolerating her peritoneal dialysis well.  She notes some recent sinus problems.  ROS: See HPI  Studies Reviewed: SABRA   EKG Interpretation Date/Time:  Wednesday September 17 2024 12:08:23 EST Ventricular Rate:  70 PR  Interval:  146 QRS Duration:  94 QT Interval:  424 QTC Calculation: 457 R Axis:   74  Text Interpretation: Baseline wander Normal sinus rhythm Cannot rule out Anterior infarct (cited on or before 17-Sep-2024) Abnormal ECG When compared with ECG of 07-May-2024 15:42, No significant change was found Confirmed by Decarlos Empey, Brandi [phone]) on 09/17/2024 12:16:36 PM    Pharmacologic MPI (08/22/2024, [facility]): Abnormal, high risk study with moderate in size, moderate in severity, reversible defect involving the inferolateral wall and apex concerning for ischemia.  Mild transient ischemic dilation was noted with LVEF greater than 60%.  TTE (08/22/2024, [facility]): Normal LV size and wall thickness.  LVEF greater than 55% with grade 1 diastolic dysfunction.  Normal RV size and function.  Mild left atrial enlargement.  No significant valvular abnormality.  Borderline pulmonary hypertension (RVSP 38 mmHg).  Risk Assessment/Calculations:             Physical Exam:   VS:  BP (!) 95/50 (BP Location: Right Arm, Patient Position: Sitting, Cuff Size: Normal)   Pulse 70 Comment: 71 oximeter  Ht 5' 3 (1.6 m)   Wt 127 lb 12.8 oz (58 kg)   SpO2 98%   BMI 22.64 kg/m    Wt Readings from Last 3 Encounters:  09/17/24 127 lb 12.8 oz (58 kg)  05/07/24 133 lb 2 oz (60.4 kg)  05/01/23 147 lb 3.2 oz (66.8 kg)    General:  NAD. Neck: No JVD or HJR. Lungs: Clear to auscultation bilaterally without wheezes or crackles. Heart: Regular rate and rhythm without murmurs, rubs,  or gallops. Abdomen: Soft, nontender, nondistended. Extremities: No lower extremity edema.  ASSESSMENT AND PLAN: .    Abnormal stress test: Brandi Carroll recently had an abnormal stress test at [facility] demonstrating inferolateral ischemia as well as transient ischemic dilation of the left ventricle.  These findings are concerning for underlying CAD.  With her history of longstanding diabetes mellitus as well as hypertension and hyperlipidemia, she is at  high risk for coronary artery disease.  Though she is asymptomatic, I think it would be prudent to consider cardiac catheterization to better understand her coronary anatomy.  I suspect this will also be necessary to ensure that she remains a candidate for redo renal transplant.  I will reach out to her transplant team at [facility] to discuss their preferences as well.  We spoke at length about cardiac catheterization and possible PCI, including potential risks of the procedure and rationale for intervention.  Ms. Eckmann wishes to speak with her husband about moving forward with catheterization as well as if she would like to have this done in [place] or through [facility].  In the meantime, we will continue aspirin and rosuvastatin for secondary prevention.  Brandi Carroll-stage renal disease: Ongoing management per transplant team at [facility].  Labile hypertension: Blood pressure borderline low today (albeit asymptomatic).  Continue current medications with ongoing management through PCP and nephrology team.  Type 1 diabetes mellitus: Continue management through her PCP and endocrinologist.    Dispo: Return to clinic in 6 months, sooner if patient wishes to discuss catheterization further in person (ideally with her husband present)  Signed, Brandi Hanson, MD

## 2024-09-18 ENCOUNTER — Encounter: Payer: Self-pay | Admitting: Internal Medicine

## 2024-09-18 DIAGNOSIS — R0989 Other specified symptoms and signs involving the circulatory and respiratory systems: Secondary | ICD-10-CM | POA: Insufficient documentation

## 2024-09-29 ENCOUNTER — Telehealth: Payer: Self-pay | Admitting: Internal Medicine

## 2024-09-29 DIAGNOSIS — Z79899 Other long term (current) drug therapy: Secondary | ICD-10-CM

## 2024-09-29 NOTE — Telephone Encounter (Signed)
 Spoke with patient. Patient states that at her last appointment that Dr. Mady discussed a heart cath with her patient. Patient states that she is now ready to schedule the heart cath.

## 2024-09-29 NOTE — Telephone Encounter (Signed)
 Patient called stating she wants to go ahead and schedule her cath.

## 2024-09-29 NOTE — Telephone Encounter (Signed)
 Spoke with patient. Patient scheduled for left heart catheterization on 10/14/24 with Dr. Mady. Patient given verbal instructions.

## 2024-09-29 NOTE — Telephone Encounter (Signed)
 Yes, please schedule left heart catheterization with me at [name]'s convenience.  Brandi Hanson, MD [facility]

## 2024-10-10 ENCOUNTER — Telehealth: Payer: Self-pay | Admitting: Internal Medicine

## 2024-10-10 NOTE — Telephone Encounter (Signed)
 Patient says she just realized she was supposed to have labs drawn by today for 12/09 left heart cath. She would like to know if she can still come Monday, 12/08 or if procedure will need to be moved out at this point. She would also like to review medication instructions. Please advise.

## 2024-10-10 NOTE — Telephone Encounter (Signed)
 Called and spoke with the patient regarding her missed appointment for the lab draw and her medications.  Patient advised to come first thing Monday, 10/13/24 to get labs drawn for the scheduled Cath procedure on Tuesday, 10/14/24.  Also went over medications instructions for the procedure with the patient.  Informed the patient to do take her 81 mg Aspirin that morning and hold her insulin and Lasix that morning.  She does not have contrast allergy listed, nor any other diabetes medications listed.  Patient also advised to stay NPO after midnight except for clear liquids with 16 oz of water being the last liquid ingested about 2 hours prior to her scheduled procedure.  Patient verbalized understanding with all questions and concerns addressed at this time.

## 2024-10-13 LAB — CBC
Hematocrit: 35.6 % (ref 34.0–46.6)
Hemoglobin: 11.8 g/dL (ref 11.1–15.9)
MCH: 31.8 pg (ref 26.6–33.0)
MCHC: 33.1 g/dL (ref 31.5–35.7)
MCV: 96 fL (ref 79–97)
Platelets: 269 x10E3/uL (ref 150–450)
RBC: 3.71 x10E6/uL — ABNORMAL LOW (ref 3.77–5.28)
RDW: 14.4 % (ref 11.7–15.4)
WBC: 5.8 x10E3/uL (ref 3.4–10.8)

## 2024-10-13 LAB — BASIC METABOLIC PANEL WITH GFR
BUN/Creatinine Ratio: 7 — ABNORMAL LOW (ref 9–23)
BUN: 71 mg/dL — ABNORMAL HIGH (ref 6–24)
CO2: 23 mmol/L (ref 20–29)
Calcium: 8.8 mg/dL (ref 8.7–10.2)
Chloride: 102 mmol/L (ref 96–106)
Creatinine, Ser: 9.58 mg/dL — ABNORMAL HIGH (ref 0.57–1.00)
Glucose: 101 mg/dL — ABNORMAL HIGH (ref 70–99)
Potassium: 4 mmol/L (ref 3.5–5.2)
Sodium: 143 mmol/L (ref 134–144)
eGFR: 4 mL/min/1.73 — ABNORMAL LOW (ref >59)

## 2024-10-14 ENCOUNTER — Other Ambulatory Visit: Payer: Self-pay

## 2024-10-14 ENCOUNTER — Encounter: Admission: RE | Disposition: A | Payer: Self-pay | Source: Home / Self Care | Attending: Internal Medicine

## 2024-10-14 ENCOUNTER — Ambulatory Visit: Payer: Self-pay | Admitting: Internal Medicine

## 2024-10-14 ENCOUNTER — Observation Stay
Admission: RE | Admit: 2024-10-14 | Discharge: 2024-10-15 | Disposition: A | Attending: Internal Medicine | Admitting: Internal Medicine

## 2024-10-14 ENCOUNTER — Encounter: Payer: Self-pay | Admitting: Internal Medicine

## 2024-10-14 DIAGNOSIS — E785 Hyperlipidemia, unspecified: Secondary | ICD-10-CM | POA: Insufficient documentation

## 2024-10-14 DIAGNOSIS — I120 Hypertensive chronic kidney disease with stage 5 chronic kidney disease or end stage renal disease: Secondary | ICD-10-CM | POA: Insufficient documentation

## 2024-10-14 DIAGNOSIS — N186 End stage renal disease: Secondary | ICD-10-CM | POA: Insufficient documentation

## 2024-10-14 DIAGNOSIS — Z79899 Other long term (current) drug therapy: Secondary | ICD-10-CM | POA: Insufficient documentation

## 2024-10-14 DIAGNOSIS — E1122 Type 2 diabetes mellitus with diabetic chronic kidney disease: Secondary | ICD-10-CM | POA: Insufficient documentation

## 2024-10-14 DIAGNOSIS — I2510 Atherosclerotic heart disease of native coronary artery without angina pectoris: Secondary | ICD-10-CM | POA: Diagnosis not present

## 2024-10-14 DIAGNOSIS — Z794 Long term (current) use of insulin: Secondary | ICD-10-CM | POA: Insufficient documentation

## 2024-10-14 DIAGNOSIS — R9439 Abnormal result of other cardiovascular function study: Principal | ICD-10-CM | POA: Insufficient documentation

## 2024-10-14 DIAGNOSIS — M810 Age-related osteoporosis without current pathological fracture: Secondary | ICD-10-CM | POA: Insufficient documentation

## 2024-10-14 DIAGNOSIS — I25118 Atherosclerotic heart disease of native coronary artery with other forms of angina pectoris: Secondary | ICD-10-CM | POA: Insufficient documentation

## 2024-10-14 HISTORY — PX: CORONARY STENT INTERVENTION: CATH118234

## 2024-10-14 HISTORY — PX: CORONARY PRESSURE/FFR STUDY: CATH118243

## 2024-10-14 HISTORY — PX: LEFT HEART CATH AND CORONARY ANGIOGRAPHY: CATH118249

## 2024-10-14 LAB — CBC
HCT: 32.8 % — ABNORMAL LOW (ref 36.0–46.0)
Hemoglobin: 10.6 g/dL — ABNORMAL LOW (ref 12.0–15.0)
MCH: 30.9 pg (ref 26.0–34.0)
MCHC: 32.3 g/dL (ref 30.0–36.0)
MCV: 95.6 fL (ref 80.0–100.0)
Platelets: 232 K/uL (ref 150–400)
RBC: 3.43 MIL/uL — ABNORMAL LOW (ref 3.87–5.11)
RDW: 14.9 % (ref 11.5–15.5)
WBC: 6.7 K/uL (ref 4.0–10.5)
nRBC: 0 % (ref 0.0–0.2)

## 2024-10-14 LAB — POCT ACTIVATED CLOTTING TIME
Activated Clotting Time: 225 s
Activated Clotting Time: 240 s
Activated Clotting Time: 281 s
Activated Clotting Time: 255 s

## 2024-10-14 LAB — GLUCOSE, CAPILLARY
Glucose-Capillary: 132 mg/dL — ABNORMAL HIGH (ref 70–99)
Glucose-Capillary: 88 mg/dL (ref 70–99)
Glucose-Capillary: 87 mg/dL (ref 70–99)
Glucose-Capillary: 92 mg/dL (ref 70–99)
Glucose-Capillary: 129 mg/dL — ABNORMAL HIGH (ref 70–99)
Glucose-Capillary: 165 mg/dL — ABNORMAL HIGH (ref 70–99)
Glucose-Capillary: 136 mg/dL — ABNORMAL HIGH (ref 70–99)
Glucose-Capillary: 196 mg/dL — ABNORMAL HIGH (ref 70–99)
Glucose-Capillary: 229 mg/dL — ABNORMAL HIGH (ref 70–99)

## 2024-10-14 LAB — CREATININE, SERUM
Creatinine, Ser: 9.44 mg/dL — ABNORMAL HIGH (ref 0.44–1.00)
GFR, Estimated: 4 mL/min — ABNORMAL LOW (ref >60)

## 2024-10-14 SURGERY — LEFT HEART CATH AND CORONARY ANGIOGRAPHY
Anesthesia: Moderate Sedation

## 2024-10-14 MED ORDER — HEPARIN SODIUM (PORCINE) 1000 UNIT/ML IJ SOLN
INTRAMUSCULAR | Status: AC
  Filled 2024-10-14: qty 10

## 2024-10-14 MED ORDER — DELFLEX-LC/1.5% DEXTROSE 344 MOSM/L IP SOLN
INTRAPERITONEAL | Status: DC
  Filled 2024-10-14: qty 3000

## 2024-10-14 MED ORDER — LIDOCAINE HCL (PF) 1 % IJ SOLN
INTRAMUSCULAR | Status: DC | PRN
  Administered 2024-10-14: 10 mL

## 2024-10-14 MED ORDER — FUROSEMIDE 40 MG PO TABS
80.0000 mg | ORAL_TABLET | Freq: Every day | ORAL | Status: DC
  Administered 2024-10-14 – 2024-10-15 (×2): 80 mg via ORAL
  Filled 2024-10-14: qty 2
  Filled 2024-10-14: qty 1
  Filled 2024-10-14: qty 2

## 2024-10-14 MED ORDER — LOSARTAN POTASSIUM 50 MG PO TABS
50.0000 mg | ORAL_TABLET | Freq: Two times a day (BID) | ORAL | Status: DC
  Administered 2024-10-14 – 2024-10-15 (×2): 50 mg via ORAL
  Filled 2024-10-14 (×2): qty 1

## 2024-10-14 MED ORDER — DEXTROSE 50 % IV SOLN
INTRAVENOUS | Status: AC
  Filled 2024-10-14: qty 50

## 2024-10-14 MED ORDER — SODIUM CHLORIDE 0.9 % IV SOLN: 250.0000 mL | INTRAVENOUS | Status: AC | PRN

## 2024-10-14 MED ORDER — LABETALOL HCL 5 MG/ML IV SOLN: 10.0000 mg | INTRAVENOUS | Status: AC | PRN

## 2024-10-14 MED ORDER — LATANOPROST 0.005 % OP SOLN
1.0000 [drp] | Freq: Every day | OPHTHALMIC | Status: DC
  Administered 2024-10-14: 1 [drp] via OPHTHALMIC
  Filled 2024-10-14 (×2): qty 2.5

## 2024-10-14 MED ORDER — NITROGLYCERIN 1 MG/10 ML FOR IR/CATH LAB
INTRA_ARTERIAL | Status: DC | PRN
  Administered 2024-10-14 (×4): 200 ug via INTRACORONARY

## 2024-10-14 MED ORDER — SODIUM CHLORIDE 0.9% FLUSH
3.0000 mL | Freq: Two times a day (BID) | INTRAVENOUS | Status: DC
  Administered 2024-10-14 – 2024-10-15 (×2): 3 mL via INTRAVENOUS

## 2024-10-14 MED ORDER — HEPARIN (PORCINE) IN NACL 1000-0.9 UT/500ML-% IV SOLN
INTRAVENOUS | Status: DC | PRN
  Administered 2024-10-14: 500 mL
  Administered 2024-10-14: 1000 mL

## 2024-10-14 MED ORDER — ASPIRIN 81 MG PO TBEC
81.0000 mg | DELAYED_RELEASE_TABLET | Freq: Every day | ORAL | Status: DC
  Administered 2024-10-15: 81 mg via ORAL
  Filled 2024-10-14: qty 1

## 2024-10-14 MED ORDER — FENTANYL CITRATE (PF) 100 MCG/2ML IJ SOLN
INTRAMUSCULAR | Status: AC
  Filled 2024-10-14: qty 2

## 2024-10-14 MED ORDER — ROSUVASTATIN CALCIUM 20 MG PO TABS
20.0000 mg | ORAL_TABLET | Freq: Every day | ORAL | Status: DC
  Filled 2024-10-14: qty 1

## 2024-10-14 MED ORDER — HYDRALAZINE HCL 20 MG/ML IJ SOLN: 10.0000 mg | INTRAMUSCULAR | Status: AC | PRN

## 2024-10-14 MED ORDER — HEPARIN SODIUM (PORCINE) 1000 UNIT/ML IJ SOLN
INTRAMUSCULAR | Status: DC | PRN
  Administered 2024-10-14: 5000 [IU] via INTRAVENOUS
  Administered 2024-10-14: 2000 [IU] via INTRAVENOUS
  Administered 2024-10-14: 2500 [IU] via INTRAVENOUS
  Administered 2024-10-14: 2000 [IU] via INTRAVENOUS

## 2024-10-14 MED ORDER — GENTAMICIN SULFATE 0.1 % EX CREA
1.0000 | TOPICAL_CREAM | Freq: Every day | CUTANEOUS | Status: DC
  Filled 2024-10-14: qty 15

## 2024-10-14 MED ORDER — DELFLEX-LC/1.5% DEXTROSE 344 MOSM/L IP SOLN
INTRAPERITONEAL | Status: DC
  Filled 2024-10-14 (×2): qty 3000

## 2024-10-14 MED ORDER — HEPARIN (PORCINE) IN NACL 1000-0.9 UT/500ML-% IV SOLN
INTRAVENOUS | Status: AC
  Filled 2024-10-14: qty 1000

## 2024-10-14 MED ORDER — HEPARIN SODIUM (PORCINE) 5000 UNIT/ML IJ SOLN
5000.0000 [IU] | Freq: Three times a day (TID) | INTRAMUSCULAR | Status: DC
  Administered 2024-10-14 – 2024-10-15 (×2): 5000 [IU] via SUBCUTANEOUS
  Filled 2024-10-14 (×2): qty 1

## 2024-10-14 MED ORDER — FENTANYL CITRATE (PF) 100 MCG/2ML IJ SOLN
INTRAMUSCULAR | Status: DC | PRN
  Administered 2024-10-14: 25 ug via INTRAVENOUS

## 2024-10-14 MED ORDER — SODIUM CHLORIDE 0.9% FLUSH: 3.0000 mL | Freq: Two times a day (BID) | INTRAVENOUS | Status: DC

## 2024-10-14 MED ORDER — ACETAMINOPHEN 325 MG PO TABS: 650.0000 mg | ORAL_TABLET | ORAL | Status: DC | PRN

## 2024-10-14 MED ORDER — LIDOCAINE HCL 1 % IJ SOLN
INTRAMUSCULAR | Status: AC
  Filled 2024-10-14: qty 20

## 2024-10-14 MED ORDER — NITROGLYCERIN 1 MG/10 ML FOR IR/CATH LAB
INTRA_ARTERIAL | Status: AC
  Filled 2024-10-14: qty 10

## 2024-10-14 MED ORDER — MIDAZOLAM HCL 2 MG/2ML IJ SOLN
INTRAMUSCULAR | Status: AC
  Filled 2024-10-14: qty 2

## 2024-10-14 MED ORDER — ONDANSETRON HCL 4 MG/2ML IJ SOLN: 4.0000 mg | Freq: Four times a day (QID) | INTRAMUSCULAR | Status: DC | PRN

## 2024-10-14 MED ORDER — MIDAZOLAM HCL (PF) 2 MG/2ML IJ SOLN
INTRAMUSCULAR | Status: DC | PRN
  Administered 2024-10-14: 1 mg via INTRAVENOUS

## 2024-10-14 MED ORDER — CARVEDILOL 6.25 MG PO TABS
6.2500 mg | ORAL_TABLET | Freq: Two times a day (BID) | ORAL | Status: DC
  Administered 2024-10-14: 6.25 mg via ORAL
  Filled 2024-10-14: qty 1

## 2024-10-14 MED ORDER — ASPIRIN 81 MG PO CHEW: 81.0000 mg | CHEWABLE_TABLET | ORAL | Status: DC

## 2024-10-14 MED ORDER — DELFLEX-LC/2.5% DEXTROSE 394 MOSM/L IP SOLN
INTRAPERITONEAL | Status: DC
  Filled 2024-10-14 (×2): qty 3000

## 2024-10-14 MED ORDER — TACROLIMUS ER 4 MG PO TB24
5.0000 mg | ORAL_TABLET | Freq: Every day | ORAL | Status: DC
  Administered 2024-10-15: 5 mg via ORAL
  Filled 2024-10-14: qty 1

## 2024-10-14 MED ORDER — CLOPIDOGREL BISULFATE 75 MG PO TABS
ORAL_TABLET | ORAL | Status: AC
  Filled 2024-10-14: qty 8

## 2024-10-14 MED ORDER — VERAPAMIL HCL 2.5 MG/ML IV SOLN
INTRAVENOUS | Status: AC
  Filled 2024-10-14: qty 2

## 2024-10-14 MED ORDER — INSULIN PUMP
Freq: Three times a day (TID) | SUBCUTANEOUS | Status: DC
  Filled 2024-10-14: qty 1

## 2024-10-14 MED ORDER — CALCITRIOL 0.25 MCG PO CAPS
0.2500 ug | ORAL_CAPSULE | Freq: Every day | ORAL | Status: DC
  Administered 2024-10-15: 0.25 ug via ORAL
  Filled 2024-10-14 (×2): qty 1

## 2024-10-14 MED ORDER — MONTELUKAST SODIUM 10 MG PO TABS
10.0000 mg | ORAL_TABLET | Freq: Every day | ORAL | Status: DC
  Administered 2024-10-14: 10 mg via ORAL
  Filled 2024-10-14: qty 1

## 2024-10-14 MED ORDER — CLOPIDOGREL BISULFATE 75 MG PO TABS
ORAL_TABLET | ORAL | Status: DC | PRN
  Administered 2024-10-14: 600 mg via ORAL

## 2024-10-14 MED ORDER — SODIUM CHLORIDE 0.9% FLUSH: 3.0000 mL | INTRAVENOUS | Status: DC | PRN

## 2024-10-14 MED ORDER — SODIUM CHLORIDE 0.9 % IV SOLN
250.0000 mL | INTRAVENOUS | Status: DC | PRN
  Administered 2024-10-14: 250 mL via INTRAVENOUS

## 2024-10-14 MED ORDER — CLOPIDOGREL BISULFATE 75 MG PO TABS
75.0000 mg | ORAL_TABLET | Freq: Every day | ORAL | Status: DC
  Administered 2024-10-15: 75 mg via ORAL
  Filled 2024-10-14: qty 1

## 2024-10-14 MED ORDER — IOHEXOL 300 MG/ML  SOLN
INTRAMUSCULAR | Status: DC | PRN
  Administered 2024-10-14: 127 mL

## 2024-10-14 SURGICAL SUPPLY — 18 items
BALLOON MINITREK RX 2.0X12 (BALLOONS) IMPLANT
BALLOON ~~LOC~~ TREK NEO RX 2.5X15 (BALLOONS) IMPLANT
CATH INFINITI 5FR MULTPACK ANG (CATHETERS) IMPLANT
CATH LAUNCHER 5F EBU3.5 (CATHETERS) IMPLANT
DEVICE CLOSURE MYNXGRIP 5F (Vascular Products) IMPLANT
DRAPE BRACHIAL (DRAPES) IMPLANT
GLIDESHEATH SLEND SS 6F .021 (SHEATH) IMPLANT
GUIDEWIRE INQWIRE 1.5J.035X260 (WIRE) IMPLANT
GUIDEWIRE PRESSURE X 175 (WIRE) IMPLANT
KIT ENCORE 26 ADVANTAGE (KITS) IMPLANT
KIT MICROPUNCTURE VSI 5F STIFF (SHEATH) IMPLANT
KIT SYRINGE INJ CVI SPIKEX1 (MISCELLANEOUS) IMPLANT
PACK CARDIAC CATH (CUSTOM PROCEDURE TRAY) ×2 IMPLANT
SET ATX-X65L (MISCELLANEOUS) IMPLANT
SHEATH AVANTI 5FR X 11CM (SHEATH) IMPLANT
STATION PROTECTION PRESSURIZED (MISCELLANEOUS) IMPLANT
STENT ONYX FRONTIER 2.25X26 (Permanent Stent) IMPLANT
WIRE EMERALD 3MM-J .035X150CM (WIRE) IMPLANT

## 2024-10-14 NOTE — Brief Op Note (Signed)
 BRIEF CARDIAC CATHETERIZATION NOTE  10/14/2024  11:07 AM  PATIENT:  Brandi Carroll  58 y.o. female  PRE-OPERATIVE DIAGNOSIS:  Abnormal stress test  POST-OPERATIVE DIAGNOSIS:  Same  PROCEDURE:  Procedure(s): LEFT HEART CATH AND CORONARY ANGIOGRAPHY (Left) CORONARY PRESSURE/FFR STUDY (N/A) CORONARY STENT INTERVENTION (N/A)  SURGEON:  Surgeons and Role:    * Jera Headings, MD - Primary  FINDINGS: Severe single-vessel CAD with sequential 70-80% mid LAD lesions that are hemodynamically significant (RFR 0.78).  50% mid LCx lesion is not significant (RFR  = 0.97). Normal LVEF. Mildly elevated LVEDP (20 mmHg). Successful PCI to sequential 70-80% mid LAD lesions using Onyx Frontier 2.25 x 26 mm DES.  RECOMMENDATIONS: Overnight observation. DAPT with ASA and clopidogrel  for at least 6 months. Aggressive secondary prevention of CAD. Consult nephrology to assist with PD while admitted.  Lonni Hanson, MD [facility]

## 2024-10-14 NOTE — Progress Notes (Signed)
 Patient alert and oriented Xs'4. PD treatment initiated.   10/14/24 2030  Peritoneal Catheter Mid lower abdomen  No placement date or time found.   Catheter Location: Mid lower abdomen  Site Assessment Clean, Dry, Intact  Drainage Description None  Catheter status Accessed;Draining;Patent  Dressing Gauze/Drain sponge  Dressing Status Clean, Dry, Intact  Dressing Intervention New dressing/dressing changed  Cycler Setup  Total Number of Night Cycles 4  Night Fill Volume 2500  Dianeal Solution Dextrose  1.5% in 6000 mL Low Cal/Low Mag (2.5 6000)  Night Dwell Time per Cycle - Hour(s) 1  Night Dwell Time per Cycle - Minute(s) 30  Night Time Therapy - Minute(s) 23  Night Time Therapy - Hour(s) 8  Minimum Initial Drain Volume 0  Maximum Peritoneal Volume 3750  Night/Total Therapy Volume 89999  Day Exchange No  Completion  Treatment Status Complete

## 2024-10-14 NOTE — Progress Notes (Signed)
 PD pt follows [facility]. Navigator following to assist with any HD needs.  Suzen Satchel Dialysis Navigator [phone]

## 2024-10-14 NOTE — Inpatient Diabetes Management (Signed)
 Inpatient Diabetes Program Recommendations  AACE/ADA: New Consensus Statement on Inpatient Glycemic Control (2015)  Target Ranges:  Prepandial:   less than 140 mg/dL      Peak postprandial:   less than 180 mg/dL (1-2 hours)      Critically ill patients:  140 - 180 mg/dL    Latest Reference Range & Units 10/14/24 08:11 10/14/24 09:14 10/14/24 09:46 10/14/24 10:15 10/14/24 11:00  Glucose-Capillary 70 - 99 mg/dL 867 (H) 88 92 87 870 (H)   Review of Glycemic Control  Diabetes history: DM1 Outpatient Diabetes medications: T-Slim Insulin  Pump with Novolog insulin , FreeStyle Libre 2+ CGM Current orders for Inpatient glycemic control: None; in Cath area at this time  Inpatient Diabetes Program Recommendations:    Insulin : Please consider using Insulin  Pump order set to order CBGs and Insulin  Pump AC&HS and 2am.  NOTE: Patient has DM1 and per chart, she sees Dr. Cherilyn (Endocrinologist) and had a video visit on 10/10/24. Per note on 10/10/24, the following should be insulin  pump settings: Basal rates Midnight = 0.50 3 AM= 0.525 8 AM= 0.45 2 PM = 0.425 5 PM = 0.425 6 PM = 0.6 TDD basal: 10.925 units  Bolus settings I/C: 10 at MN, 8 at 5PM ISF: 12A 1:60, 5P 1:50  Target Glucose: 110 (though download says 125) Active insulin  time: 5 hours   Per chart, patient had heart cath today and will be admitted for overnight observation.   Thanks, Earnie Gainer, RN, MSN, CDCES Diabetes Coordinator Inpatient Diabetes Program [phone] (Team Pager from 8am to 5pm)

## 2024-10-14 NOTE — Interval H&P Note (Signed)
 History and Physical Interval Note:  10/14/2024 8:58 AM  Brandi Carroll  has presented today for surgery, with the diagnosis of abnormal stress test.  The various methods of treatment have been discussed with the patient and family. After consideration of risks, benefits and other options for treatment, the patient has consented to  Procedure(s): LEFT HEART CATH AND CORONARY ANGIOGRAPHY (Left) as a surgical intervention.  The patient's history has been reviewed, patient examined, no change in status, stable for surgery.  I have reviewed the patient's chart and labs.  Questions were answered to the patient's satisfaction.    Cath Lab Visit (complete for each Cath Lab visit)  Clinical Evaluation Leading to the Procedure:   ACS: No.  Non-ACS:    Anginal Classification: CCS I  Anti-ischemic medical therapy: Minimal Therapy (1 class of medications)  Non-Invasive Test Results: High-risk stress test findings: cardiac mortality >3%/year  Prior CABG: No previous CABG  Dracen Reigle

## 2024-10-14 NOTE — Consult Note (Signed)
 [facility]  CONSULT NOTE    Date: 10/14/2024                  Patient Name:  Brandi Carroll  MRN: [id number]  DOB: 01-02-1966  Age / Sex: 58 y.o., female         PCP: Diedra Lame, MD                 Service Requesting Consult: Cardiology                 Reason for Consult: End stage renal disease on peritoneal dialysis            History of Present Illness: Ms. Brandi Carroll is a 58 y.o.  female with past medical history of hypertension, type 1 diabetes, hyperlipidemia, orthostatic hypotension, stroke, failed renal transplant, and end-stage renal disease on peritoneal dialysis, who was admitted to [facility] on 10/14/2024 for Abnormal stress test [R94.39]  Patient presents to the hospital for scheduled left heart cath and coronary angiography.  Patient is a current dialysis patient who performs home treatments with peritoneal dialysis.  He is currently followed by [facility] physicians.  Does report completing dialysis last night.  Patient is seen resting in bed, easily aroused.  Alert and oriented.  Currently on room air.  Denies pain or discomfort.  Denies shortness of breath.  Postprocedure labs obtained yesterday, stable for renal patient.  We have been consulted to manage dialysis needs.  Medications: Outpatient medications: Medications Prior to Admission  Medication Sig Dispense Refill Last Dose/Taking   acetaminophen  (TYLENOL ) 500 MG tablet Take 500 mg by mouth every 4 (four) hours as needed for moderate pain (pain score 4-6).   Past Week   Alpha-D-Galactosidase (BEANO PO) Take 1 tablet by mouth 2 (two) times daily as needed (Gas/bloating).   Past Month   aspirin  81 MG tablet Take 81 mg by mouth daily.   10/14/2024 Morning   Biotin 1 MG CAPS Take 1 tablet by mouth daily.   10/13/2024   calcitRIOL  (ROCALTROL ) 0.25 MCG capsule Take 0.25 mcg by mouth daily.   10/13/2024   carvedilol  (COREG ) 6.25 MG tablet Take 6.25 mg by mouth 2 (two) times daily.    10/14/2024 Morning   Cholecalciferol (VITAMIN D3) 2000 UNITS CHEW Chew 1 tablet by mouth daily.   10/13/2024   cyanocobalamin 1000 MCG tablet Take 1,000 mcg by mouth daily.   Taking   fexofenadine (ALLEGRA) 180 MG tablet Take 180 mg by mouth daily.   10/14/2024 Morning   fluticasone (FLONASE) 50 MCG/ACT nasal spray Place 2 sprays into both nostrils daily.   10/14/2024 Morning   furosemide  (LASIX ) 40 MG tablet Take 80 mg by mouth daily.   10/13/2024   Glucagon (BAQSIMI ONE PACK) 3 MG/DOSE POWD 1 spray in one nostril in case of severe hypoglycemia   Taking   insulin  aspart (NOVOLOG) 100 UNIT/ML injection USE UP TO 65 UNITS DAILY IN PUMP.   10/14/2024 Morning   losartan  (COZAAR ) 50 MG tablet Take 50 mg by mouth 2 (two) times daily.   10/14/2024 Morning   LUMIGAN 0.01 % SOLN Place 1 drop into the right eye at bedtime.   10/13/2024 Bedtime   montelukast  (SINGULAIR ) 10 MG tablet Take 10 mg by mouth at bedtime.   10/13/2024 Bedtime   ondansetron  (ZOFRAN -ODT) 4 MG disintegrating tablet Take 4 mg by mouth every 8 (eight) hours as needed.   Past Week   rosuvastatin  (CRESTOR ) 5 MG tablet  Take 5 mg by mouth daily.   10/14/2024 Morning   tacrolimus  ER (ENVARSUS  XR) 1 MG TB24 Take 5 mg by mouth daily before breakfast.   10/13/2024 Morning   valACYclovir (VALTREX) 1000 MG tablet Take 1,000 mg by mouth daily as needed (Flare ups).   Taking As Needed   ferric citrate (AURYXIA) 1 GM 210 MG(Fe) tablet Take 210 mg by mouth 3 (three) times daily. (Patient not taking: Reported on 10/14/2024)   Not Taking   gentamicin  cream (GARAMYCIN ) 0.1 % Apply 1 Application topically daily as needed (Dermatitis). (Patient not taking: Reported on 10/14/2024)   Not Taking    Current medications: Current Facility-Administered Medications  Medication Dose Route Frequency Provider Last Rate Last Admin   0.9 %  sodium chloride  infusion  250 mL Intravenous PRN End, Lonni, MD       acetaminophen  (TYLENOL ) tablet 650 mg  650 mg Oral Q4H PRN  End, Lonni, MD       NOREEN ON 10/15/2024] aspirin  tablet 81 mg  81 mg Oral Daily End, Christopher, MD       calcitRIOL  (ROCALTROL ) capsule 0.25 mcg  0.25 mcg Oral Daily End, Lonni, MD       carvedilol  (COREG ) tablet 6.25 mg  6.25 mg Oral BID End, Lonni, MD       NOREEN ON 10/15/2024] clopidogrel  (PLAVIX ) tablet 75 mg  75 mg Oral Q breakfast End, Lonni, MD       dialysis solution 1.5% low-MG/low-CA dianeal solution   Intraperitoneal Q24H Druscilla Bald, NP       furosemide  (LASIX ) tablet 80 mg  80 mg Oral Daily End, Christopher, MD       gentamicin  cream (GARAMYCIN ) 0.1 % 1 Application  1 Application Topical Daily Lakethia Coppess, NP       heparin  injection 5,000 Units  5,000 Units Subcutaneous Q8H End, Christopher, MD       hydrALAZINE  (APRESOLINE ) injection 10 mg  10 mg Intravenous Q20 Min PRN End, Lonni, MD       insulin  pump   Subcutaneous TID WC, HS, 0200 End, Lonni, MD       labetalol  (NORMODYNE ) injection 10 mg  10 mg Intravenous Q10 min PRN End, Lonni, MD       latanoprost  (XALATAN ) 0.005 % ophthalmic solution 1 drop  1 drop Right Eye QHS End, Christopher, MD       losartan  (COZAAR ) tablet 50 mg  50 mg Oral BID End, Lonni, MD       montelukast  (SINGULAIR ) tablet 10 mg  10 mg Oral QHS End, Christopher, MD       ondansetron  (ZOFRAN ) injection 4 mg  4 mg Intravenous Q6H PRN End, Lonni, MD       NOREEN ON 10/15/2024] rosuvastatin  (CRESTOR ) tablet 20 mg  20 mg Oral Daily End, Christopher, MD       sodium chloride  flush (NS) 0.9 % injection 3 mL  3 mL Intravenous Q12H End, Christopher, MD       sodium chloride  flush (NS) 0.9 % injection 3 mL  3 mL Intravenous PRN End, Lonni, MD       NOREEN ON 10/15/2024] tacrolimus  ER (ENVARSUS  XR) tablet 5 mg  5 mg Oral QAC breakfast End, Lonni, MD          Allergies: Allergies  Allergen Reactions   Erythromycin Nausea And Vomiting   Floxin [Ofloxacin]    Latex    Levofloxacin  Nausea And Vomiting   Moxifloxacin Nausea And Vomiting   Sulfa  Antibiotics Nausea And Vomiting   Tape     ADHESIVE       Past Medical History: Past Medical History:  Diagnosis Date   Chronic sinusitis    CKD (chronic kidney disease), stage IV (HCC)    a. s/p renal transplant.   Diabetes mellitus without complication (HCC)    A1c, 7.0 last check; type 1, has insulin  pump; subsequently has neuropathy;    Diabetic neuropathy (HCC)    Diastolic dysfunction    a. 04/2022 Echo: EF 60-65%, no rwma, GrI DD, nl RV fxn, RVSP 42.67mmHg, mild MR, mild-mod TR.   GERD (gastroesophageal reflux disease)    Hyperlipemia    Hyperthyroidism    Insulin  pump in place    Labile Hypertension    Nausea and vomiting    Obesity    Osteoporosis    Peritoneal dialysis catheter in place 03/2024   Small intestinal bacterial overgrowth (SIBO)    Stroke [facility])    Tremor      Past Surgical History: Past Surgical History:  Procedure Laterality Date   CATARACT EXTRACTION     COLONOSCOPY WITH PROPOFOL  N/A 10/19/2016   Procedure: COLONOSCOPY WITH PROPOFOL ;  Surgeon: Gladis RAYMOND Mariner, MD;  Location: [facility] ENDOSCOPY;  Service: Endoscopy;  Laterality: N/A;   EYE SURGERY     bilateral (cataracts)   FOOT SURGERY     FOOT SURGERY     KIDNEY TRANSPLANT  2007     Family History: Family History  Problem Relation Age of Onset   Parkinson's disease Mother    Breast cancer Maternal Aunt        in lymph nodes     Social History: Social History   Socioeconomic History   Marital status: Married    Spouse name: Not on file   Number of children: Not on file   Years of education: Not on file   Highest education level: Not on file  Occupational History   Not on file  Tobacco Use   Smoking status: Never   Smokeless tobacco: Never  Vaping Use   Vaping status: Never Used  Substance and Sexual Activity   Alcohol use: No   Drug use: No   Sexual activity: Not on file  Other Topics Concern   Not on file   Social History Narrative   Not on file   Social Drivers of Health   Financial Resource Strain: Patient Declined (10/07/2024)   Received from [facility]   Overall Financial Resource Strain (CARDIA)    Difficulty of Paying Living Expenses: Patient declined  Food Insecurity: Patient Declined (10/07/2024)   Received from [facility]   Hunger Vital Sign    Within the past 12 months, you worried that your food would run out before you got the money to buy more.: Patient declined    Within the past 12 months, the food you bought just didn't last and you didn't have money to get more.: Patient declined  Transportation Needs: Patient Declined (10/07/2024)   Received from [facility] - Transportation    In the past 12 months, has lack of transportation kept you from medical appointments or from getting medications?: Patient declined    Lack of Transportation (Non-Medical): Patient declined  Physical Activity: Not on file  Stress: Not on file  Social Connections: Not on file  Intimate Partner Violence: Not on file     Review of Systems: Review of Systems  Constitutional:  Negative for  chills, fever and malaise/fatigue.  HENT:  Negative for congestion, sore throat and tinnitus.   Eyes:  Negative for blurred vision and redness.  Respiratory:  Negative for cough, shortness of breath and wheezing.   Cardiovascular:  Negative for chest pain, palpitations, claudication and leg swelling.  Gastrointestinal:  Negative for abdominal pain, blood in stool, diarrhea, nausea and vomiting.  Genitourinary:  Negative for flank pain, frequency and hematuria.  Musculoskeletal:  Negative for back pain, falls and myalgias.  Skin:  Negative for rash.  Neurological:  Negative for dizziness, weakness and headaches.  Endo/Heme/Allergies:  Does not bruise/bleed easily.  Psychiatric/Behavioral:  Negative for depression. The patient is not  nervous/anxious and does not have insomnia.     Vital Signs: Blood pressure (!) 150/75, pulse 67, temperature 98 F (36.7 C), temperature source Oral, resp. rate 13, height 5' 3 (1.6 m), weight 60 kg, SpO2 97%.  Weight trends: Filed Weights   10/14/24 0834  Weight: 60 kg    Physical Exam: General: NAD  Head: Normocephalic, atraumatic. Moist oral mucosal membranes  Eyes: Anicteric  Lungs:  Clear to auscultation  Heart: Regular rate and rhythm  Abdomen:  Soft, nontender, PDC  Extremities: No peripheral edema.  Neurologic: Alert and oriented  Skin: No lesions  Access: PD catheter     Lab results: Basic Metabolic Panel: Recent Labs  Lab 10/13/24 0821  NA 143  K 4.0  CL 102  CO2 23  GLUCOSE 101*  BUN 71*  CREATININE 9.58*  CALCIUM  8.8    Liver Function Tests: No results for input(s): AST, ALT, ALKPHOS, BILITOT, PROT, ALBUMIN in the last 168 hours. No results for input(s): LIPASE, AMYLASE in the last 168 hours. No results for input(s): AMMONIA in the last 168 hours.  CBC: Recent Labs  Lab 10/13/24 0821  WBC 5.8  HGB 11.8  HCT 35.6  MCV 96  PLT 269    Cardiac Enzymes: No results for input(s): CKTOTAL, CKMB, CKMBINDEX, TROPONINI in the last 168 hours.  BNP: Invalid input(s): POCBNP  CBG: Recent Labs  Lab 10/14/24 0914 10/14/24 0946 10/14/24 1015 10/14/24 1100 10/14/24 1201  GLUCAP 88 92 87 129* 165*    Microbiology: Results for orders placed or performed during the hospital encounter of 10/05/22  Urine Culture     Status: None   Collection Time: 10/05/22  8:22 AM   Specimen: Urine, Clean Catch  Result Value Ref Range Status   Specimen Description   Final    URINE, CLEAN CATCH Performed at [facility], [address] [place], [place] [address]    Special Requests   Final    NONE Performed at [facility], [address], [place], [place] [address]    Culture   Final    NO  GROWTH Performed at [facility], [address] [place], [place] [address]    Report Status 10/06/2022 FINAL  Final    Coagulation Studies: No results for input(s): LABPROT, INR in the last 72 hours.  Urinalysis: No results for input(s): COLORURINE, LABSPEC, PHURINE, GLUCOSEU, HGBUR, BILIRUBINUR, KETONESUR, PROTEINUR, UROBILINOGEN, NITRITE, LEUKOCYTESUR in the last 72 hours.  Invalid input(s): APPERANCEUR    Imaging: No results found.   Assessment & Plan: Ms. Brandi Carroll is a 58 y.o.  female with  past medical history of hypertension, type 1 diabetes, hyperlipidemia, orthostatic hypotension, stroke, failed renal transplant, and end-stage renal disease on peritoneal dialysis, who was admitted to [facility] on 10/14/2024 for Abnormal stress test [R94.39]  Azotemia, abnormal labs. BUN 71. Will continue dialysis for management.  Diabetes mellitus type I with chronic kidney disease/renal manifestations: insulin  dependent. Home regimen includes Novolog. Most recent hemoglobin A1c is 9.6 on 08/13/24  3.  End stage renal disease on peritoneal dialysis. Last treatment received last night. Will continue treatment tonight per outpatient prescription.      LOS: 0 Haeden Hudock 12/9/202512:55 PM

## 2024-10-15 ENCOUNTER — Encounter: Payer: Self-pay | Admitting: Internal Medicine

## 2024-10-15 DIAGNOSIS — E1165 Type 2 diabetes mellitus with hyperglycemia: Secondary | ICD-10-CM | POA: Diagnosis not present

## 2024-10-15 DIAGNOSIS — Z992 Dependence on renal dialysis: Secondary | ICD-10-CM

## 2024-10-15 DIAGNOSIS — R9439 Abnormal result of other cardiovascular function study: Secondary | ICD-10-CM | POA: Diagnosis not present

## 2024-10-15 DIAGNOSIS — E782 Mixed hyperlipidemia: Secondary | ICD-10-CM | POA: Diagnosis not present

## 2024-10-15 DIAGNOSIS — N184 Chronic kidney disease, stage 4 (severe): Secondary | ICD-10-CM

## 2024-10-15 DIAGNOSIS — N186 End stage renal disease: Secondary | ICD-10-CM | POA: Diagnosis not present

## 2024-10-15 DIAGNOSIS — I2510 Atherosclerotic heart disease of native coronary artery without angina pectoris: Secondary | ICD-10-CM | POA: Diagnosis not present

## 2024-10-15 LAB — BASIC METABOLIC PANEL WITH GFR
Anion gap: 15 (ref 5–15)
BUN: 71 mg/dL — ABNORMAL HIGH (ref 6–20)
CO2: 24 mmol/L (ref 22–32)
Calcium: 7.9 mg/dL — ABNORMAL LOW (ref 8.9–10.3)
Chloride: 97 mmol/L — ABNORMAL LOW (ref 98–111)
Creatinine, Ser: 9.13 mg/dL — ABNORMAL HIGH (ref 0.44–1.00)
GFR, Estimated: 5 mL/min — ABNORMAL LOW (ref >60)
Glucose, Bld: 192 mg/dL — ABNORMAL HIGH (ref 70–99)
Potassium: 3.9 mmol/L (ref 3.5–5.1)
Sodium: 136 mmol/L (ref 135–145)

## 2024-10-15 LAB — CBC
HCT: 31.2 % — ABNORMAL LOW (ref 36.0–46.0)
Hemoglobin: 10 g/dL — ABNORMAL LOW (ref 12.0–15.0)
MCH: 31.1 pg (ref 26.0–34.0)
MCHC: 32.1 g/dL (ref 30.0–36.0)
MCV: 96.9 fL (ref 80.0–100.0)
Platelets: 241 K/uL (ref 150–400)
RBC: 3.22 MIL/uL — ABNORMAL LOW (ref 3.87–5.11)
RDW: 14.8 % (ref 11.5–15.5)
WBC: 6.3 K/uL (ref 4.0–10.5)
nRBC: 0 % (ref 0.0–0.2)

## 2024-10-15 LAB — GLUCOSE, CAPILLARY
Glucose-Capillary: 194 mg/dL — ABNORMAL HIGH (ref 70–99)
Glucose-Capillary: 104 mg/dL — ABNORMAL HIGH (ref 70–99)

## 2024-10-15 MED ORDER — ROSUVASTATIN CALCIUM 20 MG PO TABS: 20.0000 mg | ORAL_TABLET | Freq: Every day | ORAL | 1 refills | Status: AC

## 2024-10-15 MED ORDER — ASPIRIN 81 MG PO TBEC: 81.0000 mg | DELAYED_RELEASE_TABLET | Freq: Every day | ORAL | 12 refills | Status: AC

## 2024-10-15 MED ORDER — CARVEDILOL 12.5 MG PO TABS: 12.5000 mg | ORAL_TABLET | Freq: Two times a day (BID) | ORAL | 1 refills | Status: AC

## 2024-10-15 MED ORDER — ROSUVASTATIN CALCIUM 5 MG PO TABS: 5.0000 mg | ORAL_TABLET | Freq: Every day | ORAL | Status: DC

## 2024-10-15 MED ORDER — CLOPIDOGREL BISULFATE 75 MG PO TABS: 75.0000 mg | ORAL_TABLET | Freq: Every day | ORAL | 2 refills | Status: AC

## 2024-10-15 MED ORDER — ROSUVASTATIN CALCIUM 10 MG PO TABS
20.0000 mg | ORAL_TABLET | Freq: Every day | ORAL | Status: DC
  Administered 2024-10-15: 20 mg via ORAL
  Filled 2024-10-15: qty 2

## 2024-10-15 MED ORDER — CARVEDILOL 12.5 MG PO TABS
12.5000 mg | ORAL_TABLET | Freq: Two times a day (BID) | ORAL | Status: DC
  Administered 2024-10-15: 12.5 mg via ORAL
  Filled 2024-10-15: qty 1

## 2024-10-15 NOTE — Discharge Summary (Signed)
 Discharge Summary    Patient ID: Brandi Carroll MRN: [id number]; DOB: [birth year]  Admit date: 10/14/2024 Discharge date: 10/15/2024  Primary Care Provider: Diedra Lame, MD  Primary Cardiologist: Lonni Hanson, MD     Discharge Diagnoses    Principal Problem:   Abnormal stress test Active Problems:   CAD (coronary artery disease)   Diagnostic Studies/Procedures  LHC 10/14/2024    Prox Cx to Mid Cx lesion is 50% stenosed.   Mid LAD-1 lesion is 75% stenosed.   Mid LAD-2 lesion is 75% stenosed.   Prox RCA to Mid RCA lesion is 40% stenosed.   Dist RCA lesion is 20% stenosed.   A drug-eluting stent was successfully placed using a STENT ONYX FRONTIER 2.25X26.   Post intervention, there is a 0% residual stenosis.   Post intervention, there is a 0% residual stenosis.   The left ventricular systolic function is normal.   LV end diastolic pressure is mildly elevated.   The left ventricular ejection fraction is greater than 65% by visual estimate.   There is no aortic valve stenosis.   In the absence of any other complications or medical issues, we expect the patient to be ready for discharge from an interventional cardiology perspective on 10/15/2024.   Recommend uninterrupted dual antiplatelet therapy with Aspirin  81mg  daily and Clopidogrel  75mg  daily for a minimum of 6 months (stable ischemic heart disease-Class I recommendation).   Conclusions: Severe single-vessel coronary artery disease with sequential 70-80% mid LAD lesions that are hemodynamically significant (RFR 0.78).  50% mid LCx lesion is not significant (RFR  = 0.97). Normal left ventricular systolic function (LVEF greater than 65%). Mildly elevated left ventricular filling pressure (LVEDP 20 mmHg). Successful PCI to sequential 70-80% mid LAD lesions using Onyx Frontier 2.25 x 26 mm drug-eluting stent with 0% residual stenosis and TIMI-3 flow.   Recommendations: Overnight observation. Dual  antiplatelet therapy with aspirin  and clopidogrel  for at least 6 months. Aggressive secondary prevention of coronary artery disease. Consult nephrology to assist with peritoneal dialysis while admitted.  Diagnostic Dominance: Right  Intervention    _____________   History of Present Illness     Brandi Carroll is a 58 y.o. female with a past medical history of labile hypertension with orthostatic hypotension, hyperlipidemia, stroke with imbalance, brittle type 1 diabetes, CKD status post failed renal transplant currently undergoing repeat transplant evaluation currently on peritoneal dialysis, anemia of chronic disease, Bell's palsy, OSA, memory loss, tremor, and GERD, who recently had stress testing that was abnormal and presented to [facility] for an elective left heart catheterization on 10/14/2024.  Hospital Course      Ms. Hildreth presented to [facility] on [facility]/14/2024 for an outpatient elective left heart catheterization after undergoing stress testing that was found to be abnormal.  Stress test revealed inferolateral ischemia as well as transient ischemic dilatation of the left ventricle concerning for underlying coronary artery disease she continues to suffer from chronic exertional dyspnea.  The cardiac catheterization was completed on 10/14/2024 via the right femoral.  Revealed severe single-vessel coronary artery disease with sequential 70-80% mid LAD lesions that are hemodynamically significant by RFR at 0.78 and 50% mid left circumflex lesion not significant by RFR of 0.97.  Successful PCI to the sequential 70 to 80% mid LAD lesions using an Onyx frontier 2.25 x 26 mm drug-eluting stent with 0% residual stenosis and TIMI-3 flow. The ventricular ejection fraction was greater than 65% by visual estimate, no aortic valve stenosis.  Recommended that  she continue on uninterrupted dual antiplatelet therapy with aspirin  81 mg daily clopidogrel  75 mg daily for minimum of 6 months.  There were no  immediate postoperative complication she was taken holding and was placed in observation on telemetry overnight.  Vital signs remained stable.  Right groin access site continues to have a clean dry and intact dressing with no bleeding bruising or hematoma and a 2+ femoral pulse.  Patient denied any pain to the site.  Carvedilol  was increased to 12.5 mg twice daily and rosuvastatin  was increased to 20 mg daily.  She was referred to cardiac rehab.  She is also being scheduled for hospital follow-up appointment in 2 weeks.  Did the patient have an acute coronary syndrome (MI, NSTEMI, STEMI, etc) this admission?:  No                               Did the patient have a percutaneous coronary intervention (stent / angioplasty)?:  Yes.     Cath/PCI Registry Performance & Quality Measures: Aspirin  prescribed? - Yes ADP Receptor Inhibitor (Plavix /Clopidogrel , Brilinta/Ticagrelor or Effient/Prasugrel) prescribed (includes medically managed patients)? - Yes High Intensity Statin (Lipitor 40-80mg  or Crestor  20-40mg ) prescribed? - Yes For EF <40%, was ACEI/ARB prescribed? - Not Applicable (EF >/= 40%) For EF <40%, Aldosterone Antagonist (Spironolactone or Eplerenone) prescribed? - Not Applicable (EF >/= 40%) Cardiac Rehab Phase II ordered? - Yes  _____________  Physical Exam   Discharge Vitals Blood pressure (!) 150/70, pulse 71, temperature 99.1 F (37.3 C), resp. rate 17, height 5' 3 (1.6 m), weight 59.3 kg, SpO2 97%.  Filed Weights   10/14/24 0834 10/15/24 0500 10/15/24 0545  Weight: 60 kg 59.2 kg 59.3 kg    GEN: Well nourished, well developed, in no acute distress.  HEENT: Grossly normal.  Neck: Supple, no JVD, carotid bruits, or masses. Cardiac: RRR, no murmurs, rubs, or gallops. No clubbing, cyanosis, edema.  Radials/DP/PT 2+ and equal bilaterally. Right femoral site dressing clean/dry/intact without bleeding, bruising, or hematoma. 2+ right femoral pulse Respiratory:  Respirations regular and  unlabored, clear to auscultation bilaterally. GI: Soft, nontender, nondistended, BS + x 4. MS: no deformity or atrophy. Skin: warm and dry, no rash. Neuro:  Strength and sensation are intact. Psych: AAOx3.  Normal affect.  Labs & Radiologic Studies    CBC Recent Labs    10/14/24 1641 10/15/24 0503  WBC 6.7 6.3  HGB 10.6* 10.0*  HCT 32.8* 31.2*  MCV 95.6 96.9  PLT 232 241   Basic Metabolic Panel Recent Labs    87/91/74 0821 10/14/24 1641 10/15/24 0503  NA 143  --  136  K 4.0  --  3.9  CL 102  --  97*  CO2 23  --  24  GLUCOSE 101*  --  192*  BUN 71*  --  71*  CREATININE 9.58* 9.44* 9.13*  CALCIUM  8.8  --  7.9*   Liver Function Tests No results for input(s): AST, ALT, ALKPHOS, BILITOT, PROT, ALBUMIN in the last 72 hours. No results for input(s): LIPASE, AMYLASE in the last 72 hours. High Sensitivity Troponin:   No results for input(s): TRNPT in the last 720 hours.] BNP Invalid input(s): POCBNP D-Dimer No results for input(s): DDIMER in the last 72 hours. Hemoglobin A1C No results for input(s): HGBA1C in the last 72 hours. Fasting Lipid Panel No results for input(s): CHOL, HDL, LDLCALC, TRIG, CHOLHDL, LDLDIRECT in the last 72 hours. Thyroid Function  Tests No results for input(s): TSH, T4TOTAL, T3FREE, THYROIDAB in the last 72 hours.  Invalid input(s): FREET3 _____________  CARDIAC CATHETERIZATION Result Date: 10/14/2024   Prox Cx to Mid Cx lesion is 50% stenosed.   Mid LAD-1 lesion is 75% stenosed.   Mid LAD-2 lesion is 75% stenosed.   Prox RCA to Mid RCA lesion is 40% stenosed.   Dist RCA lesion is 20% stenosed.   A drug-eluting stent was successfully placed using a STENT ONYX FRONTIER 2.25X26.   Post intervention, there is a 0% residual stenosis.   Post intervention, there is a 0% residual stenosis.   The left ventricular systolic function is normal.   LV end diastolic pressure is mildly elevated.   The left ventricular  ejection fraction is greater than 65% by visual estimate.   There is no aortic valve stenosis.   In the absence of any other complications or medical issues, we expect the patient to be ready for discharge from an interventional cardiology perspective on 10/15/2024.   Recommend uninterrupted dual antiplatelet therapy with Aspirin  81mg  daily and Clopidogrel  75mg  daily for a minimum of 6 months (stable ischemic heart disease-Class I recommendation). Conclusions: Severe single-vessel coronary artery disease with sequential 70-80% mid LAD lesions that are hemodynamically significant (RFR 0.78).  50% mid LCx lesion is not significant (RFR  = 0.97). Normal left ventricular systolic function (LVEF greater than 65%). Mildly elevated left ventricular filling pressure (LVEDP 20 mmHg). Successful PCI to sequential 70-80% mid LAD lesions using Onyx Frontier 2.25 x 26 mm drug-eluting stent with 0% residual stenosis and TIMI-3 flow.  Recommendations: Overnight observation. Dual antiplatelet therapy with aspirin  and clopidogrel  for at least 6 months. Aggressive secondary prevention of coronary artery disease. Consult nephrology to assist with peritoneal dialysis while admitted. Lonni Hanson, MD [facility]  Disposition   Pt is being discharged home today in good condition.  Follow-up Plans & Appointments     Follow-up Information     Abigail Bernardino HERO, PA-C Follow up.   Specialties: Cardiology, Radiology Why: 10/31/2024 at 2:20 PM Contact information: [address] [place] [address] [phone]                Discharge Instructions     AMB Referral to Cardiac Rehabilitation - Phase II   Complete by: As directed    Diagnosis: Coronary Stents   After initial evaluation and assessments completed: Virtual Based Care may be provided alone or in conjunction with Phase 2 Cardiac Rehab based on patient barriers.: Yes   Intensive Cardiac Rehabilitation (ICR) [facility] location only OR  Traditional Cardiac Rehabilitation (TCR) *If criteria for ICR are not met will enroll in TCR [facility] only): Yes   Call MD for:  difficulty breathing, headache or visual disturbances   Complete by: As directed    Call MD for:  extreme fatigue   Complete by: As directed    Call MD for:  persistant nausea and vomiting   Complete by: As directed    Call MD for:  redness, tenderness, or signs of infection (pain, swelling, redness, odor or green/yellow discharge around incision site)   Complete by: As directed    Call MD for:  severe uncontrolled pain   Complete by: As directed    Call MD for:  temperature >100.4   Complete by: As directed    Diet - low sodium heart healthy   Complete by: As directed    Diet renal/carb modified with fluid restriction  Complete by: As directed    Increase activity slowly   Complete by: As directed    Remove dressing in 24 hours   Complete by: As directed    Cleanse area and place band-aid for an additional 24 hours       Discharge Medications   Allergies as of 10/15/2024       Reactions   Erythromycin Nausea And Vomiting   Floxin [ofloxacin]    Latex    Levofloxacin Nausea And Vomiting   Moxifloxacin Nausea And Vomiting   Sulfa Antibiotics Nausea And Vomiting   Tape    ADHESIVE         Medication List     STOP taking these medications    aspirin  81 MG tablet Replaced by: aspirin  EC 81 MG tablet       TAKE these medications    acetaminophen  500 MG tablet Commonly known as: TYLENOL  Take 500 mg by mouth every 4 (four) hours as needed for moderate pain (pain score 4-6).   aspirin  EC 81 MG tablet Take 1 tablet (81 mg total) by mouth daily. Swallow whole. Replaces: aspirin  81 MG tablet   Baqsimi One Pack 3 MG/DOSE Powd Generic drug: Glucagon 1 spray in one nostril in case of severe hypoglycemia   BEANO PO Take 1 tablet by mouth 2 (two) times daily as needed (Gas/bloating).   Biotin 1 MG Caps Take 1 tablet by mouth daily.    calcitRIOL  0.25 MCG capsule Commonly known as: ROCALTROL  Take 0.25 mcg by mouth daily.   carvedilol  12.5 MG tablet Commonly known as: COREG  Take 1 tablet (12.5 mg total) by mouth 2 (two) times daily. What changed:  medication strength how much to take   clopidogrel  75 MG tablet Commonly known as: PLAVIX  Take 1 tablet (75 mg total) by mouth daily with breakfast.   cyanocobalamin 1000 MCG tablet Take 1,000 mcg by mouth daily.   Envarsus  XR 1 MG Tb24 Generic drug: tacrolimus  ER Take 5 mg by mouth daily before breakfast.   ferric citrate 1 GM 210 MG(Fe) tablet Commonly known as: AURYXIA Take 210 mg by mouth 3 (three) times daily.   fexofenadine 180 MG tablet Commonly known as: ALLEGRA Take 180 mg by mouth daily.   fluticasone 50 MCG/ACT nasal spray Commonly known as: FLONASE Place 2 sprays into both nostrils daily.   furosemide  40 MG tablet Commonly known as: LASIX  Take 80 mg by mouth daily.   gentamicin  cream 0.1 % Commonly known as: GARAMYCIN  Apply 1 Application topically daily as needed (Dermatitis).   insulin  aspart 100 UNIT/ML injection Commonly known as: novoLOG USE UP TO 65 UNITS DAILY IN PUMP.   losartan  50 MG tablet Commonly known as: COZAAR  Take 50 mg by mouth 2 (two) times daily.   Lumigan 0.01 % Soln Generic drug: bimatoprost Place 1 drop into the right eye at bedtime.   montelukast  10 MG tablet Commonly known as: SINGULAIR  Take 10 mg by mouth at bedtime.   ondansetron  4 MG disintegrating tablet Commonly known as: ZOFRAN -ODT Take 4 mg by mouth every 8 (eight) hours as needed.   rosuvastatin  20 MG tablet Commonly known as: CRESTOR  Take 1 tablet (20 mg total) by mouth daily. What changed:  medication strength how much to take   valACYclovir 1000 MG tablet Commonly known as: VALTREX Take 1,000 mg by mouth daily as needed (Flare ups).   Vitamin D3 50 MCG (2000 UT) Chew Chew 1 tablet by mouth daily.  Outstanding  Labs/Studies    Duration of Discharge Encounter     Signed, Millette Halberstam, NP 10/15/2024, 10:48 AM

## 2024-10-15 NOTE — Progress Notes (Signed)
°  Peritoneal Dialysis Treatment Initiation Note     Pre Treatment Weight:    Consent signed and in chart.  PD treatment initiated via aseptic technique.    Patient is awake and alert. No complaints of pain.    PD exit site clean, dry and intact.     Hand-off given to the patient's nurse.  Education provided to dept staff  regarding PD machine and how  to contact tech support if machine  alarms.      GEANNIE Sar LPN [facility]

## 2024-10-15 NOTE — Discharge Instructions (Signed)
 Brandi Carroll

## 2024-10-15 NOTE — Progress Notes (Signed)
 [facility]  ROUNDING NOTE   Subjective:   Patient seen sitting up in bed Alert and oriented  Eating breakfast States PD went well overnight  UF 30ml  Objective:  Vital signs in last 24 hours:  Temp:  [98 F (36.7 C)-99.2 F (37.3 C)] 99.1 F (37.3 C) (12/10 0738) Pulse Rate:  [66-73] 71 (12/10 0738) Resp:  [9-20] 17 (12/10 0738) BP: (109-180)/(59-92) 150/70 (12/10 0738) SpO2:  [88 %-100 %] 97 % (12/10 0738) Weight:  [59.2 kg-59.3 kg] 59.3 kg (12/10 0545)  Weight change:  Filed Weights   10/14/24 0834 10/15/24 0500 10/15/24 0545  Weight: 60 kg 59.2 kg 59.3 kg    Intake/Output: I/O last 3 completed shifts: In: 150 [P.O.:150] Out: -    Intake/Output this shift:  No intake/output data recorded.  Physical Exam: General: NAD  Head: Normocephalic, atraumatic. Moist oral mucosal membranes  Eyes: Anicteric  Lungs:  Clear to auscultation, normal effort  Heart: Regular rate and rhythm  Abdomen:  Soft, nontender PDC  Extremities:  No peripheral edema.  Neurologic: Awake, alert, conversant  Skin: Warm,dry, no rash  Access: PD catheter    Basic Metabolic Panel: Recent Labs  Lab 10/13/24 0821 10/14/24 1641 10/15/24 0503  NA 143  --  136  K 4.0  --  3.9  CL 102  --  97*  CO2 23  --  24  GLUCOSE 101*  --  192*  BUN 71*  --  71*  CREATININE 9.58* 9.44* 9.13*  CALCIUM  8.8  --  7.9*    Liver Function Tests: No results for input(s): AST, ALT, ALKPHOS, BILITOT, PROT, ALBUMIN in the last 168 hours. No results for input(s): LIPASE, AMYLASE in the last 168 hours. No results for input(s): AMMONIA in the last 168 hours.  CBC: Recent Labs  Lab 10/13/24 0821 10/14/24 1641 10/15/24 0503  WBC 5.8 6.7 6.3  HGB 11.8 10.6* 10.0*  HCT 35.6 32.8* 31.2*  MCV 96 95.6 96.9  PLT 269 232 241    Cardiac Enzymes: No results for input(s): CKTOTAL, CKMB, CKMBINDEX, TROPONINI in the last 168 hours.  BNP: Invalid input(s):  POCBNP  CBG: Recent Labs  Lab 10/14/24 1300 10/14/24 1428 10/14/24 2216 10/15/24 0231 10/15/24 0742  GLUCAP 136* 196* 229* 194* 104*    Microbiology: Results for orders placed or performed during the hospital encounter of 10/05/22  Urine Culture     Status: None   Collection Time: 10/05/22  8:22 AM   Specimen: Urine, Clean Catch  Result Value Ref Range Status   Specimen Description   Final    URINE, CLEAN CATCH Performed at [facility], [address] [place], [place] [address]    Special Requests   Final    NONE Performed at [facility], [address] [place], [place] [address]    Culture   Final    NO GROWTH Performed at [facility], [address] [place], [place] [address]    Report Status 10/06/2022 FINAL  Final    Coagulation Studies: No results for input(s): LABPROT, INR in the last 72 hours.  Urinalysis: No results for input(s): COLORURINE, LABSPEC, PHURINE, GLUCOSEU, HGBUR, BILIRUBINUR, KETONESUR, PROTEINUR, UROBILINOGEN, NITRITE, LEUKOCYTESUR in the last 72 hours.  Invalid input(s): APPERANCEUR    Imaging: CARDIAC CATHETERIZATION Result Date: 10/14/2024   Prox Cx to Mid Cx lesion is 50% stenosed.   Mid LAD-1 lesion is 75% stenosed.   Mid LAD-2 lesion is 75% stenosed.   Prox  RCA to Mid RCA lesion is 40% stenosed.   Dist RCA lesion is 20% stenosed.   A drug-eluting stent was successfully placed using a STENT ONYX FRONTIER 2.25X26.   Post intervention, there is a 0% residual stenosis.   Post intervention, there is a 0% residual stenosis.   The left ventricular systolic function is normal.   LV end diastolic pressure is mildly elevated.   The left ventricular ejection fraction is greater than 65% by visual estimate.   There is no aortic valve stenosis.   In the absence of any other complications or medical issues, we expect the patient to be ready for discharge from an interventional cardiology  perspective on 10/15/2024.   Recommend uninterrupted dual antiplatelet therapy with Aspirin  81mg  daily and Clopidogrel  75mg  daily for a minimum of 6 months (stable ischemic heart disease-Class I recommendation). Conclusions: Severe single-vessel coronary artery disease with sequential 70-80% mid LAD lesions that are hemodynamically significant (RFR 0.78).  50% mid LCx lesion is not significant (RFR  = 0.97). Normal left ventricular systolic function (LVEF greater than 65%). Mildly elevated left ventricular filling pressure (LVEDP 20 mmHg). Successful PCI to sequential 70-80% mid LAD lesions using Onyx Frontier 2.25 x 26 mm drug-eluting stent with 0% residual stenosis and TIMI-3 flow.  Recommendations: Overnight observation. Dual antiplatelet therapy with aspirin  and clopidogrel  for at least 6 months. Aggressive secondary prevention of coronary artery disease. Consult nephrology to assist with peritoneal dialysis while admitted. Lonni Hanson, MD [facility]    Medications:    dialysis solution 1.5% low-MG/low-CA     dialysis solution 2.5% low-MG/low-CA      aspirin  EC  81 mg Oral Daily   calcitRIOL   0.25 mcg Oral Daily   carvedilol   12.5 mg Oral BID   clopidogrel   75 mg Oral Q breakfast   furosemide   80 mg Oral Daily   gentamicin  cream  1 Application Topical Daily   heparin   5,000 Units Subcutaneous Q8H   insulin  pump   Subcutaneous TID WC, HS, 0200   latanoprost   1 drop Right Eye QHS   losartan   50 mg Oral BID   montelukast   10 mg Oral QHS   rosuvastatin   20 mg Oral Daily   sodium chloride  flush  3 mL Intravenous Q12H   tacrolimus  ER  5 mg Oral QAC breakfast   acetaminophen , ondansetron  (ZOFRAN ) IV, sodium chloride  flush  Assessment/ Plan:  Brandi Carroll is a 58 y.o.  female with past medical history of hypertension, type 1 diabetes, hyperlipidemia, orthostatic hypotension, stroke, failed renal transplant, and end-stage renal disease on peritoneal dialysis, who was  admitted to [facility] on 10/14/2024 for Abnormal stress test [R94.39]  [facility]  Azotemia, abnormal labs. BUN remains 71. Denies uremic symptoms. Will continue dialysis for management.  Diabetes mellitus type I with chronic kidney disease/renal manifestations: insulin  dependent. Home regimen includes Novolog. Most recent hemoglobin A1c is 9.6 on 08/13/24.  Glucose elevated at times. Primary team to continue management.  End stage renal disease on peritoneal dialysis. Last treatment received last night. Will continue treatment tonight per outpatient prescription.  Dialysis received overnight with UF 30ml. Will continue nightly treatments while impatient.      LOS: 0 Brandi Carroll 12/10/202511:34 AM  .

## 2024-10-16 LAB — LIPOPROTEIN A (LPA): Lipoprotein (a): 168.3 nmol/L — ABNORMAL HIGH (ref <75.0)

## 2024-10-29 NOTE — Progress Notes (Unsigned)
 "  Cardiology Office Note    Date:  10/31/2024   ID:  Brandi, Carroll 10/12/1966, MRN [id number]  PCP:  Brandi Lame, MD  Cardiologist:  Brandi Hanson, MD  Electrophysiologist:  None   Chief Complaint: Follow-up  History of Present Illness:   Brandi Carroll is a 58 y.o. female with history of CAD status post PCI/DES to the mid LAD in 10/2024, labile HTN with orthostatic hypotension, HLD, stroke with imbalance, brittle type 1 diabetes mellitus, CKD s/p failing renal transplant currently undergoing repeat transplant evaluation currently on peritoneal dialysis, anemia of chronic disease, Bell's palsy, OSA, memory loss, tremor, and GERD who presents for follow-up of LHC.  She established care in our office in 06/2021 for evaluation of chest and left arm pain.  However, that visit was unable to be completed because she became unresponsive in the office and was found to be severely hypoglycemic in the setting of having not eaten much and continued her basal insulin  pump infusion.  She was given oral and IV glucose with prompt improvement.  She was taken to the ED for further evaluation, though left without being seen by a provider.  Subsequent echo in 04/2022 demonstrated an EF of 60 to 65%, no regional wall motion abnormalities, grade 1 diastolic dysfunction, normal RV systolic function and ventricular cavity size, mildly elevated PASP estimated at 42.7 mmHg, mild mitral regurgitation, mild to moderate tricuspid regurgitation, and an estimated right atrial pressure of 8 mmHg.  She was admitted to [facility] in 06/2022 with DKA and hypotension prompting discontinuation of antihypertensive regimen with subsequent reinitiation of pharmacotherapy and dosage adjustments at follow-up visits thereafter.   In the setting of evaluation for repeat kidney transplant to [facility], she underwent echo in 06/2023 that showed an EF greater than 55%, mild mitral regurgitation, mildly to moderately dilated left  atrium, normal RV systolic function and ventricular cavity size, mild to moderate tricuspid regurgitation, small pericardial effusion posterior to the myocardium, and moderate to severe pulmonary hypertension.  Lexiscan MPI in 06/2023 showed no significant ischemia with LVEF greater than 60% and mild coronary artery calcifications.   She was admitted to [facility] in 02/2024 with severe anemia of chronic disease trending from 10 to 5 in the preceding 4 months without gross blood loss and was treated with 2 units PRBC and recommendation to follow-up with nephrology and GI as an outpatient.   As part of her kidney transplant workup, she underwent echo in 08/2024 that showed an EF greater than 55%, no regional wall motion abnormalities, mildly dilated left atrium, and normal RV systolic function and ventricular cavity size.  Lexiscan MPI in 08/2024 was high risk with a medium size, moderate in severity completely reversible defect involving the apex, apical lateral, mid inferolateral, and basal inferolateral segments concerning for ischemia without evidence of fixed defect to suggest a scar.  Mild transient LV dilatation noted with stress.  LVEF greater than 60%.  In this setting, she was evaluated by Dr. Hanson in 09/2024 reporting chronic exertional dyspnea without frank chest pain.  Subsequent LHC on 10/14/2024 demonstrated severe single-vessel CAD with sequential 70 to 80% mid LAD lesions that were hemodynamically significant with an RFR of 0.78 as well as 50% mid LCx stenosis that was not IFR significant at 0.97, normal LVEF, and mildly elevated LVEDP at 20 mmHg.  She underwent successful PCI/DES to sequential 70 to 80% mid LAD lesions.  She comes in today and is without symptoms of angina or cardiac decompensation.  Since undergoing PCI she reports a sensation of grogginess.  She does not describe this as a fatigue or increased shortness of breath.  She does not feel like there has been any change in her shortness of  breath.  No frank chest pain.  She noted has some bruising along the medial aspect of the right lower extremity following arteriotomy that is improving.  No active bleeding.  No flank bruising.  No hematochezia or melena.  No dizziness, presyncope, or syncope.  Blood pressure is improving on carvedilol  12.5 mg twice daily and losartan  50 mg twice daily.   Labs independently reviewed: 10/2024 - LP(a) 168.3, Hgb 10.0, PLT 241, potassium 3.9, BUN 71, serum creatinine 9.13 08/2024 - albumin 3.6, AST/ALT normal, TC 150, TG 166, HDL 44, LDL 81, A1c 9.1, TSH normal  Past Medical History:  Diagnosis Date   Chronic sinusitis    CKD (chronic kidney disease), stage IV (HCC)    a. s/p renal transplant.   Diabetes mellitus without complication (HCC)    A1c, 7.0 last check; type 1, has insulin  pump; subsequently has neuropathy;    Diabetic neuropathy (HCC)    Diastolic dysfunction    a. 04/2022 Echo: EF 60-65%, no rwma, GrI DD, nl RV fxn, RVSP 42.48mmHg, mild MR, mild-mod TR.   GERD (gastroesophageal reflux disease)    Hyperlipemia    Hyperthyroidism    Insulin  pump in place    Labile Hypertension    Nausea and vomiting    Obesity    Osteoporosis    Peritoneal dialysis catheter in place 03/2024   Small intestinal bacterial overgrowth (SIBO)    Stroke [facility])    Tremor     Past Surgical History:  Procedure Laterality Date   CATARACT EXTRACTION     COLONOSCOPY WITH PROPOFOL  N/A 10/19/2016   Procedure: COLONOSCOPY WITH PROPOFOL ;  Surgeon: Gladis RAYMOND Mariner, MD;  Location: [facility] ENDOSCOPY;  Service: Endoscopy;  Laterality: N/A;   CORONARY PRESSURE/FFR STUDY N/A 10/14/2024   Procedure: CORONARY PRESSURE/FFR STUDY;  Surgeon: Mady Bruckner, MD;  Location: [facility] INVASIVE CV LAB;  Service: Cardiovascular;  Laterality: N/A;   CORONARY STENT INTERVENTION N/A 10/14/2024   Procedure: CORONARY STENT INTERVENTION;  Surgeon: Mady Bruckner, MD;  Location: [facility] INVASIVE CV LAB;  Service: Cardiovascular;   Laterality: N/A;   EYE SURGERY     bilateral (cataracts)   FOOT SURGERY     FOOT SURGERY     KIDNEY TRANSPLANT  2007   LEFT HEART CATH AND CORONARY ANGIOGRAPHY Left 10/14/2024   Procedure: LEFT HEART CATH AND CORONARY ANGIOGRAPHY;  Surgeon: Mady Bruckner, MD;  Location: [facility] INVASIVE CV LAB;  Service: Cardiovascular;  Laterality: Left;    Current Medications: Active Medications[1]  Allergies:   Erythromycin, Floxin [ofloxacin], Latex, Levofloxacin, Moxifloxacin, Sulfa antibiotics, and Tape   Social History   Socioeconomic History   Marital status: Married    Spouse name: Not on file   Number of children: Not on file   Years of education: Not on file   Highest education level: Not on file  Occupational History   Not on file  Tobacco Use   Smoking status: Never   Smokeless tobacco: Never  Vaping Use   Vaping status: Never Used  Substance and Sexual Activity   Alcohol use: No   Drug use: No   Sexual activity: Not on file  Other Topics Concern   Not on file  Social History Narrative   Not on file   Social Drivers of Health  Tobacco Use: Low Risk (10/31/2024)   Patient History    Smoking Tobacco Use: Never    Smokeless Tobacco Use: Never    Passive Exposure: Not on file  Financial Resource Strain: Patient Declined (10/07/2024)   Received from [facility]   Overall Financial Resource Strain (CARDIA)    Difficulty of Paying Living Expenses: Patient declined  Food Insecurity: No Food Insecurity (10/14/2024)   Epic    Worried About Running Out of Food in the Last Year: Never true    Ran Out of Food in the Last Year: Never true  Transportation Needs: No Transportation Needs (10/14/2024)   Epic    Lack of Transportation (Medical): No    Lack of Transportation (Non-Medical): No  Physical Activity: Not on file  Stress: Not on file  Social Connections: Not on file  Depression (EYV7-0): Not on file  Alcohol Screen: Not on file  Housing: Low Risk  (10/14/2024)   Epic    Unable to Pay for Housing in the Last Year: No    Number of Times Moved in the Last Year: 0    Homeless in the Last Year: No  Utilities: Not At Risk (10/14/2024)   Epic    Threatened with loss of utilities: No  Health Literacy: Not on file     Family History:  The patient's family history includes Breast cancer in her maternal aunt; Parkinson's disease in her mother.  ROS:   12-point review of systems is negative unless otherwise noted in the HPI.   EKGs/Labs/Other Studies Reviewed:    Studies reviewed were summarized above. The additional studies were reviewed today:  LHC 10/14/2024:   Prox Cx to Mid Cx lesion is 50% stenosed.   Mid LAD-1 lesion is 75% stenosed.   Mid LAD-2 lesion is 75% stenosed.   Prox RCA to Mid RCA lesion is 40% stenosed.   Dist RCA lesion is 20% stenosed.   A drug-eluting stent was successfully placed using a STENT ONYX FRONTIER 2.25X26.   Post intervention, there is a 0% residual stenosis.   Post intervention, there is a 0% residual stenosis.   The left ventricular systolic function is normal.   LV end diastolic pressure is mildly elevated.   The left ventricular ejection fraction is greater than 65% by visual estimate.   There is no aortic valve stenosis.   In the absence of any other complications or medical issues, we expect the patient to be ready for discharge from an interventional cardiology perspective on 10/15/2024.   Recommend uninterrupted dual antiplatelet therapy with Aspirin  81mg  daily and Clopidogrel  75mg  daily for a minimum of 6 months (stable ischemic heart disease-Class I recommendation).   Conclusions: Severe single-vessel coronary artery disease with sequential 70-80% mid LAD lesions that are hemodynamically significant (RFR 0.78).  50% mid LCx lesion is not significant (RFR  = 0.97). Normal left ventricular systolic function (LVEF greater than 65%). Mildly elevated left ventricular filling pressure (LVEDP 20  mmHg). Successful PCI to sequential 70-80% mid LAD lesions using Onyx Frontier 2.25 x 26 mm drug-eluting stent with 0% residual stenosis and TIMI-3 flow.   Recommendations: Overnight observation. Dual antiplatelet therapy with aspirin  and clopidogrel  for at least 6 months. Aggressive secondary prevention of coronary artery disease. Consult nephrology to assist with peritoneal dialysis while admitted. __________  Morris MPI 08/22/2024 [facility]): - Abnormal high risk study. More specifically, there is a medium sized, moderate in severity, completely reversible perfusion defect involving the apex, apical lateral, mid inferolateral and  basal inferolateral segments. This is most concerning for ischemia. No fixed defect to suggest scar.  - Mild transient LV dilation was noted with stress. While this finding is non-specific, it can be associated with so called diffuse balanced ischemia. Additional differential includes diffuse subendocardial microvascular ischemia and physical dilation of the cavity after stress.  - Left ventricular systolic function is normal. Post stress the ejection fraction is > 60%.  - Coronary calcifications are noted.   -Similar prominence of left axillary nodes when compared to exam completed 06/11/2023.  - Small hiatal hernia.  - Air-fluid level within the esophagus consistent with either poor esophageal clearance and/or reflux.  -Diffuse body wall edema.  __________  2D echo 08/22/2024 [facility]): Summary   1. The left ventricle is normal in size with normal wall thickness.    2. The left ventricular systolic function is normal with no regional wall  motion abnormalities, LVEF is visually estimated at > 55%.    3. The left atrium is mildly dilated in size.    4. The right ventricle is normal in size, with normal systolic function.   Lexiscan MPI 06/11/2023 [facility]): - Probably normal myocardial perfusion study  - No significant ischemia noted  - There is a very small, subtle,  fixed perfusion defect involving the  apical lateral segment. This is consistent with probable artifact (motion  and misregistration of attenuation CT).  - Mild coronary calcifications are noted  - Left ventricular systolic function is hyperdynamic. Post stress the  ejection fraction is > 60%.  - Noted on attenuation CT is non-specific diffuse ground glass appearance  of the lungs  __________   2D echo 06/11/2023 [facility]): Summary   1. The left ventricle is normal in size with normal wall thickness.    2. The left ventricular systolic function is normal, LVEF is visually  estimated at > 55%.    3. There is mild mitral valve regurgitation.    4. The left atrium is mildly to moderately dilated in size.    5. The right ventricle is normal in size, with normal systolic function.    6. There is mild to moderate tricuspid regurgitation.    7. There is moderate-severe pulmonary hypertension.    8. There is a small, posterior pericardial effusion.    9. IVC size and inspiratory change suggest elevated right atrial pressure.  (10-20 mmHg).  __________   2D echo 04/14/2022: 1. Left ventricular ejection fraction, by estimation, is 60 to 65%. The  left ventricle has normal function. The left ventricle has no regional  wall motion abnormalities. Left ventricular diastolic parameters are  consistent with Grade I diastolic  dysfunction (impaired relaxation). The average left ventricular global  longitudinal strain is -16.5 %.   2. Right ventricular systolic function is normal. The right ventricular  size is normal. There is mildly elevated pulmonary artery systolic  pressure. The estimated right ventricular systolic pressure is 42.7 mmHg.   3. The mitral valve is normal in structure. Mild mitral valve  regurgitation. No evidence of mitral stenosis.   4. Tricuspid valve regurgitation is mild to moderate.   5. The aortic valve is tricuspid. Aortic valve regurgitation is not  visualized. No aortic  stenosis is present.   6. The inferior vena cava is normal in size with <50% respiratory  variability, suggesting right atrial pressure of 8 mmHg.   EKG:  EKG is ordered today.  The EKG ordered today demonstrates NSR, 72 bpm, no acute st/t changes  Recent  Labs: 11/30/2023: Magnesium 1.6 10/15/2024: BUN 71; Creatinine, Ser 9.13; Hemoglobin 10.0; Platelets 241; Potassium 3.9; Sodium 136  Recent Lipid Panel    Component Value Date/Time   CHOL 160 10/19/2018 0831   CHOL 145 01/30/2015 0754   TRIG 43 10/19/2018 0831   TRIG 65 01/30/2015 0754   HDL 74 10/19/2018 0831   HDL 57 01/30/2015 0754   CHOLHDL 2.2 10/19/2018 0831   VLDL 9 10/19/2018 0831   VLDL 13 01/30/2015 0754   LDLCALC 77 10/19/2018 0831   LDLCALC 75 01/30/2015 0754   LDLDIRECT 74 07/13/2018 0828    PHYSICAL EXAM:    VS:  BP (!) 140/74   Pulse 72   Ht 5' 3.45 (1.612 m)   Wt 133 lb 3.2 oz (60.4 kg)   SpO2 100%   BMI 23.26 kg/m   BMI: Body mass index is 23.26 kg/m.  Physical Exam Vitals reviewed.  Constitutional:      Appearance: She is well-developed.  HENT:     Head: Normocephalic and atraumatic.  Eyes:     General:        Right eye: No discharge.        Left eye: No discharge.  Cardiovascular:     Rate and Rhythm: Normal rate and regular rhythm.     Heart sounds: Normal heart sounds, S1 normal and S2 normal. Heart sounds not distant. No midsystolic click and no opening snap. No murmur heard.    No friction rub.     Comments: Right femoral arteriotomy without significant tenderness to palpation, active bleeding, swelling, warmth, erythema, or bruit.  Soft, resolving ecchymosis noted along the medial aspect of the right thigh. Pulmonary:     Effort: Pulmonary effort is normal. No respiratory distress.     Breath sounds: Normal breath sounds. No decreased breath sounds, wheezing, rhonchi or rales.  Musculoskeletal:     Cervical back: Normal range of motion.     Right lower leg: No edema.     Left lower  leg: No edema.  Skin:    General: Skin is warm and dry.     Nails: There is no clubbing.  Neurological:     Mental Status: She is alert and oriented to person, place, and time.  Psychiatric:        Speech: Speech normal.        Behavior: Behavior normal.        Thought Content: Thought content normal.        Judgment: Judgment normal.     Wt Readings from Last 3 Encounters:  10/31/24 133 lb 3.2 oz (60.4 kg)  10/15/24 130 lb 11.2 oz (59.3 kg)  09/17/24 127 lb 12.8 oz (58 kg)     ASSESSMENT & PLAN:   CAD involving the native coronary arteries without angina: No symptoms suggestive of angina or cardiac decompensation.  Status post PCI/DES to the mid LAD on 10/14/2024.  Continue DAPT with aspirin  81 mg and clopidogrel  75 mg daily without interruption for a minimum of 6 months from date of PCI.  Obtain CBC given ecchymosis noted.  Okay for cardiac rehab.  Labile hypertension with orthostatic hypotension and chronic dizziness: Blood pressure mildly elevated in the office today.  No changes were made in current pharmacotherapy given labile history.  Ongoing management through PCP and nephrology team.  HLD: LDL 81 in 08/2024 with target less than 55 given established ASCVD and underlying type 1 diabetes.  For now, remains on rosuvastatin  20 mg.  In follow-up,  if LDL remains above goal consider discussion of PCSK9 inhibitor.  CKD stage V status post renal transplant with subsequent failure on peritoneal dialysis with anemia of chronic disease: Check CBC now that she is on DAPT and with ecchymosis following arteriotomy.  Continue to follow-up with nephrology and transplant team at [facility].  Type 1 diabetes: A1c 9.1 in 08/2024.  Followed by endocrinology.  History of CVA: No new focal deficits.  Remains on antiplatelet and statin therapy as outlined above.     Disposition: F/u with Dr. Mady or an APP in 2 months.   Medication Adjustments/Labs and Tests Ordered: Current medicines are reviewed  at length with the patient today.  Concerns regarding medicines are outlined above. Medication changes, Labs and Tests ordered today are summarized above and listed in the Patient Instructions accessible in Encounters.   Signed, Bernardino Bring, PA-C 10/31/2024 3:27 PM     [facility] [address] [place], [place] [address] [phone]     [1]  Current Meds  Medication Sig   acetaminophen  (TYLENOL ) 500 MG tablet Take 500 mg by mouth every 4 (four) hours as needed for moderate pain (pain score 4-6).   Alpha-D-Galactosidase (BEANO PO) Take 1 tablet by mouth 2 (two) times daily as needed (Gas/bloating).   aspirin  EC 81 MG tablet Take 1 tablet (81 mg total) by mouth daily. Swallow whole.   Biotin 1 MG CAPS Take 1 tablet by mouth daily.   calcitRIOL  (ROCALTROL ) 0.25 MCG capsule Take 0.25 mcg by mouth daily.   carvedilol  (COREG ) 12.5 MG tablet Take 1 tablet (12.5 mg total) by mouth 2 (two) times daily.   Cholecalciferol (VITAMIN D3) 2000 UNITS CHEW Chew 1 tablet by mouth daily.   clopidogrel  (PLAVIX ) 75 MG tablet Take 1 tablet (75 mg total) by mouth daily with breakfast.   cyanocobalamin 1000 MCG tablet Take 1,000 mcg by mouth daily.   ferric citrate (AURYXIA) 1 GM 210 MG(Fe) tablet Take 210 mg by mouth 3 (three) times daily.   fexofenadine (ALLEGRA) 180 MG tablet Take 180 mg by mouth daily.   fluticasone (FLONASE) 50 MCG/ACT nasal spray Place 2 sprays into both nostrils daily.   furosemide  (LASIX ) 40 MG tablet Take 80 mg by mouth daily.   gentamicin  cream (GARAMYCIN ) 0.1 % Apply 1 Application topically daily as needed (Dermatitis).   Glucagon (BAQSIMI ONE PACK) 3 MG/DOSE POWD 1 spray in one nostril in case of severe hypoglycemia   insulin  aspart (NOVOLOG) 100 UNIT/ML injection USE UP TO 65 UNITS DAILY IN PUMP.   losartan  (COZAAR ) 50 MG tablet Take 50 mg by mouth 2 (two) times daily.   LUMIGAN 0.01 % SOLN Place 1 drop into the right eye at bedtime.    montelukast  (SINGULAIR ) 10 MG tablet Take 10 mg by mouth at bedtime.   ondansetron  (ZOFRAN -ODT) 4 MG disintegrating tablet Take 4 mg by mouth every 8 (eight) hours as needed.   rosuvastatin  (CRESTOR ) 20 MG tablet Take 1 tablet (20 mg total) by mouth daily.   tacrolimus  ER (ENVARSUS  XR) 1 MG TB24 Take 5 mg by mouth daily before breakfast.   valACYclovir (VALTREX) 1000 MG tablet Take 1,000 mg by mouth daily as needed (Flare ups).   "

## 2024-10-31 ENCOUNTER — Ambulatory Visit: Attending: Physician Assistant | Admitting: Physician Assistant

## 2024-10-31 ENCOUNTER — Encounter: Payer: Self-pay | Admitting: Physician Assistant

## 2024-10-31 VITALS — BP 140/74 | HR 72 | Ht 63.45 in | Wt 133.2 lb

## 2024-10-31 DIAGNOSIS — I2510 Atherosclerotic heart disease of native coronary artery without angina pectoris: Secondary | ICD-10-CM

## 2024-10-31 DIAGNOSIS — E785 Hyperlipidemia, unspecified: Secondary | ICD-10-CM

## 2024-10-31 DIAGNOSIS — Z940 Kidney transplant status: Secondary | ICD-10-CM

## 2024-10-31 DIAGNOSIS — R42 Dizziness and giddiness: Secondary | ICD-10-CM

## 2024-10-31 DIAGNOSIS — Z8673 Personal history of transient ischemic attack (TIA), and cerebral infarction without residual deficits: Secondary | ICD-10-CM

## 2024-10-31 DIAGNOSIS — D638 Anemia in other chronic diseases classified elsewhere: Secondary | ICD-10-CM | POA: Diagnosis present

## 2024-10-31 DIAGNOSIS — Z955 Presence of coronary angioplasty implant and graft: Secondary | ICD-10-CM | POA: Diagnosis not present

## 2024-10-31 DIAGNOSIS — E108 Type 1 diabetes mellitus with unspecified complications: Secondary | ICD-10-CM | POA: Diagnosis present

## 2024-10-31 DIAGNOSIS — Z992 Dependence on renal dialysis: Secondary | ICD-10-CM

## 2024-10-31 DIAGNOSIS — R0989 Other specified symptoms and signs involving the circulatory and respiratory systems: Secondary | ICD-10-CM

## 2024-10-31 DIAGNOSIS — I951 Orthostatic hypotension: Secondary | ICD-10-CM

## 2024-10-31 DIAGNOSIS — N185 Chronic kidney disease, stage 5: Secondary | ICD-10-CM

## 2024-10-31 NOTE — Patient Instructions (Signed)
 Medication Instructions:  Your physician recommends that you continue on your current medications as directed. Please refer to the Current Medication list given to you today.  *If you need a refill on your cardiac medications before your next appointment, please call your pharmacy*  Lab Work: Your provider would like for you to have following labs drawn today CBC.     Testing/Procedures: No test ordered today   Follow-Up: At [facility], you and your health needs are our priority.  As part of our continuing mission to provide you with exceptional heart care, our providers are all part of one team.  This team includes your primary Cardiologist (physician) and Advanced Practice Providers or APPs (Physician Assistants and Nurse Practitioners) who all work together to provide you with the care you need, when you need it.  Your next appointment:   2 month(s)  Provider:   Bernardino Bring, PA-C

## 2024-11-01 LAB — CBC
Hematocrit: 33.4 % — ABNORMAL LOW (ref 34.0–46.6)
Hemoglobin: 10.8 g/dL — ABNORMAL LOW (ref 11.1–15.9)
MCH: 30.4 pg (ref 26.6–33.0)
MCHC: 32.3 g/dL (ref 31.5–35.7)
MCV: 94 fL (ref 79–97)
Platelets: 167 x10E3/uL (ref 150–450)
RBC: 3.55 x10E6/uL — ABNORMAL LOW (ref 3.77–5.28)
RDW: 13.2 % (ref 11.7–15.4)
WBC: 5.9 x10E3/uL (ref 3.4–10.8)

## 2024-11-02 ENCOUNTER — Ambulatory Visit: Payer: Self-pay | Admitting: Physician Assistant

## 2024-12-11 ENCOUNTER — Encounter

## 2024-12-18 ENCOUNTER — Encounter

## 2024-12-30 ENCOUNTER — Ambulatory Visit: Admitting: Physician Assistant
